# Patient Record
Sex: Female | Born: 1945 | Hispanic: Yes | Smoking: Never smoker
Health system: Southern US, Community
[De-identification: ages and names within clinical notes are randomized; demographics above are authoritative.]

## PROBLEM LIST (undated history)

## (undated) DIAGNOSIS — R011 Cardiac murmur, unspecified: Secondary | ICD-10-CM

## (undated) DIAGNOSIS — R519 Headache, unspecified: Secondary | ICD-10-CM

## (undated) DIAGNOSIS — I10 Essential (primary) hypertension: Secondary | ICD-10-CM

## (undated) DIAGNOSIS — M1990 Unspecified osteoarthritis, unspecified site: Secondary | ICD-10-CM

## (undated) DIAGNOSIS — E079 Disorder of thyroid, unspecified: Secondary | ICD-10-CM

## (undated) HISTORY — DX: Essential (primary) hypertension: I10

## (undated) HISTORY — PX: PILONIDAL CYST EXCISION: SHX744

## (undated) HISTORY — PX: TONSILLECTOMY: SUR1361

## (undated) HISTORY — DX: Disorder of thyroid, unspecified: E07.9

## (undated) HISTORY — PX: BREAST EXCISIONAL BIOPSY: SUR124

## (undated) HISTORY — PX: CARPAL TUNNEL RELEASE: SHX101

## (undated) HISTORY — PX: THYROID CYST EXCISION: SHX2511

---

## 2009-04-25 ENCOUNTER — Encounter: Admission: RE | Admit: 2009-04-25 | Discharge: 2009-04-25 | Payer: Self-pay | Admitting: Obstetrics and Gynecology

## 2010-07-30 ENCOUNTER — Encounter: Admission: RE | Admit: 2010-07-30 | Discharge: 2010-07-30 | Payer: Self-pay | Admitting: Obstetrics and Gynecology

## 2010-10-11 ENCOUNTER — Encounter: Admission: RE | Admit: 2010-10-11 | Discharge: 2010-10-11 | Payer: Self-pay | Admitting: Obstetrics and Gynecology

## 2010-10-22 ENCOUNTER — Encounter
Admission: RE | Admit: 2010-10-22 | Discharge: 2010-10-22 | Payer: Self-pay | Source: Home / Self Care | Attending: Obstetrics and Gynecology | Admitting: Obstetrics and Gynecology

## 2010-10-31 ENCOUNTER — Ambulatory Visit (HOSPITAL_COMMUNITY)
Admission: RE | Admit: 2010-10-31 | Discharge: 2010-10-31 | Payer: Self-pay | Source: Home / Self Care | Attending: Surgery | Admitting: Surgery

## 2011-01-21 LAB — BASIC METABOLIC PANEL
CO2: 30 mEq/L (ref 19–32)
Creatinine, Ser: 0.69 mg/dL (ref 0.4–1.2)
GFR calc Af Amer: >60 mL/min (ref >60)
GFR calc non Af Amer: >60 mL/min (ref >60)
Glucose, Bld: 94 mg/dL (ref 70–99)
Sodium: 145 mEq/L (ref 135–145)

## 2011-01-21 LAB — CBC
Hemoglobin: 13.9 g/dL (ref 12.0–15.0)
WBC: 3.7 10*3/uL — ABNORMAL LOW (ref 4.0–10.5)

## 2011-06-27 ENCOUNTER — Other Ambulatory Visit: Payer: Self-pay | Admitting: Obstetrics and Gynecology

## 2011-06-27 DIAGNOSIS — Z1231 Encounter for screening mammogram for malignant neoplasm of breast: Secondary | ICD-10-CM

## 2011-08-02 ENCOUNTER — Ambulatory Visit
Admission: RE | Admit: 2011-08-02 | Discharge: 2011-08-02 | Disposition: A | Discharge status: Home / Self Care | Payer: Medicare Other | Source: Ambulatory Visit | Attending: Obstetrics and Gynecology | Admitting: Obstetrics and Gynecology

## 2011-08-02 DIAGNOSIS — Z1231 Encounter for screening mammogram for malignant neoplasm of breast: Secondary | ICD-10-CM

## 2011-11-19 DIAGNOSIS — M810 Age-related osteoporosis without current pathological fracture: Secondary | ICD-10-CM | POA: Diagnosis not present

## 2011-12-02 ENCOUNTER — Ambulatory Visit
Admission: RE | Admit: 2011-12-02 | Discharge: 2011-12-02 | Disposition: A | Discharge status: Home / Self Care | Payer: Medicare Other | Source: Ambulatory Visit | Attending: Obstetrics and Gynecology | Admitting: Obstetrics and Gynecology

## 2011-12-02 ENCOUNTER — Other Ambulatory Visit: Payer: Self-pay | Admitting: Obstetrics and Gynecology

## 2011-12-02 DIAGNOSIS — M47814 Spondylosis without myelopathy or radiculopathy, thoracic region: Secondary | ICD-10-CM | POA: Diagnosis not present

## 2011-12-02 DIAGNOSIS — M899 Disorder of bone, unspecified: Secondary | ICD-10-CM | POA: Diagnosis not present

## 2011-12-02 DIAGNOSIS — M7989 Other specified soft tissue disorders: Secondary | ICD-10-CM

## 2011-12-02 DIAGNOSIS — M949 Disorder of cartilage, unspecified: Secondary | ICD-10-CM | POA: Diagnosis not present

## 2011-12-10 DIAGNOSIS — M79609 Pain in unspecified limb: Secondary | ICD-10-CM | POA: Diagnosis not present

## 2011-12-10 DIAGNOSIS — M25579 Pain in unspecified ankle and joints of unspecified foot: Secondary | ICD-10-CM | POA: Diagnosis not present

## 2012-02-04 ENCOUNTER — Other Ambulatory Visit (INDEPENDENT_AMBULATORY_CARE_PROVIDER_SITE_OTHER): Payer: Medicare Other

## 2012-02-04 DIAGNOSIS — E559 Vitamin D deficiency, unspecified: Secondary | ICD-10-CM

## 2012-04-23 DIAGNOSIS — R21 Rash and other nonspecific skin eruption: Secondary | ICD-10-CM | POA: Diagnosis not present

## 2012-04-23 DIAGNOSIS — L255 Unspecified contact dermatitis due to plants, except food: Secondary | ICD-10-CM | POA: Diagnosis not present

## 2012-07-14 ENCOUNTER — Other Ambulatory Visit: Payer: Self-pay | Admitting: Obstetrics and Gynecology

## 2012-07-14 DIAGNOSIS — Z1231 Encounter for screening mammogram for malignant neoplasm of breast: Secondary | ICD-10-CM

## 2012-08-17 ENCOUNTER — Ambulatory Visit
Admission: RE | Admit: 2012-08-17 | Discharge: 2012-08-17 | Disposition: A | Discharge status: Home / Self Care | Payer: Medicare Other | Source: Ambulatory Visit | Attending: Obstetrics and Gynecology | Admitting: Obstetrics and Gynecology

## 2012-08-17 DIAGNOSIS — Z1231 Encounter for screening mammogram for malignant neoplasm of breast: Secondary | ICD-10-CM | POA: Diagnosis not present

## 2012-08-17 DIAGNOSIS — Z23 Encounter for immunization: Secondary | ICD-10-CM | POA: Diagnosis not present

## 2012-08-17 DIAGNOSIS — I10 Essential (primary) hypertension: Secondary | ICD-10-CM | POA: Diagnosis not present

## 2012-08-17 DIAGNOSIS — F411 Generalized anxiety disorder: Secondary | ICD-10-CM | POA: Diagnosis not present

## 2012-08-20 ENCOUNTER — Encounter: Payer: Self-pay | Admitting: Obstetrics and Gynecology

## 2012-09-22 ENCOUNTER — Ambulatory Visit: Payer: Medicare Other | Admitting: Obstetrics and Gynecology

## 2012-10-20 DIAGNOSIS — I10 Essential (primary) hypertension: Secondary | ICD-10-CM | POA: Diagnosis not present

## 2012-10-20 DIAGNOSIS — R079 Chest pain, unspecified: Secondary | ICD-10-CM | POA: Diagnosis not present

## 2012-10-20 DIAGNOSIS — R002 Palpitations: Secondary | ICD-10-CM | POA: Diagnosis not present

## 2012-10-21 ENCOUNTER — Other Ambulatory Visit (HOSPITAL_COMMUNITY): Payer: Self-pay | Admitting: Internal Medicine

## 2012-10-21 DIAGNOSIS — R079 Chest pain, unspecified: Secondary | ICD-10-CM

## 2012-10-21 DIAGNOSIS — R002 Palpitations: Secondary | ICD-10-CM

## 2012-10-21 DIAGNOSIS — I10 Essential (primary) hypertension: Secondary | ICD-10-CM

## 2012-10-27 ENCOUNTER — Ambulatory Visit (HOSPITAL_COMMUNITY)
Admission: RE | Admit: 2012-10-27 | Discharge: 2012-10-27 | Disposition: A | Discharge status: Home / Self Care | Payer: Medicare Other | Source: Ambulatory Visit | Attending: Internal Medicine | Admitting: Internal Medicine

## 2012-10-27 DIAGNOSIS — R002 Palpitations: Secondary | ICD-10-CM | POA: Diagnosis not present

## 2012-10-27 DIAGNOSIS — I10 Essential (primary) hypertension: Secondary | ICD-10-CM

## 2012-10-27 DIAGNOSIS — R079 Chest pain, unspecified: Secondary | ICD-10-CM | POA: Diagnosis not present

## 2012-10-27 DIAGNOSIS — R011 Cardiac murmur, unspecified: Secondary | ICD-10-CM | POA: Diagnosis not present

## 2012-11-02 DIAGNOSIS — I10 Essential (primary) hypertension: Secondary | ICD-10-CM | POA: Diagnosis not present

## 2013-02-15 DIAGNOSIS — I10 Essential (primary) hypertension: Secondary | ICD-10-CM | POA: Diagnosis not present

## 2013-02-15 DIAGNOSIS — B0089 Other herpesviral infection: Secondary | ICD-10-CM | POA: Diagnosis not present

## 2013-02-15 DIAGNOSIS — F411 Generalized anxiety disorder: Secondary | ICD-10-CM | POA: Diagnosis not present

## 2013-02-15 DIAGNOSIS — Z0000 Encounter for general adult medical examination without abnormal findings: Secondary | ICD-10-CM | POA: Diagnosis not present

## 2013-02-15 DIAGNOSIS — E559 Vitamin D deficiency, unspecified: Secondary | ICD-10-CM | POA: Diagnosis not present

## 2013-03-04 ENCOUNTER — Encounter: Payer: Self-pay | Admitting: Cardiovascular Disease

## 2013-06-08 DIAGNOSIS — L989 Disorder of the skin and subcutaneous tissue, unspecified: Secondary | ICD-10-CM | POA: Diagnosis not present

## 2013-06-08 DIAGNOSIS — B86 Scabies: Secondary | ICD-10-CM | POA: Diagnosis not present

## 2013-07-23 DIAGNOSIS — M810 Age-related osteoporosis without current pathological fracture: Secondary | ICD-10-CM | POA: Diagnosis not present

## 2013-07-23 DIAGNOSIS — F411 Generalized anxiety disorder: Secondary | ICD-10-CM | POA: Diagnosis not present

## 2013-07-23 DIAGNOSIS — Z23 Encounter for immunization: Secondary | ICD-10-CM | POA: Diagnosis not present

## 2013-07-23 DIAGNOSIS — I10 Essential (primary) hypertension: Secondary | ICD-10-CM | POA: Diagnosis not present

## 2014-01-20 DIAGNOSIS — Z23 Encounter for immunization: Secondary | ICD-10-CM | POA: Diagnosis not present

## 2014-01-20 DIAGNOSIS — I10 Essential (primary) hypertension: Secondary | ICD-10-CM | POA: Diagnosis not present

## 2014-01-20 DIAGNOSIS — M810 Age-related osteoporosis without current pathological fracture: Secondary | ICD-10-CM | POA: Diagnosis not present

## 2014-01-27 DIAGNOSIS — I10 Essential (primary) hypertension: Secondary | ICD-10-CM | POA: Diagnosis not present

## 2014-01-27 DIAGNOSIS — F411 Generalized anxiety disorder: Secondary | ICD-10-CM | POA: Diagnosis not present

## 2014-01-27 DIAGNOSIS — M810 Age-related osteoporosis without current pathological fracture: Secondary | ICD-10-CM | POA: Diagnosis not present

## 2014-01-27 DIAGNOSIS — J309 Allergic rhinitis, unspecified: Secondary | ICD-10-CM | POA: Diagnosis not present

## 2014-02-22 DIAGNOSIS — Z1231 Encounter for screening mammogram for malignant neoplasm of breast: Secondary | ICD-10-CM | POA: Diagnosis not present

## 2014-02-22 DIAGNOSIS — Z803 Family history of malignant neoplasm of breast: Secondary | ICD-10-CM | POA: Diagnosis not present

## 2014-06-02 DIAGNOSIS — M79609 Pain in unspecified limb: Secondary | ICD-10-CM | POA: Diagnosis not present

## 2014-06-02 DIAGNOSIS — I8310 Varicose veins of unspecified lower extremity with inflammation: Secondary | ICD-10-CM | POA: Diagnosis not present

## 2014-06-09 DIAGNOSIS — M79609 Pain in unspecified limb: Secondary | ICD-10-CM | POA: Diagnosis not present

## 2014-06-09 DIAGNOSIS — I8310 Varicose veins of unspecified lower extremity with inflammation: Secondary | ICD-10-CM | POA: Diagnosis not present

## 2014-06-13 DIAGNOSIS — M79609 Pain in unspecified limb: Secondary | ICD-10-CM | POA: Diagnosis not present

## 2014-06-13 DIAGNOSIS — I8310 Varicose veins of unspecified lower extremity with inflammation: Secondary | ICD-10-CM | POA: Diagnosis not present

## 2014-08-03 DIAGNOSIS — Z23 Encounter for immunization: Secondary | ICD-10-CM | POA: Diagnosis not present

## 2014-08-25 DIAGNOSIS — M810 Age-related osteoporosis without current pathological fracture: Secondary | ICD-10-CM | POA: Diagnosis not present

## 2014-08-25 DIAGNOSIS — I10 Essential (primary) hypertension: Secondary | ICD-10-CM | POA: Diagnosis not present

## 2014-08-31 DIAGNOSIS — M810 Age-related osteoporosis without current pathological fracture: Secondary | ICD-10-CM | POA: Diagnosis not present

## 2014-08-31 DIAGNOSIS — I10 Essential (primary) hypertension: Secondary | ICD-10-CM | POA: Diagnosis not present

## 2014-08-31 DIAGNOSIS — F419 Anxiety disorder, unspecified: Secondary | ICD-10-CM | POA: Diagnosis not present

## 2014-08-31 DIAGNOSIS — J309 Allergic rhinitis, unspecified: Secondary | ICD-10-CM | POA: Diagnosis not present

## 2014-09-08 DIAGNOSIS — M79646 Pain in unspecified finger(s): Secondary | ICD-10-CM | POA: Diagnosis not present

## 2014-09-08 DIAGNOSIS — M65311 Trigger thumb, right thumb: Secondary | ICD-10-CM | POA: Diagnosis not present

## 2014-09-27 DIAGNOSIS — Z01411 Encounter for gynecological examination (general) (routine) with abnormal findings: Secondary | ICD-10-CM | POA: Diagnosis not present

## 2014-09-27 DIAGNOSIS — R3915 Urgency of urination: Secondary | ICD-10-CM | POA: Diagnosis not present

## 2014-09-27 DIAGNOSIS — B009 Herpesviral infection, unspecified: Secondary | ICD-10-CM | POA: Diagnosis not present

## 2014-09-27 DIAGNOSIS — R350 Frequency of micturition: Secondary | ICD-10-CM | POA: Diagnosis not present

## 2014-09-27 DIAGNOSIS — Z124 Encounter for screening for malignant neoplasm of cervix: Secondary | ICD-10-CM | POA: Diagnosis not present

## 2014-11-25 DIAGNOSIS — H04123 Dry eye syndrome of bilateral lacrimal glands: Secondary | ICD-10-CM | POA: Diagnosis not present

## 2014-11-25 DIAGNOSIS — H2513 Age-related nuclear cataract, bilateral: Secondary | ICD-10-CM | POA: Diagnosis not present

## 2015-02-23 DIAGNOSIS — I10 Essential (primary) hypertension: Secondary | ICD-10-CM | POA: Diagnosis not present

## 2015-02-23 DIAGNOSIS — Z0000 Encounter for general adult medical examination without abnormal findings: Secondary | ICD-10-CM | POA: Diagnosis not present

## 2015-02-23 DIAGNOSIS — Z1389 Encounter for screening for other disorder: Secondary | ICD-10-CM | POA: Diagnosis not present

## 2015-02-23 DIAGNOSIS — E785 Hyperlipidemia, unspecified: Secondary | ICD-10-CM | POA: Diagnosis not present

## 2015-02-27 DIAGNOSIS — M810 Age-related osteoporosis without current pathological fracture: Secondary | ICD-10-CM | POA: Diagnosis not present

## 2015-02-27 DIAGNOSIS — I10 Essential (primary) hypertension: Secondary | ICD-10-CM | POA: Diagnosis not present

## 2015-03-02 DIAGNOSIS — M810 Age-related osteoporosis without current pathological fracture: Secondary | ICD-10-CM | POA: Diagnosis not present

## 2015-03-02 DIAGNOSIS — J309 Allergic rhinitis, unspecified: Secondary | ICD-10-CM | POA: Diagnosis not present

## 2015-03-02 DIAGNOSIS — E785 Hyperlipidemia, unspecified: Secondary | ICD-10-CM | POA: Diagnosis not present

## 2015-03-02 DIAGNOSIS — F419 Anxiety disorder, unspecified: Secondary | ICD-10-CM | POA: Diagnosis not present

## 2015-03-02 DIAGNOSIS — I10 Essential (primary) hypertension: Secondary | ICD-10-CM | POA: Diagnosis not present

## 2015-03-23 DIAGNOSIS — Z1231 Encounter for screening mammogram for malignant neoplasm of breast: Secondary | ICD-10-CM | POA: Diagnosis not present

## 2015-03-23 DIAGNOSIS — Z803 Family history of malignant neoplasm of breast: Secondary | ICD-10-CM | POA: Diagnosis not present

## 2015-05-19 DIAGNOSIS — R197 Diarrhea, unspecified: Secondary | ICD-10-CM | POA: Diagnosis not present

## 2015-05-19 DIAGNOSIS — B0089 Other herpesviral infection: Secondary | ICD-10-CM | POA: Diagnosis not present

## 2015-05-19 DIAGNOSIS — K30 Functional dyspepsia: Secondary | ICD-10-CM | POA: Diagnosis not present

## 2015-05-23 DIAGNOSIS — R197 Diarrhea, unspecified: Secondary | ICD-10-CM | POA: Diagnosis not present

## 2015-08-16 DIAGNOSIS — Z23 Encounter for immunization: Secondary | ICD-10-CM | POA: Diagnosis not present

## 2015-08-16 DIAGNOSIS — M1990 Unspecified osteoarthritis, unspecified site: Secondary | ICD-10-CM | POA: Diagnosis not present

## 2015-08-16 DIAGNOSIS — L02429 Furuncle of limb, unspecified: Secondary | ICD-10-CM | POA: Diagnosis not present

## 2015-08-21 DIAGNOSIS — M7701 Medial epicondylitis, right elbow: Secondary | ICD-10-CM | POA: Diagnosis not present

## 2015-08-21 DIAGNOSIS — M6530 Trigger finger, unspecified finger: Secondary | ICD-10-CM | POA: Diagnosis not present

## 2015-08-25 DIAGNOSIS — E785 Hyperlipidemia, unspecified: Secondary | ICD-10-CM | POA: Diagnosis not present

## 2015-08-25 DIAGNOSIS — M810 Age-related osteoporosis without current pathological fracture: Secondary | ICD-10-CM | POA: Diagnosis not present

## 2015-08-25 DIAGNOSIS — I10 Essential (primary) hypertension: Secondary | ICD-10-CM | POA: Diagnosis not present

## 2015-09-01 DIAGNOSIS — D709 Neutropenia, unspecified: Secondary | ICD-10-CM | POA: Diagnosis not present

## 2015-09-01 DIAGNOSIS — E785 Hyperlipidemia, unspecified: Secondary | ICD-10-CM | POA: Diagnosis not present

## 2015-09-01 DIAGNOSIS — N182 Chronic kidney disease, stage 2 (mild): Secondary | ICD-10-CM | POA: Diagnosis not present

## 2015-09-01 DIAGNOSIS — I129 Hypertensive chronic kidney disease with stage 1 through stage 4 chronic kidney disease, or unspecified chronic kidney disease: Secondary | ICD-10-CM | POA: Diagnosis not present

## 2015-10-11 DIAGNOSIS — J069 Acute upper respiratory infection, unspecified: Secondary | ICD-10-CM | POA: Diagnosis not present

## 2015-10-11 DIAGNOSIS — R05 Cough: Secondary | ICD-10-CM | POA: Diagnosis not present

## 2015-10-13 DIAGNOSIS — N951 Menopausal and female climacteric states: Secondary | ICD-10-CM | POA: Diagnosis not present

## 2015-10-13 DIAGNOSIS — Z6826 Body mass index (BMI) 26.0-26.9, adult: Secondary | ICD-10-CM | POA: Diagnosis not present

## 2015-10-13 DIAGNOSIS — B009 Herpesviral infection, unspecified: Secondary | ICD-10-CM | POA: Diagnosis not present

## 2015-10-13 DIAGNOSIS — R3915 Urgency of urination: Secondary | ICD-10-CM | POA: Diagnosis not present

## 2015-10-13 DIAGNOSIS — N9419 Other specified dyspareunia: Secondary | ICD-10-CM | POA: Diagnosis not present

## 2015-10-13 DIAGNOSIS — Z01419 Encounter for gynecological examination (general) (routine) without abnormal findings: Secondary | ICD-10-CM | POA: Diagnosis not present

## 2015-10-13 DIAGNOSIS — N952 Postmenopausal atrophic vaginitis: Secondary | ICD-10-CM | POA: Diagnosis not present

## 2015-11-16 DIAGNOSIS — M545 Low back pain: Secondary | ICD-10-CM | POA: Diagnosis not present

## 2015-11-16 DIAGNOSIS — M62830 Muscle spasm of back: Secondary | ICD-10-CM | POA: Diagnosis not present

## 2015-11-16 DIAGNOSIS — W19XXXA Unspecified fall, initial encounter: Secondary | ICD-10-CM | POA: Diagnosis not present

## 2015-11-16 DIAGNOSIS — R109 Unspecified abdominal pain: Secondary | ICD-10-CM | POA: Diagnosis not present

## 2015-11-16 DIAGNOSIS — S3991XA Unspecified injury of abdomen, initial encounter: Secondary | ICD-10-CM | POA: Diagnosis not present

## 2015-11-23 DIAGNOSIS — N952 Postmenopausal atrophic vaginitis: Secondary | ICD-10-CM | POA: Diagnosis not present

## 2015-11-23 DIAGNOSIS — L659 Nonscarring hair loss, unspecified: Secondary | ICD-10-CM | POA: Diagnosis not present

## 2015-11-23 DIAGNOSIS — R3915 Urgency of urination: Secondary | ICD-10-CM | POA: Diagnosis not present

## 2015-11-30 DIAGNOSIS — M545 Low back pain: Secondary | ICD-10-CM | POA: Diagnosis not present

## 2015-12-15 DIAGNOSIS — L649 Androgenic alopecia, unspecified: Secondary | ICD-10-CM | POA: Diagnosis not present

## 2016-01-24 DIAGNOSIS — M7701 Medial epicondylitis, right elbow: Secondary | ICD-10-CM | POA: Diagnosis not present

## 2016-01-24 DIAGNOSIS — M7551 Bursitis of right shoulder: Secondary | ICD-10-CM | POA: Diagnosis not present

## 2016-01-24 DIAGNOSIS — M6530 Trigger finger, unspecified finger: Secondary | ICD-10-CM | POA: Diagnosis not present

## 2016-01-24 DIAGNOSIS — M1990 Unspecified osteoarthritis, unspecified site: Secondary | ICD-10-CM | POA: Diagnosis not present

## 2016-01-31 DIAGNOSIS — N390 Urinary tract infection, site not specified: Secondary | ICD-10-CM | POA: Diagnosis not present

## 2016-01-31 DIAGNOSIS — R300 Dysuria: Secondary | ICD-10-CM | POA: Diagnosis not present

## 2016-02-07 DIAGNOSIS — M810 Age-related osteoporosis without current pathological fracture: Secondary | ICD-10-CM | POA: Diagnosis not present

## 2016-02-07 DIAGNOSIS — M7552 Bursitis of left shoulder: Secondary | ICD-10-CM | POA: Diagnosis not present

## 2016-02-07 DIAGNOSIS — M1990 Unspecified osteoarthritis, unspecified site: Secondary | ICD-10-CM | POA: Diagnosis not present

## 2016-02-27 DIAGNOSIS — I129 Hypertensive chronic kidney disease with stage 1 through stage 4 chronic kidney disease, or unspecified chronic kidney disease: Secondary | ICD-10-CM | POA: Diagnosis not present

## 2016-02-27 DIAGNOSIS — M810 Age-related osteoporosis without current pathological fracture: Secondary | ICD-10-CM | POA: Diagnosis not present

## 2016-02-27 DIAGNOSIS — Z1389 Encounter for screening for other disorder: Secondary | ICD-10-CM | POA: Diagnosis not present

## 2016-02-27 DIAGNOSIS — E785 Hyperlipidemia, unspecified: Secondary | ICD-10-CM | POA: Diagnosis not present

## 2016-02-27 DIAGNOSIS — E663 Overweight: Secondary | ICD-10-CM | POA: Diagnosis not present

## 2016-02-27 DIAGNOSIS — Z0000 Encounter for general adult medical examination without abnormal findings: Secondary | ICD-10-CM | POA: Diagnosis not present

## 2016-03-05 DIAGNOSIS — R350 Frequency of micturition: Secondary | ICD-10-CM | POA: Diagnosis not present

## 2016-03-05 DIAGNOSIS — E785 Hyperlipidemia, unspecified: Secondary | ICD-10-CM | POA: Diagnosis not present

## 2016-03-05 DIAGNOSIS — N182 Chronic kidney disease, stage 2 (mild): Secondary | ICD-10-CM | POA: Diagnosis not present

## 2016-03-05 DIAGNOSIS — D709 Neutropenia, unspecified: Secondary | ICD-10-CM | POA: Diagnosis not present

## 2016-03-05 DIAGNOSIS — M810 Age-related osteoporosis without current pathological fracture: Secondary | ICD-10-CM | POA: Diagnosis not present

## 2016-03-05 DIAGNOSIS — I129 Hypertensive chronic kidney disease with stage 1 through stage 4 chronic kidney disease, or unspecified chronic kidney disease: Secondary | ICD-10-CM | POA: Diagnosis not present

## 2016-06-18 DIAGNOSIS — M19012 Primary osteoarthritis, left shoulder: Secondary | ICD-10-CM | POA: Diagnosis not present

## 2016-06-18 DIAGNOSIS — M7552 Bursitis of left shoulder: Secondary | ICD-10-CM | POA: Diagnosis not present

## 2016-06-18 DIAGNOSIS — G5600 Carpal tunnel syndrome, unspecified upper limb: Secondary | ICD-10-CM | POA: Diagnosis not present

## 2016-06-18 DIAGNOSIS — M19041 Primary osteoarthritis, right hand: Secondary | ICD-10-CM | POA: Diagnosis not present

## 2016-06-18 DIAGNOSIS — M1990 Unspecified osteoarthritis, unspecified site: Secondary | ICD-10-CM | POA: Diagnosis not present

## 2016-06-18 DIAGNOSIS — M19011 Primary osteoarthritis, right shoulder: Secondary | ICD-10-CM | POA: Diagnosis not present

## 2016-06-18 DIAGNOSIS — M19042 Primary osteoarthritis, left hand: Secondary | ICD-10-CM | POA: Diagnosis not present

## 2016-06-18 DIAGNOSIS — M5032 Other cervical disc degeneration, mid-cervical region, unspecified level: Secondary | ICD-10-CM | POA: Diagnosis not present

## 2016-06-25 DIAGNOSIS — M25511 Pain in right shoulder: Secondary | ICD-10-CM | POA: Diagnosis not present

## 2016-06-25 DIAGNOSIS — M25512 Pain in left shoulder: Secondary | ICD-10-CM | POA: Diagnosis not present

## 2016-06-25 DIAGNOSIS — G5600 Carpal tunnel syndrome, unspecified upper limb: Secondary | ICD-10-CM | POA: Diagnosis not present

## 2016-06-25 DIAGNOSIS — M1990 Unspecified osteoarthritis, unspecified site: Secondary | ICD-10-CM | POA: Diagnosis not present

## 2016-06-25 DIAGNOSIS — M7552 Bursitis of left shoulder: Secondary | ICD-10-CM | POA: Diagnosis not present

## 2016-06-26 DIAGNOSIS — H04123 Dry eye syndrome of bilateral lacrimal glands: Secondary | ICD-10-CM | POA: Diagnosis not present

## 2016-06-26 DIAGNOSIS — H2513 Age-related nuclear cataract, bilateral: Secondary | ICD-10-CM | POA: Diagnosis not present

## 2016-07-25 ENCOUNTER — Ambulatory Visit (INDEPENDENT_AMBULATORY_CARE_PROVIDER_SITE_OTHER): Payer: Self-pay | Admitting: Neurology

## 2016-07-25 ENCOUNTER — Ambulatory Visit (INDEPENDENT_AMBULATORY_CARE_PROVIDER_SITE_OTHER): Payer: Medicare Other | Admitting: Neurology

## 2016-07-25 DIAGNOSIS — M542 Cervicalgia: Secondary | ICD-10-CM

## 2016-07-25 DIAGNOSIS — M25512 Pain in left shoulder: Principal | ICD-10-CM

## 2016-07-25 DIAGNOSIS — Z0289 Encounter for other administrative examinations: Secondary | ICD-10-CM

## 2016-07-25 DIAGNOSIS — M25511 Pain in right shoulder: Secondary | ICD-10-CM | POA: Insufficient documentation

## 2016-07-25 NOTE — Progress Notes (Signed)
EMG nerve conduction study report on the procedure section

## 2016-07-25 NOTE — Procedures (Signed)
   NCS (NERVE CONDUCTION STUDY) WITH EMG (ELECTROMYOGRAPHY) REPORT   STUDY DATE: July 25 2016 PATIENT NAME: Tracy Horne DOB: 1945-11-27 MRN: [id number]    TECHNOLOGIST: Laretta Alstrom ELECTROMYOGRAPHER: Marcial Pacas M.D.  CLINICAL INFORMATION:  70 years old right-handed female presented with intermittent neck pain, bilateral shoulder pain  On examination: Bilateral upper extremity motor examinations were normal, deep tendon reflexes were normal and symmetric.  FINDINGS: NERVE CONDUCTION STUDY: Bilateral median, ulnar sensory and motor responses were normal.  NEEDLE ELECTROMYOGRAPHY: Selective needle examinations were performed at bilateral upper extremity muscles, and bilateral cervical paraspinal muscles.  Needle examination of bilateral biceps, triceps, deltoid, pronator teres, right extensor digitorum communis were normal.  There was no spontaneous activity at bilateral cervical paraspinal muscles, C5, C6, C7.  IMPRESSION:  This is a normal study, there is no electrodiagnostic study of bilateral cervical radiculopathy or bilateral upper extremity neuropathy.   INTERPRETING PHYSICIAN:   Marcial Pacas M.D. Ph.D. [facility] [address] [place], [place] [address] [phone]

## 2016-07-26 DIAGNOSIS — Z23 Encounter for immunization: Secondary | ICD-10-CM | POA: Diagnosis not present

## 2016-07-29 DIAGNOSIS — M7552 Bursitis of left shoulder: Secondary | ICD-10-CM | POA: Diagnosis not present

## 2016-07-29 DIAGNOSIS — M1990 Unspecified osteoarthritis, unspecified site: Secondary | ICD-10-CM | POA: Diagnosis not present

## 2016-07-29 DIAGNOSIS — M545 Low back pain: Secondary | ICD-10-CM | POA: Diagnosis not present

## 2016-08-28 DIAGNOSIS — E785 Hyperlipidemia, unspecified: Secondary | ICD-10-CM | POA: Diagnosis not present

## 2016-08-28 DIAGNOSIS — E559 Vitamin D deficiency, unspecified: Secondary | ICD-10-CM | POA: Diagnosis not present

## 2016-08-28 DIAGNOSIS — I129 Hypertensive chronic kidney disease with stage 1 through stage 4 chronic kidney disease, or unspecified chronic kidney disease: Secondary | ICD-10-CM | POA: Diagnosis not present

## 2016-08-28 DIAGNOSIS — I10 Essential (primary) hypertension: Secondary | ICD-10-CM | POA: Diagnosis not present

## 2016-08-28 DIAGNOSIS — M810 Age-related osteoporosis without current pathological fracture: Secondary | ICD-10-CM | POA: Diagnosis not present

## 2016-09-04 DIAGNOSIS — E7800 Pure hypercholesterolemia, unspecified: Secondary | ICD-10-CM | POA: Diagnosis not present

## 2016-09-04 DIAGNOSIS — M810 Age-related osteoporosis without current pathological fracture: Secondary | ICD-10-CM | POA: Diagnosis not present

## 2016-09-04 DIAGNOSIS — I10 Essential (primary) hypertension: Secondary | ICD-10-CM | POA: Diagnosis not present

## 2016-09-04 DIAGNOSIS — R002 Palpitations: Secondary | ICD-10-CM | POA: Diagnosis not present

## 2016-09-17 DIAGNOSIS — M810 Age-related osteoporosis without current pathological fracture: Secondary | ICD-10-CM | POA: Diagnosis not present

## 2016-09-20 DIAGNOSIS — I10 Essential (primary) hypertension: Secondary | ICD-10-CM | POA: Diagnosis not present

## 2016-09-20 DIAGNOSIS — E7800 Pure hypercholesterolemia, unspecified: Secondary | ICD-10-CM | POA: Diagnosis not present

## 2016-09-20 DIAGNOSIS — R002 Palpitations: Secondary | ICD-10-CM | POA: Diagnosis not present

## 2016-10-15 DIAGNOSIS — J329 Chronic sinusitis, unspecified: Secondary | ICD-10-CM | POA: Diagnosis not present

## 2016-11-06 DIAGNOSIS — M1990 Unspecified osteoarthritis, unspecified site: Secondary | ICD-10-CM | POA: Diagnosis not present

## 2016-11-06 DIAGNOSIS — M25511 Pain in right shoulder: Secondary | ICD-10-CM | POA: Diagnosis not present

## 2016-11-06 DIAGNOSIS — M7552 Bursitis of left shoulder: Secondary | ICD-10-CM | POA: Diagnosis not present

## 2016-11-06 DIAGNOSIS — R768 Other specified abnormal immunological findings in serum: Secondary | ICD-10-CM | POA: Diagnosis not present

## 2016-11-06 DIAGNOSIS — M545 Low back pain: Secondary | ICD-10-CM | POA: Diagnosis not present

## 2017-01-17 DIAGNOSIS — M9903 Segmental and somatic dysfunction of lumbar region: Secondary | ICD-10-CM | POA: Diagnosis not present

## 2017-01-17 DIAGNOSIS — M9902 Segmental and somatic dysfunction of thoracic region: Secondary | ICD-10-CM | POA: Diagnosis not present

## 2017-01-17 DIAGNOSIS — M6240 Contracture of muscle, unspecified site: Secondary | ICD-10-CM | POA: Diagnosis not present

## 2017-01-17 DIAGNOSIS — M791 Myalgia: Secondary | ICD-10-CM | POA: Diagnosis not present

## 2017-01-17 DIAGNOSIS — Z72820 Sleep deprivation: Secondary | ICD-10-CM | POA: Diagnosis not present

## 2017-01-17 DIAGNOSIS — M9901 Segmental and somatic dysfunction of cervical region: Secondary | ICD-10-CM | POA: Diagnosis not present

## 2017-01-17 DIAGNOSIS — M75100 Unspecified rotator cuff tear or rupture of unspecified shoulder, not specified as traumatic: Secondary | ICD-10-CM | POA: Diagnosis not present

## 2017-01-17 DIAGNOSIS — M9905 Segmental and somatic dysfunction of pelvic region: Secondary | ICD-10-CM | POA: Diagnosis not present

## 2017-01-17 DIAGNOSIS — G540 Brachial plexus disorders: Secondary | ICD-10-CM | POA: Diagnosis not present

## 2017-01-20 DIAGNOSIS — M75100 Unspecified rotator cuff tear or rupture of unspecified shoulder, not specified as traumatic: Secondary | ICD-10-CM | POA: Diagnosis not present

## 2017-01-20 DIAGNOSIS — M5430 Sciatica, unspecified side: Secondary | ICD-10-CM | POA: Diagnosis not present

## 2017-01-20 DIAGNOSIS — M9901 Segmental and somatic dysfunction of cervical region: Secondary | ICD-10-CM | POA: Diagnosis not present

## 2017-01-20 DIAGNOSIS — M791 Myalgia: Secondary | ICD-10-CM | POA: Diagnosis not present

## 2017-01-20 DIAGNOSIS — M9903 Segmental and somatic dysfunction of lumbar region: Secondary | ICD-10-CM | POA: Diagnosis not present

## 2017-01-20 DIAGNOSIS — M9905 Segmental and somatic dysfunction of pelvic region: Secondary | ICD-10-CM | POA: Diagnosis not present

## 2017-01-20 DIAGNOSIS — M6240 Contracture of muscle, unspecified site: Secondary | ICD-10-CM | POA: Diagnosis not present

## 2017-01-20 DIAGNOSIS — M9902 Segmental and somatic dysfunction of thoracic region: Secondary | ICD-10-CM | POA: Diagnosis not present

## 2017-01-20 DIAGNOSIS — G5600 Carpal tunnel syndrome, unspecified upper limb: Secondary | ICD-10-CM | POA: Diagnosis not present

## 2017-01-20 DIAGNOSIS — G540 Brachial plexus disorders: Secondary | ICD-10-CM | POA: Diagnosis not present

## 2017-01-20 DIAGNOSIS — Z72820 Sleep deprivation: Secondary | ICD-10-CM | POA: Diagnosis not present

## 2017-01-22 DIAGNOSIS — G540 Brachial plexus disorders: Secondary | ICD-10-CM | POA: Diagnosis not present

## 2017-01-22 DIAGNOSIS — M791 Myalgia: Secondary | ICD-10-CM | POA: Diagnosis not present

## 2017-01-22 DIAGNOSIS — M9905 Segmental and somatic dysfunction of pelvic region: Secondary | ICD-10-CM | POA: Diagnosis not present

## 2017-01-22 DIAGNOSIS — M75100 Unspecified rotator cuff tear or rupture of unspecified shoulder, not specified as traumatic: Secondary | ICD-10-CM | POA: Diagnosis not present

## 2017-01-22 DIAGNOSIS — M9903 Segmental and somatic dysfunction of lumbar region: Secondary | ICD-10-CM | POA: Diagnosis not present

## 2017-01-22 DIAGNOSIS — M6240 Contracture of muscle, unspecified site: Secondary | ICD-10-CM | POA: Diagnosis not present

## 2017-01-22 DIAGNOSIS — M5430 Sciatica, unspecified side: Secondary | ICD-10-CM | POA: Diagnosis not present

## 2017-01-22 DIAGNOSIS — G5600 Carpal tunnel syndrome, unspecified upper limb: Secondary | ICD-10-CM | POA: Diagnosis not present

## 2017-01-22 DIAGNOSIS — M9901 Segmental and somatic dysfunction of cervical region: Secondary | ICD-10-CM | POA: Diagnosis not present

## 2017-01-22 DIAGNOSIS — M9902 Segmental and somatic dysfunction of thoracic region: Secondary | ICD-10-CM | POA: Diagnosis not present

## 2017-01-22 DIAGNOSIS — Z72820 Sleep deprivation: Secondary | ICD-10-CM | POA: Diagnosis not present

## 2017-01-27 DIAGNOSIS — G540 Brachial plexus disorders: Secondary | ICD-10-CM | POA: Diagnosis not present

## 2017-01-27 DIAGNOSIS — M9902 Segmental and somatic dysfunction of thoracic region: Secondary | ICD-10-CM | POA: Diagnosis not present

## 2017-01-27 DIAGNOSIS — M9905 Segmental and somatic dysfunction of pelvic region: Secondary | ICD-10-CM | POA: Diagnosis not present

## 2017-01-27 DIAGNOSIS — M9903 Segmental and somatic dysfunction of lumbar region: Secondary | ICD-10-CM | POA: Diagnosis not present

## 2017-01-27 DIAGNOSIS — Z72820 Sleep deprivation: Secondary | ICD-10-CM | POA: Diagnosis not present

## 2017-01-27 DIAGNOSIS — G5600 Carpal tunnel syndrome, unspecified upper limb: Secondary | ICD-10-CM | POA: Diagnosis not present

## 2017-01-27 DIAGNOSIS — M75100 Unspecified rotator cuff tear or rupture of unspecified shoulder, not specified as traumatic: Secondary | ICD-10-CM | POA: Diagnosis not present

## 2017-01-27 DIAGNOSIS — M5430 Sciatica, unspecified side: Secondary | ICD-10-CM | POA: Diagnosis not present

## 2017-01-27 DIAGNOSIS — M791 Myalgia: Secondary | ICD-10-CM | POA: Diagnosis not present

## 2017-01-27 DIAGNOSIS — M9901 Segmental and somatic dysfunction of cervical region: Secondary | ICD-10-CM | POA: Diagnosis not present

## 2017-01-27 DIAGNOSIS — M6240 Contracture of muscle, unspecified site: Secondary | ICD-10-CM | POA: Diagnosis not present

## 2017-01-29 DIAGNOSIS — M9903 Segmental and somatic dysfunction of lumbar region: Secondary | ICD-10-CM | POA: Diagnosis not present

## 2017-01-29 DIAGNOSIS — M9901 Segmental and somatic dysfunction of cervical region: Secondary | ICD-10-CM | POA: Diagnosis not present

## 2017-01-29 DIAGNOSIS — G5600 Carpal tunnel syndrome, unspecified upper limb: Secondary | ICD-10-CM | POA: Diagnosis not present

## 2017-01-29 DIAGNOSIS — M791 Myalgia: Secondary | ICD-10-CM | POA: Diagnosis not present

## 2017-01-29 DIAGNOSIS — M6240 Contracture of muscle, unspecified site: Secondary | ICD-10-CM | POA: Diagnosis not present

## 2017-01-29 DIAGNOSIS — M75100 Unspecified rotator cuff tear or rupture of unspecified shoulder, not specified as traumatic: Secondary | ICD-10-CM | POA: Diagnosis not present

## 2017-01-29 DIAGNOSIS — M9905 Segmental and somatic dysfunction of pelvic region: Secondary | ICD-10-CM | POA: Diagnosis not present

## 2017-01-29 DIAGNOSIS — Z72820 Sleep deprivation: Secondary | ICD-10-CM | POA: Diagnosis not present

## 2017-01-29 DIAGNOSIS — G540 Brachial plexus disorders: Secondary | ICD-10-CM | POA: Diagnosis not present

## 2017-01-29 DIAGNOSIS — M9902 Segmental and somatic dysfunction of thoracic region: Secondary | ICD-10-CM | POA: Diagnosis not present

## 2017-01-29 DIAGNOSIS — M5430 Sciatica, unspecified side: Secondary | ICD-10-CM | POA: Diagnosis not present

## 2017-02-07 DIAGNOSIS — M6240 Contracture of muscle, unspecified site: Secondary | ICD-10-CM | POA: Diagnosis not present

## 2017-02-07 DIAGNOSIS — M9903 Segmental and somatic dysfunction of lumbar region: Secondary | ICD-10-CM | POA: Diagnosis not present

## 2017-02-07 DIAGNOSIS — G5600 Carpal tunnel syndrome, unspecified upper limb: Secondary | ICD-10-CM | POA: Diagnosis not present

## 2017-02-07 DIAGNOSIS — M791 Myalgia: Secondary | ICD-10-CM | POA: Diagnosis not present

## 2017-02-07 DIAGNOSIS — M9905 Segmental and somatic dysfunction of pelvic region: Secondary | ICD-10-CM | POA: Diagnosis not present

## 2017-02-07 DIAGNOSIS — M5430 Sciatica, unspecified side: Secondary | ICD-10-CM | POA: Diagnosis not present

## 2017-02-07 DIAGNOSIS — M75100 Unspecified rotator cuff tear or rupture of unspecified shoulder, not specified as traumatic: Secondary | ICD-10-CM | POA: Diagnosis not present

## 2017-02-07 DIAGNOSIS — G540 Brachial plexus disorders: Secondary | ICD-10-CM | POA: Diagnosis not present

## 2017-02-07 DIAGNOSIS — M9902 Segmental and somatic dysfunction of thoracic region: Secondary | ICD-10-CM | POA: Diagnosis not present

## 2017-02-07 DIAGNOSIS — M9901 Segmental and somatic dysfunction of cervical region: Secondary | ICD-10-CM | POA: Diagnosis not present

## 2017-02-07 DIAGNOSIS — Z72820 Sleep deprivation: Secondary | ICD-10-CM | POA: Diagnosis not present

## 2017-02-10 DIAGNOSIS — M75100 Unspecified rotator cuff tear or rupture of unspecified shoulder, not specified as traumatic: Secondary | ICD-10-CM | POA: Diagnosis not present

## 2017-02-10 DIAGNOSIS — G540 Brachial plexus disorders: Secondary | ICD-10-CM | POA: Diagnosis not present

## 2017-02-10 DIAGNOSIS — J019 Acute sinusitis, unspecified: Secondary | ICD-10-CM | POA: Diagnosis not present

## 2017-02-10 DIAGNOSIS — M9901 Segmental and somatic dysfunction of cervical region: Secondary | ICD-10-CM | POA: Diagnosis not present

## 2017-02-10 DIAGNOSIS — Z72820 Sleep deprivation: Secondary | ICD-10-CM | POA: Diagnosis not present

## 2017-02-10 DIAGNOSIS — J209 Acute bronchitis, unspecified: Secondary | ICD-10-CM | POA: Diagnosis not present

## 2017-02-10 DIAGNOSIS — M5430 Sciatica, unspecified side: Secondary | ICD-10-CM | POA: Diagnosis not present

## 2017-02-10 DIAGNOSIS — M9903 Segmental and somatic dysfunction of lumbar region: Secondary | ICD-10-CM | POA: Diagnosis not present

## 2017-02-10 DIAGNOSIS — M9905 Segmental and somatic dysfunction of pelvic region: Secondary | ICD-10-CM | POA: Diagnosis not present

## 2017-02-10 DIAGNOSIS — M6240 Contracture of muscle, unspecified site: Secondary | ICD-10-CM | POA: Diagnosis not present

## 2017-02-10 DIAGNOSIS — M791 Myalgia: Secondary | ICD-10-CM | POA: Diagnosis not present

## 2017-02-10 DIAGNOSIS — G5600 Carpal tunnel syndrome, unspecified upper limb: Secondary | ICD-10-CM | POA: Diagnosis not present

## 2017-02-10 DIAGNOSIS — M9902 Segmental and somatic dysfunction of thoracic region: Secondary | ICD-10-CM | POA: Diagnosis not present

## 2017-02-12 DIAGNOSIS — G5600 Carpal tunnel syndrome, unspecified upper limb: Secondary | ICD-10-CM | POA: Diagnosis not present

## 2017-02-12 DIAGNOSIS — M9902 Segmental and somatic dysfunction of thoracic region: Secondary | ICD-10-CM | POA: Diagnosis not present

## 2017-02-12 DIAGNOSIS — G540 Brachial plexus disorders: Secondary | ICD-10-CM | POA: Diagnosis not present

## 2017-02-12 DIAGNOSIS — M9903 Segmental and somatic dysfunction of lumbar region: Secondary | ICD-10-CM | POA: Diagnosis not present

## 2017-02-12 DIAGNOSIS — M5430 Sciatica, unspecified side: Secondary | ICD-10-CM | POA: Diagnosis not present

## 2017-02-12 DIAGNOSIS — Z72820 Sleep deprivation: Secondary | ICD-10-CM | POA: Diagnosis not present

## 2017-02-12 DIAGNOSIS — M75100 Unspecified rotator cuff tear or rupture of unspecified shoulder, not specified as traumatic: Secondary | ICD-10-CM | POA: Diagnosis not present

## 2017-02-12 DIAGNOSIS — M791 Myalgia: Secondary | ICD-10-CM | POA: Diagnosis not present

## 2017-02-12 DIAGNOSIS — M6240 Contracture of muscle, unspecified site: Secondary | ICD-10-CM | POA: Diagnosis not present

## 2017-02-12 DIAGNOSIS — M9905 Segmental and somatic dysfunction of pelvic region: Secondary | ICD-10-CM | POA: Diagnosis not present

## 2017-02-12 DIAGNOSIS — M9901 Segmental and somatic dysfunction of cervical region: Secondary | ICD-10-CM | POA: Diagnosis not present

## 2017-02-14 DIAGNOSIS — M6240 Contracture of muscle, unspecified site: Secondary | ICD-10-CM | POA: Diagnosis not present

## 2017-02-14 DIAGNOSIS — M791 Myalgia: Secondary | ICD-10-CM | POA: Diagnosis not present

## 2017-02-14 DIAGNOSIS — G5600 Carpal tunnel syndrome, unspecified upper limb: Secondary | ICD-10-CM | POA: Diagnosis not present

## 2017-02-14 DIAGNOSIS — M75100 Unspecified rotator cuff tear or rupture of unspecified shoulder, not specified as traumatic: Secondary | ICD-10-CM | POA: Diagnosis not present

## 2017-02-14 DIAGNOSIS — M9902 Segmental and somatic dysfunction of thoracic region: Secondary | ICD-10-CM | POA: Diagnosis not present

## 2017-02-14 DIAGNOSIS — Z72820 Sleep deprivation: Secondary | ICD-10-CM | POA: Diagnosis not present

## 2017-02-14 DIAGNOSIS — M9905 Segmental and somatic dysfunction of pelvic region: Secondary | ICD-10-CM | POA: Diagnosis not present

## 2017-02-14 DIAGNOSIS — M5430 Sciatica, unspecified side: Secondary | ICD-10-CM | POA: Diagnosis not present

## 2017-02-14 DIAGNOSIS — M9901 Segmental and somatic dysfunction of cervical region: Secondary | ICD-10-CM | POA: Diagnosis not present

## 2017-02-14 DIAGNOSIS — M9903 Segmental and somatic dysfunction of lumbar region: Secondary | ICD-10-CM | POA: Diagnosis not present

## 2017-02-14 DIAGNOSIS — G540 Brachial plexus disorders: Secondary | ICD-10-CM | POA: Diagnosis not present

## 2017-02-17 DIAGNOSIS — M791 Myalgia: Secondary | ICD-10-CM | POA: Diagnosis not present

## 2017-02-17 DIAGNOSIS — M9905 Segmental and somatic dysfunction of pelvic region: Secondary | ICD-10-CM | POA: Diagnosis not present

## 2017-02-17 DIAGNOSIS — M9903 Segmental and somatic dysfunction of lumbar region: Secondary | ICD-10-CM | POA: Diagnosis not present

## 2017-02-17 DIAGNOSIS — M9902 Segmental and somatic dysfunction of thoracic region: Secondary | ICD-10-CM | POA: Diagnosis not present

## 2017-02-17 DIAGNOSIS — M6240 Contracture of muscle, unspecified site: Secondary | ICD-10-CM | POA: Diagnosis not present

## 2017-02-17 DIAGNOSIS — M75100 Unspecified rotator cuff tear or rupture of unspecified shoulder, not specified as traumatic: Secondary | ICD-10-CM | POA: Diagnosis not present

## 2017-02-17 DIAGNOSIS — M5430 Sciatica, unspecified side: Secondary | ICD-10-CM | POA: Diagnosis not present

## 2017-02-17 DIAGNOSIS — G5600 Carpal tunnel syndrome, unspecified upper limb: Secondary | ICD-10-CM | POA: Diagnosis not present

## 2017-02-17 DIAGNOSIS — Z72820 Sleep deprivation: Secondary | ICD-10-CM | POA: Diagnosis not present

## 2017-02-17 DIAGNOSIS — G540 Brachial plexus disorders: Secondary | ICD-10-CM | POA: Diagnosis not present

## 2017-02-17 DIAGNOSIS — M9901 Segmental and somatic dysfunction of cervical region: Secondary | ICD-10-CM | POA: Diagnosis not present

## 2017-02-21 DIAGNOSIS — G540 Brachial plexus disorders: Secondary | ICD-10-CM | POA: Diagnosis not present

## 2017-02-21 DIAGNOSIS — Z72820 Sleep deprivation: Secondary | ICD-10-CM | POA: Diagnosis not present

## 2017-02-21 DIAGNOSIS — M6240 Contracture of muscle, unspecified site: Secondary | ICD-10-CM | POA: Diagnosis not present

## 2017-02-21 DIAGNOSIS — M75100 Unspecified rotator cuff tear or rupture of unspecified shoulder, not specified as traumatic: Secondary | ICD-10-CM | POA: Diagnosis not present

## 2017-02-21 DIAGNOSIS — M9902 Segmental and somatic dysfunction of thoracic region: Secondary | ICD-10-CM | POA: Diagnosis not present

## 2017-02-21 DIAGNOSIS — M791 Myalgia: Secondary | ICD-10-CM | POA: Diagnosis not present

## 2017-02-21 DIAGNOSIS — G5600 Carpal tunnel syndrome, unspecified upper limb: Secondary | ICD-10-CM | POA: Diagnosis not present

## 2017-02-21 DIAGNOSIS — M9901 Segmental and somatic dysfunction of cervical region: Secondary | ICD-10-CM | POA: Diagnosis not present

## 2017-02-21 DIAGNOSIS — M5430 Sciatica, unspecified side: Secondary | ICD-10-CM | POA: Diagnosis not present

## 2017-02-21 DIAGNOSIS — M9903 Segmental and somatic dysfunction of lumbar region: Secondary | ICD-10-CM | POA: Diagnosis not present

## 2017-02-21 DIAGNOSIS — M9905 Segmental and somatic dysfunction of pelvic region: Secondary | ICD-10-CM | POA: Diagnosis not present

## 2017-02-26 DIAGNOSIS — M75100 Unspecified rotator cuff tear or rupture of unspecified shoulder, not specified as traumatic: Secondary | ICD-10-CM | POA: Diagnosis not present

## 2017-02-26 DIAGNOSIS — M6240 Contracture of muscle, unspecified site: Secondary | ICD-10-CM | POA: Diagnosis not present

## 2017-02-26 DIAGNOSIS — M5430 Sciatica, unspecified side: Secondary | ICD-10-CM | POA: Diagnosis not present

## 2017-02-26 DIAGNOSIS — M9905 Segmental and somatic dysfunction of pelvic region: Secondary | ICD-10-CM | POA: Diagnosis not present

## 2017-02-26 DIAGNOSIS — Z72820 Sleep deprivation: Secondary | ICD-10-CM | POA: Diagnosis not present

## 2017-02-26 DIAGNOSIS — M9902 Segmental and somatic dysfunction of thoracic region: Secondary | ICD-10-CM | POA: Diagnosis not present

## 2017-02-26 DIAGNOSIS — M9901 Segmental and somatic dysfunction of cervical region: Secondary | ICD-10-CM | POA: Diagnosis not present

## 2017-02-26 DIAGNOSIS — G5600 Carpal tunnel syndrome, unspecified upper limb: Secondary | ICD-10-CM | POA: Diagnosis not present

## 2017-02-26 DIAGNOSIS — M9903 Segmental and somatic dysfunction of lumbar region: Secondary | ICD-10-CM | POA: Diagnosis not present

## 2017-02-26 DIAGNOSIS — G540 Brachial plexus disorders: Secondary | ICD-10-CM | POA: Diagnosis not present

## 2017-02-26 DIAGNOSIS — M791 Myalgia: Secondary | ICD-10-CM | POA: Diagnosis not present

## 2017-03-03 DIAGNOSIS — G5600 Carpal tunnel syndrome, unspecified upper limb: Secondary | ICD-10-CM | POA: Diagnosis not present

## 2017-03-03 DIAGNOSIS — M9903 Segmental and somatic dysfunction of lumbar region: Secondary | ICD-10-CM | POA: Diagnosis not present

## 2017-03-03 DIAGNOSIS — M6240 Contracture of muscle, unspecified site: Secondary | ICD-10-CM | POA: Diagnosis not present

## 2017-03-03 DIAGNOSIS — Z72820 Sleep deprivation: Secondary | ICD-10-CM | POA: Diagnosis not present

## 2017-03-03 DIAGNOSIS — M75100 Unspecified rotator cuff tear or rupture of unspecified shoulder, not specified as traumatic: Secondary | ICD-10-CM | POA: Diagnosis not present

## 2017-03-03 DIAGNOSIS — M5430 Sciatica, unspecified side: Secondary | ICD-10-CM | POA: Diagnosis not present

## 2017-03-03 DIAGNOSIS — M9905 Segmental and somatic dysfunction of pelvic region: Secondary | ICD-10-CM | POA: Diagnosis not present

## 2017-03-03 DIAGNOSIS — M9901 Segmental and somatic dysfunction of cervical region: Secondary | ICD-10-CM | POA: Diagnosis not present

## 2017-03-03 DIAGNOSIS — G540 Brachial plexus disorders: Secondary | ICD-10-CM | POA: Diagnosis not present

## 2017-03-03 DIAGNOSIS — M9902 Segmental and somatic dysfunction of thoracic region: Secondary | ICD-10-CM | POA: Diagnosis not present

## 2017-03-03 DIAGNOSIS — M791 Myalgia: Secondary | ICD-10-CM | POA: Diagnosis not present

## 2017-03-19 DIAGNOSIS — G540 Brachial plexus disorders: Secondary | ICD-10-CM | POA: Diagnosis not present

## 2017-03-19 DIAGNOSIS — M9903 Segmental and somatic dysfunction of lumbar region: Secondary | ICD-10-CM | POA: Diagnosis not present

## 2017-03-19 DIAGNOSIS — M9902 Segmental and somatic dysfunction of thoracic region: Secondary | ICD-10-CM | POA: Diagnosis not present

## 2017-03-19 DIAGNOSIS — M9905 Segmental and somatic dysfunction of pelvic region: Secondary | ICD-10-CM | POA: Diagnosis not present

## 2017-03-19 DIAGNOSIS — M5430 Sciatica, unspecified side: Secondary | ICD-10-CM | POA: Diagnosis not present

## 2017-03-19 DIAGNOSIS — Z72820 Sleep deprivation: Secondary | ICD-10-CM | POA: Diagnosis not present

## 2017-03-19 DIAGNOSIS — M9901 Segmental and somatic dysfunction of cervical region: Secondary | ICD-10-CM | POA: Diagnosis not present

## 2017-03-19 DIAGNOSIS — M6240 Contracture of muscle, unspecified site: Secondary | ICD-10-CM | POA: Diagnosis not present

## 2017-03-19 DIAGNOSIS — M75100 Unspecified rotator cuff tear or rupture of unspecified shoulder, not specified as traumatic: Secondary | ICD-10-CM | POA: Diagnosis not present

## 2017-03-19 DIAGNOSIS — M791 Myalgia: Secondary | ICD-10-CM | POA: Diagnosis not present

## 2017-03-19 DIAGNOSIS — G5600 Carpal tunnel syndrome, unspecified upper limb: Secondary | ICD-10-CM | POA: Diagnosis not present

## 2017-03-20 DIAGNOSIS — Z0000 Encounter for general adult medical examination without abnormal findings: Secondary | ICD-10-CM | POA: Diagnosis not present

## 2017-03-20 DIAGNOSIS — E785 Hyperlipidemia, unspecified: Secondary | ICD-10-CM | POA: Diagnosis not present

## 2017-03-20 DIAGNOSIS — E559 Vitamin D deficiency, unspecified: Secondary | ICD-10-CM | POA: Diagnosis not present

## 2017-03-20 DIAGNOSIS — I10 Essential (primary) hypertension: Secondary | ICD-10-CM | POA: Diagnosis not present

## 2017-03-20 DIAGNOSIS — I129 Hypertensive chronic kidney disease with stage 1 through stage 4 chronic kidney disease, or unspecified chronic kidney disease: Secondary | ICD-10-CM | POA: Diagnosis not present

## 2017-03-21 DIAGNOSIS — M9905 Segmental and somatic dysfunction of pelvic region: Secondary | ICD-10-CM | POA: Diagnosis not present

## 2017-03-21 DIAGNOSIS — Z72820 Sleep deprivation: Secondary | ICD-10-CM | POA: Diagnosis not present

## 2017-03-21 DIAGNOSIS — M9903 Segmental and somatic dysfunction of lumbar region: Secondary | ICD-10-CM | POA: Diagnosis not present

## 2017-03-21 DIAGNOSIS — M1990 Unspecified osteoarthritis, unspecified site: Secondary | ICD-10-CM | POA: Diagnosis not present

## 2017-03-21 DIAGNOSIS — R768 Other specified abnormal immunological findings in serum: Secondary | ICD-10-CM | POA: Diagnosis not present

## 2017-03-21 DIAGNOSIS — M791 Myalgia: Secondary | ICD-10-CM | POA: Diagnosis not present

## 2017-03-21 DIAGNOSIS — M75100 Unspecified rotator cuff tear or rupture of unspecified shoulder, not specified as traumatic: Secondary | ICD-10-CM | POA: Diagnosis not present

## 2017-03-21 DIAGNOSIS — M6240 Contracture of muscle, unspecified site: Secondary | ICD-10-CM | POA: Diagnosis not present

## 2017-03-21 DIAGNOSIS — M9902 Segmental and somatic dysfunction of thoracic region: Secondary | ICD-10-CM | POA: Diagnosis not present

## 2017-03-21 DIAGNOSIS — M9901 Segmental and somatic dysfunction of cervical region: Secondary | ICD-10-CM | POA: Diagnosis not present

## 2017-03-21 DIAGNOSIS — M545 Low back pain: Secondary | ICD-10-CM | POA: Diagnosis not present

## 2017-03-21 DIAGNOSIS — G5600 Carpal tunnel syndrome, unspecified upper limb: Secondary | ICD-10-CM | POA: Diagnosis not present

## 2017-03-21 DIAGNOSIS — M5430 Sciatica, unspecified side: Secondary | ICD-10-CM | POA: Diagnosis not present

## 2017-03-21 DIAGNOSIS — M79645 Pain in left finger(s): Secondary | ICD-10-CM | POA: Diagnosis not present

## 2017-03-21 DIAGNOSIS — G540 Brachial plexus disorders: Secondary | ICD-10-CM | POA: Diagnosis not present

## 2017-03-21 DIAGNOSIS — M7552 Bursitis of left shoulder: Secondary | ICD-10-CM | POA: Diagnosis not present

## 2017-03-26 ENCOUNTER — Other Ambulatory Visit: Payer: Self-pay | Admitting: Rheumatology

## 2017-03-26 DIAGNOSIS — M75100 Unspecified rotator cuff tear or rupture of unspecified shoulder, not specified as traumatic: Secondary | ICD-10-CM | POA: Diagnosis not present

## 2017-03-26 DIAGNOSIS — M9901 Segmental and somatic dysfunction of cervical region: Secondary | ICD-10-CM | POA: Diagnosis not present

## 2017-03-26 DIAGNOSIS — G5600 Carpal tunnel syndrome, unspecified upper limb: Secondary | ICD-10-CM | POA: Diagnosis not present

## 2017-03-26 DIAGNOSIS — G540 Brachial plexus disorders: Secondary | ICD-10-CM | POA: Diagnosis not present

## 2017-03-26 DIAGNOSIS — M791 Myalgia: Secondary | ICD-10-CM | POA: Diagnosis not present

## 2017-03-26 DIAGNOSIS — M9905 Segmental and somatic dysfunction of pelvic region: Secondary | ICD-10-CM | POA: Diagnosis not present

## 2017-03-26 DIAGNOSIS — M5430 Sciatica, unspecified side: Secondary | ICD-10-CM | POA: Diagnosis not present

## 2017-03-26 DIAGNOSIS — M9903 Segmental and somatic dysfunction of lumbar region: Secondary | ICD-10-CM | POA: Diagnosis not present

## 2017-03-26 DIAGNOSIS — M5412 Radiculopathy, cervical region: Secondary | ICD-10-CM

## 2017-03-26 DIAGNOSIS — Z72820 Sleep deprivation: Secondary | ICD-10-CM | POA: Diagnosis not present

## 2017-03-26 DIAGNOSIS — M9902 Segmental and somatic dysfunction of thoracic region: Secondary | ICD-10-CM | POA: Diagnosis not present

## 2017-03-26 DIAGNOSIS — M6240 Contracture of muscle, unspecified site: Secondary | ICD-10-CM | POA: Diagnosis not present

## 2017-03-28 DIAGNOSIS — G5600 Carpal tunnel syndrome, unspecified upper limb: Secondary | ICD-10-CM | POA: Diagnosis not present

## 2017-03-28 DIAGNOSIS — M791 Myalgia: Secondary | ICD-10-CM | POA: Diagnosis not present

## 2017-03-28 DIAGNOSIS — G540 Brachial plexus disorders: Secondary | ICD-10-CM | POA: Diagnosis not present

## 2017-03-28 DIAGNOSIS — M9903 Segmental and somatic dysfunction of lumbar region: Secondary | ICD-10-CM | POA: Diagnosis not present

## 2017-03-28 DIAGNOSIS — M6240 Contracture of muscle, unspecified site: Secondary | ICD-10-CM | POA: Diagnosis not present

## 2017-03-28 DIAGNOSIS — M9902 Segmental and somatic dysfunction of thoracic region: Secondary | ICD-10-CM | POA: Diagnosis not present

## 2017-03-28 DIAGNOSIS — M5430 Sciatica, unspecified side: Secondary | ICD-10-CM | POA: Diagnosis not present

## 2017-03-28 DIAGNOSIS — Z72820 Sleep deprivation: Secondary | ICD-10-CM | POA: Diagnosis not present

## 2017-03-28 DIAGNOSIS — M9901 Segmental and somatic dysfunction of cervical region: Secondary | ICD-10-CM | POA: Diagnosis not present

## 2017-03-28 DIAGNOSIS — M75100 Unspecified rotator cuff tear or rupture of unspecified shoulder, not specified as traumatic: Secondary | ICD-10-CM | POA: Diagnosis not present

## 2017-03-28 DIAGNOSIS — M9905 Segmental and somatic dysfunction of pelvic region: Secondary | ICD-10-CM | POA: Diagnosis not present

## 2017-03-31 DIAGNOSIS — M791 Myalgia: Secondary | ICD-10-CM | POA: Diagnosis not present

## 2017-03-31 DIAGNOSIS — M9901 Segmental and somatic dysfunction of cervical region: Secondary | ICD-10-CM | POA: Diagnosis not present

## 2017-03-31 DIAGNOSIS — M75100 Unspecified rotator cuff tear or rupture of unspecified shoulder, not specified as traumatic: Secondary | ICD-10-CM | POA: Diagnosis not present

## 2017-03-31 DIAGNOSIS — M6240 Contracture of muscle, unspecified site: Secondary | ICD-10-CM | POA: Diagnosis not present

## 2017-03-31 DIAGNOSIS — M5430 Sciatica, unspecified side: Secondary | ICD-10-CM | POA: Diagnosis not present

## 2017-03-31 DIAGNOSIS — M9902 Segmental and somatic dysfunction of thoracic region: Secondary | ICD-10-CM | POA: Diagnosis not present

## 2017-03-31 DIAGNOSIS — Z72820 Sleep deprivation: Secondary | ICD-10-CM | POA: Diagnosis not present

## 2017-03-31 DIAGNOSIS — G540 Brachial plexus disorders: Secondary | ICD-10-CM | POA: Diagnosis not present

## 2017-03-31 DIAGNOSIS — M9905 Segmental and somatic dysfunction of pelvic region: Secondary | ICD-10-CM | POA: Diagnosis not present

## 2017-03-31 DIAGNOSIS — M9903 Segmental and somatic dysfunction of lumbar region: Secondary | ICD-10-CM | POA: Diagnosis not present

## 2017-03-31 DIAGNOSIS — G5600 Carpal tunnel syndrome, unspecified upper limb: Secondary | ICD-10-CM | POA: Diagnosis not present

## 2017-04-02 ENCOUNTER — Ambulatory Visit
Admission: RE | Admit: 2017-04-02 | Discharge: 2017-04-02 | Disposition: A | Discharge status: Home / Self Care | Payer: Medicare Other | Source: Ambulatory Visit | Attending: Rheumatology | Admitting: Rheumatology

## 2017-04-02 DIAGNOSIS — M5412 Radiculopathy, cervical region: Secondary | ICD-10-CM

## 2017-04-02 DIAGNOSIS — M50223 Other cervical disc displacement at C6-C7 level: Secondary | ICD-10-CM | POA: Diagnosis not present

## 2017-04-02 MED ORDER — GADOBENATE DIMEGLUMINE 529 MG/ML IV SOLN
15.0000 mL | Freq: Once | INTRAVENOUS | Status: AC | PRN
  Administered 2017-04-02: 13 mL via INTRAVENOUS

## 2017-04-04 DIAGNOSIS — M5412 Radiculopathy, cervical region: Secondary | ICD-10-CM | POA: Diagnosis not present

## 2017-04-04 DIAGNOSIS — R768 Other specified abnormal immunological findings in serum: Secondary | ICD-10-CM | POA: Diagnosis not present

## 2017-04-04 DIAGNOSIS — M542 Cervicalgia: Secondary | ICD-10-CM | POA: Diagnosis not present

## 2017-04-04 DIAGNOSIS — M1990 Unspecified osteoarthritis, unspecified site: Secondary | ICD-10-CM | POA: Diagnosis not present

## 2017-04-04 DIAGNOSIS — M7552 Bursitis of left shoulder: Secondary | ICD-10-CM | POA: Diagnosis not present

## 2017-04-04 DIAGNOSIS — M79645 Pain in left finger(s): Secondary | ICD-10-CM | POA: Diagnosis not present

## 2017-04-04 DIAGNOSIS — M545 Low back pain: Secondary | ICD-10-CM | POA: Diagnosis not present

## 2017-04-07 DIAGNOSIS — M9903 Segmental and somatic dysfunction of lumbar region: Secondary | ICD-10-CM | POA: Diagnosis not present

## 2017-04-07 DIAGNOSIS — M6240 Contracture of muscle, unspecified site: Secondary | ICD-10-CM | POA: Diagnosis not present

## 2017-04-07 DIAGNOSIS — M791 Myalgia: Secondary | ICD-10-CM | POA: Diagnosis not present

## 2017-04-07 DIAGNOSIS — M5430 Sciatica, unspecified side: Secondary | ICD-10-CM | POA: Diagnosis not present

## 2017-04-07 DIAGNOSIS — M9901 Segmental and somatic dysfunction of cervical region: Secondary | ICD-10-CM | POA: Diagnosis not present

## 2017-04-07 DIAGNOSIS — M9902 Segmental and somatic dysfunction of thoracic region: Secondary | ICD-10-CM | POA: Diagnosis not present

## 2017-04-07 DIAGNOSIS — M9905 Segmental and somatic dysfunction of pelvic region: Secondary | ICD-10-CM | POA: Diagnosis not present

## 2017-04-07 DIAGNOSIS — M75100 Unspecified rotator cuff tear or rupture of unspecified shoulder, not specified as traumatic: Secondary | ICD-10-CM | POA: Diagnosis not present

## 2017-04-07 DIAGNOSIS — G540 Brachial plexus disorders: Secondary | ICD-10-CM | POA: Diagnosis not present

## 2017-04-07 DIAGNOSIS — G5600 Carpal tunnel syndrome, unspecified upper limb: Secondary | ICD-10-CM | POA: Diagnosis not present

## 2017-04-07 DIAGNOSIS — Z72820 Sleep deprivation: Secondary | ICD-10-CM | POA: Diagnosis not present

## 2017-04-09 DIAGNOSIS — G5600 Carpal tunnel syndrome, unspecified upper limb: Secondary | ICD-10-CM | POA: Diagnosis not present

## 2017-04-09 DIAGNOSIS — M6240 Contracture of muscle, unspecified site: Secondary | ICD-10-CM | POA: Diagnosis not present

## 2017-04-09 DIAGNOSIS — M9905 Segmental and somatic dysfunction of pelvic region: Secondary | ICD-10-CM | POA: Diagnosis not present

## 2017-04-09 DIAGNOSIS — M75100 Unspecified rotator cuff tear or rupture of unspecified shoulder, not specified as traumatic: Secondary | ICD-10-CM | POA: Diagnosis not present

## 2017-04-09 DIAGNOSIS — M9903 Segmental and somatic dysfunction of lumbar region: Secondary | ICD-10-CM | POA: Diagnosis not present

## 2017-04-09 DIAGNOSIS — M9901 Segmental and somatic dysfunction of cervical region: Secondary | ICD-10-CM | POA: Diagnosis not present

## 2017-04-09 DIAGNOSIS — M9902 Segmental and somatic dysfunction of thoracic region: Secondary | ICD-10-CM | POA: Diagnosis not present

## 2017-04-09 DIAGNOSIS — M5430 Sciatica, unspecified side: Secondary | ICD-10-CM | POA: Diagnosis not present

## 2017-04-09 DIAGNOSIS — Z72820 Sleep deprivation: Secondary | ICD-10-CM | POA: Diagnosis not present

## 2017-04-09 DIAGNOSIS — G540 Brachial plexus disorders: Secondary | ICD-10-CM | POA: Diagnosis not present

## 2017-04-09 DIAGNOSIS — M791 Myalgia: Secondary | ICD-10-CM | POA: Diagnosis not present

## 2017-04-11 DIAGNOSIS — M9905 Segmental and somatic dysfunction of pelvic region: Secondary | ICD-10-CM | POA: Diagnosis not present

## 2017-04-11 DIAGNOSIS — M9903 Segmental and somatic dysfunction of lumbar region: Secondary | ICD-10-CM | POA: Diagnosis not present

## 2017-04-11 DIAGNOSIS — M75100 Unspecified rotator cuff tear or rupture of unspecified shoulder, not specified as traumatic: Secondary | ICD-10-CM | POA: Diagnosis not present

## 2017-04-11 DIAGNOSIS — Z72820 Sleep deprivation: Secondary | ICD-10-CM | POA: Diagnosis not present

## 2017-04-11 DIAGNOSIS — M9901 Segmental and somatic dysfunction of cervical region: Secondary | ICD-10-CM | POA: Diagnosis not present

## 2017-04-11 DIAGNOSIS — M6240 Contracture of muscle, unspecified site: Secondary | ICD-10-CM | POA: Diagnosis not present

## 2017-04-11 DIAGNOSIS — M791 Myalgia: Secondary | ICD-10-CM | POA: Diagnosis not present

## 2017-04-11 DIAGNOSIS — M9902 Segmental and somatic dysfunction of thoracic region: Secondary | ICD-10-CM | POA: Diagnosis not present

## 2017-04-11 DIAGNOSIS — M5430 Sciatica, unspecified side: Secondary | ICD-10-CM | POA: Diagnosis not present

## 2017-04-11 DIAGNOSIS — G5600 Carpal tunnel syndrome, unspecified upper limb: Secondary | ICD-10-CM | POA: Diagnosis not present

## 2017-04-11 DIAGNOSIS — G540 Brachial plexus disorders: Secondary | ICD-10-CM | POA: Diagnosis not present

## 2017-04-14 DIAGNOSIS — M75100 Unspecified rotator cuff tear or rupture of unspecified shoulder, not specified as traumatic: Secondary | ICD-10-CM | POA: Diagnosis not present

## 2017-04-14 DIAGNOSIS — M9901 Segmental and somatic dysfunction of cervical region: Secondary | ICD-10-CM | POA: Diagnosis not present

## 2017-04-14 DIAGNOSIS — M9903 Segmental and somatic dysfunction of lumbar region: Secondary | ICD-10-CM | POA: Diagnosis not present

## 2017-04-14 DIAGNOSIS — G5600 Carpal tunnel syndrome, unspecified upper limb: Secondary | ICD-10-CM | POA: Diagnosis not present

## 2017-04-14 DIAGNOSIS — M9905 Segmental and somatic dysfunction of pelvic region: Secondary | ICD-10-CM | POA: Diagnosis not present

## 2017-04-14 DIAGNOSIS — G540 Brachial plexus disorders: Secondary | ICD-10-CM | POA: Diagnosis not present

## 2017-04-14 DIAGNOSIS — M9902 Segmental and somatic dysfunction of thoracic region: Secondary | ICD-10-CM | POA: Diagnosis not present

## 2017-04-14 DIAGNOSIS — M5430 Sciatica, unspecified side: Secondary | ICD-10-CM | POA: Diagnosis not present

## 2017-04-14 DIAGNOSIS — M6240 Contracture of muscle, unspecified site: Secondary | ICD-10-CM | POA: Diagnosis not present

## 2017-04-14 DIAGNOSIS — Z72820 Sleep deprivation: Secondary | ICD-10-CM | POA: Diagnosis not present

## 2017-04-14 DIAGNOSIS — M791 Myalgia: Secondary | ICD-10-CM | POA: Diagnosis not present

## 2017-04-17 DIAGNOSIS — E559 Vitamin D deficiency, unspecified: Secondary | ICD-10-CM | POA: Diagnosis not present

## 2017-04-17 DIAGNOSIS — E7800 Pure hypercholesterolemia, unspecified: Secondary | ICD-10-CM | POA: Diagnosis not present

## 2017-04-17 DIAGNOSIS — I10 Essential (primary) hypertension: Secondary | ICD-10-CM | POA: Diagnosis not present

## 2017-04-17 DIAGNOSIS — M79601 Pain in right arm: Secondary | ICD-10-CM | POA: Diagnosis not present

## 2017-04-18 ENCOUNTER — Other Ambulatory Visit: Payer: Self-pay | Admitting: Internal Medicine

## 2017-04-18 ENCOUNTER — Other Ambulatory Visit: Payer: Self-pay | Admitting: Obstetrics and Gynecology

## 2017-04-18 DIAGNOSIS — R768 Other specified abnormal immunological findings in serum: Secondary | ICD-10-CM | POA: Diagnosis not present

## 2017-04-18 DIAGNOSIS — G5600 Carpal tunnel syndrome, unspecified upper limb: Secondary | ICD-10-CM | POA: Diagnosis not present

## 2017-04-18 DIAGNOSIS — M545 Low back pain: Secondary | ICD-10-CM | POA: Diagnosis not present

## 2017-04-18 DIAGNOSIS — M1990 Unspecified osteoarthritis, unspecified site: Secondary | ICD-10-CM | POA: Diagnosis not present

## 2017-04-18 DIAGNOSIS — Z1231 Encounter for screening mammogram for malignant neoplasm of breast: Secondary | ICD-10-CM

## 2017-04-18 DIAGNOSIS — E041 Nontoxic single thyroid nodule: Secondary | ICD-10-CM

## 2017-04-21 DIAGNOSIS — M75100 Unspecified rotator cuff tear or rupture of unspecified shoulder, not specified as traumatic: Secondary | ICD-10-CM | POA: Diagnosis not present

## 2017-04-21 DIAGNOSIS — M5430 Sciatica, unspecified side: Secondary | ICD-10-CM | POA: Diagnosis not present

## 2017-04-21 DIAGNOSIS — M9902 Segmental and somatic dysfunction of thoracic region: Secondary | ICD-10-CM | POA: Diagnosis not present

## 2017-04-21 DIAGNOSIS — G540 Brachial plexus disorders: Secondary | ICD-10-CM | POA: Diagnosis not present

## 2017-04-21 DIAGNOSIS — Z72820 Sleep deprivation: Secondary | ICD-10-CM | POA: Diagnosis not present

## 2017-04-21 DIAGNOSIS — M9901 Segmental and somatic dysfunction of cervical region: Secondary | ICD-10-CM | POA: Diagnosis not present

## 2017-04-21 DIAGNOSIS — M9905 Segmental and somatic dysfunction of pelvic region: Secondary | ICD-10-CM | POA: Diagnosis not present

## 2017-04-21 DIAGNOSIS — G5600 Carpal tunnel syndrome, unspecified upper limb: Secondary | ICD-10-CM | POA: Diagnosis not present

## 2017-04-21 DIAGNOSIS — M791 Myalgia: Secondary | ICD-10-CM | POA: Diagnosis not present

## 2017-04-21 DIAGNOSIS — M9903 Segmental and somatic dysfunction of lumbar region: Secondary | ICD-10-CM | POA: Diagnosis not present

## 2017-04-21 DIAGNOSIS — M6240 Contracture of muscle, unspecified site: Secondary | ICD-10-CM | POA: Diagnosis not present

## 2017-04-22 ENCOUNTER — Ambulatory Visit: Payer: PRIVATE HEALTH INSURANCE

## 2017-04-22 ENCOUNTER — Ambulatory Visit
Admission: RE | Admit: 2017-04-22 | Discharge: 2017-04-22 | Disposition: A | Discharge status: Home / Self Care | Payer: Medicare Other | Source: Ambulatory Visit | Attending: Obstetrics and Gynecology | Admitting: Obstetrics and Gynecology

## 2017-04-22 DIAGNOSIS — Z1231 Encounter for screening mammogram for malignant neoplasm of breast: Secondary | ICD-10-CM | POA: Diagnosis not present

## 2017-04-24 ENCOUNTER — Encounter: Payer: Self-pay | Admitting: Neurology

## 2017-04-24 ENCOUNTER — Ambulatory Visit (INDEPENDENT_AMBULATORY_CARE_PROVIDER_SITE_OTHER): Payer: Medicare Other | Admitting: Neurology

## 2017-04-24 VITALS — BP 110/80 | Ht 61.0 in | Wt 137.5 lb

## 2017-04-24 DIAGNOSIS — R202 Paresthesia of skin: Secondary | ICD-10-CM | POA: Diagnosis not present

## 2017-04-24 DIAGNOSIS — G8929 Other chronic pain: Secondary | ICD-10-CM

## 2017-04-24 DIAGNOSIS — M542 Cervicalgia: Secondary | ICD-10-CM

## 2017-04-24 DIAGNOSIS — M25512 Pain in left shoulder: Secondary | ICD-10-CM | POA: Diagnosis not present

## 2017-04-24 DIAGNOSIS — M25511 Pain in right shoulder: Secondary | ICD-10-CM

## 2017-04-24 MED ORDER — DULOXETINE HCL 30 MG PO CPEP: 30.0000 mg | ORAL_CAPSULE | Freq: Every day | ORAL | 6 refills | Status: DC

## 2017-04-24 MED ORDER — VALACYCLOVIR HCL 500 MG PO TABS: 1000.0000 mg | ORAL_TABLET | Freq: Two times a day (BID) | ORAL | 0 refills | Status: DC

## 2017-04-24 NOTE — Progress Notes (Signed)
  PATIENT: Tracy Horne DOB: 07/15/1946  Chief Complaint  Patient presents with  . Rm New  . New Evaluation  . Numbness    C/o R upper extremity pain that starts in inner elbow, numbness and tingling in 3 fingers and thumb.     HISTORICAL  Tracy Horne is a 71 years old right-handed female, seen in refer by her primary care and rheumatologist from [facility] Dr.Syed, Tauseef G, and Dr. Kim, James evaluation of right arm pain, initial evaluation was on April 24 2017.  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  MRI of cervical spine on Apr 02 2017, mild degenerative changes, without canal or foraminal stenosis.  Since April 2018, she began to experience recurrent radiating pain from right shoulder blade travel along right inner arm, forearm, involving right hand, initially at the right first 3 fingers, now seems to involving all 5 fingers, also subjective weakness, difficulty opening a jar   At the beginning of the April, she also noticed right arm deep achy pain, there was no rash broke out, pain has much improved, she still has intermittent paresthesia,   She denies gait abnormality no left arm involvement.   REVIEW OF SYSTEMS: Full 14 system review of systems performed and notable only for as above  ALLERGIES: Allergies  Allergen Reactions  . Felodipine     HOME MEDICATIONS: Current Outpatient Prescriptions  Medication Sig Dispense Refill  . acetaminophen (TYLENOL) 325 MG tablet Take 650 mg by mouth every 6  (six) hours as needed.    . amLODipine (NORVASC) 2.5 MG tablet amlodipine 2.5 mg tablet    . Biotin 1 MG CAPS biotin    . telmisartan-hydrochlorothiazide (MICARDIS HCT) 80-12.5 MG tablet telmisartan 80 mg-hydrochlorothiazide 12.5 mg tablet     No current facility-administered medications for this visit.     PAST MEDICAL HISTORY: Past Medical History:  Diagnosis Date  . Hypertension     PAST SURGICAL HISTORY: Past Surgical History:  Procedure Laterality Date  . BREAST EXCISIONAL BIOPSY Left   . PILONIDAL CYST EXCISION      FAMILY HISTORY: Family History  Problem Relation Age of Onset  . Heart attack Mother   . Heart failure Father   . High blood pressure Brother   . Breast cancer Neg Hx     SOCIAL HISTORY:  Social History   Social History  . Marital status: Married    Spouse name: N/A  . Number of children: 1  . Years of education: N/A   Occupational History  . Retired    Social History Main Topics  . Smoking status: Passive Smoke Exposure - Never Smoker  . Smokeless tobacco: Never Used  . Alcohol use No  . Drug use: No  . Sexual activity: Not on file   Other Topics Concern  . Not on file   Social History Narrative   Lives at home w/ her family   Right-handed   Caffeine: 2 cups of coffee per day, occasional sweet tea     PHYSICAL EXAM   Vitals:   04/24/17 1330  BP:   110/80  Weight: 137 lb 8 oz (62.4 kg)  Height: 5' 1" (1.549 m)    Not recorded      Body mass index is 25.98 kg/m.  PHYSICAL EXAMNIATION:  Gen: NAD, conversant, well nourised, obese, well groomed                     Cardiovascular: Regular rate rhythm, no peripheral edema, warm, nontender. Eyes: Conjunctivae clear without exudates or hemorrhage Neck: Supple, no carotid bruits. Pulmonary: Clear to auscultation bilaterally   NEUROLOGICAL EXAM:  MENTAL STATUS: Speech:    Speech is normal; fluent and spontaneous with normal comprehension.  Cognition:     Orientation to  time, place and person     Normal recent and remote memory     Normal Attention span and concentration     Normal Language, naming, repeating,spontaneous speech     Fund of knowledge   CRANIAL NERVES: CN II: Visual fields are full to confrontation. Fundoscopic exam is normal with sharp discs and no vascular changes. Pupils are round equal and briskly reactive to light. CN III, IV, VI: extraocular movement are normal. No ptosis. CN V: Facial sensation is intact to pinprick in all 3 divisions bilaterally. Corneal responses are intact.  CN VII: Face is symmetric with normal eye closure and smile. CN VIII: Hearing is normal to rubbing fingers CN IX, X: Palate elevates symmetrically. Phonation is normal. CN XI: Head turning and shoulder shrug are intact CN XII: Tongue is midline with normal movements and no atrophy.  MOTOR: There is no pronator drift of out-stretched arms. Muscle bulk and tone are normal. Muscle strength is normal.  REFLEXES: Reflexes are 2+ and symmetric at the biceps, triceps, knees, and ankles. Plantar responses are flexor.  SENSORY: Intact to light touch, pinprick, positional sensation and vibratory sensation are intact in fingers and toes.  COORDINATION: Rapid alternating movements and fine finger movements are intact. There is no dysmetria on finger-to-nose and heel-knee-shin.    GAIT/STANCE: Posture is normal. Gait is steady with normal steps, base, arm swing, and turning. Heel and toe walking are normal. Tandem gait is normal.  Romberg is absent.   DIAGNOSTIC DATA (LABS, IMAGING, TESTING) - I reviewed patient records, labs, notes, testing and imaging myself where available.   ASSESSMENT AND PLAN  Tracy Horne is a 71 y.o. female   Right shoulder pain, radiating pain at the right C8,T 1 distribution, subjective mild right finger weakness,  Most consistent with right brachial plexopathy, involving lower trunk  MRI of right shoulder with without contrast  to rule out structural lesion  Cymbalta 30 mg daily  Valtrex 500 mg twice a day     Yijun Yan, M.D. Ph.D.  [facility] [address], [address] [place], [place] [address] Ph: [phone] Fax: [phone]  CC: Syed, Tauseef G, MD, Kim, James, MD 

## 2017-04-25 ENCOUNTER — Ambulatory Visit
Admission: RE | Admit: 2017-04-25 | Discharge: 2017-04-25 | Disposition: A | Discharge status: Home / Self Care | Payer: Medicare Other | Source: Ambulatory Visit | Attending: Internal Medicine | Admitting: Internal Medicine

## 2017-04-25 DIAGNOSIS — E041 Nontoxic single thyroid nodule: Secondary | ICD-10-CM

## 2017-04-25 DIAGNOSIS — G5600 Carpal tunnel syndrome, unspecified upper limb: Secondary | ICD-10-CM | POA: Diagnosis not present

## 2017-04-25 DIAGNOSIS — M75100 Unspecified rotator cuff tear or rupture of unspecified shoulder, not specified as traumatic: Secondary | ICD-10-CM | POA: Diagnosis not present

## 2017-04-25 DIAGNOSIS — M9902 Segmental and somatic dysfunction of thoracic region: Secondary | ICD-10-CM | POA: Diagnosis not present

## 2017-04-25 DIAGNOSIS — M9901 Segmental and somatic dysfunction of cervical region: Secondary | ICD-10-CM | POA: Diagnosis not present

## 2017-04-25 DIAGNOSIS — M9905 Segmental and somatic dysfunction of pelvic region: Secondary | ICD-10-CM | POA: Diagnosis not present

## 2017-04-25 DIAGNOSIS — M9903 Segmental and somatic dysfunction of lumbar region: Secondary | ICD-10-CM | POA: Diagnosis not present

## 2017-04-25 DIAGNOSIS — M6240 Contracture of muscle, unspecified site: Secondary | ICD-10-CM | POA: Diagnosis not present

## 2017-04-25 DIAGNOSIS — G540 Brachial plexus disorders: Secondary | ICD-10-CM | POA: Diagnosis not present

## 2017-04-25 DIAGNOSIS — M5430 Sciatica, unspecified side: Secondary | ICD-10-CM | POA: Diagnosis not present

## 2017-04-25 DIAGNOSIS — Z72820 Sleep deprivation: Secondary | ICD-10-CM | POA: Diagnosis not present

## 2017-04-25 DIAGNOSIS — M791 Myalgia: Secondary | ICD-10-CM | POA: Diagnosis not present

## 2017-04-28 DIAGNOSIS — M75100 Unspecified rotator cuff tear or rupture of unspecified shoulder, not specified as traumatic: Secondary | ICD-10-CM | POA: Diagnosis not present

## 2017-04-28 DIAGNOSIS — G540 Brachial plexus disorders: Secondary | ICD-10-CM | POA: Diagnosis not present

## 2017-04-28 DIAGNOSIS — M9901 Segmental and somatic dysfunction of cervical region: Secondary | ICD-10-CM | POA: Diagnosis not present

## 2017-04-28 DIAGNOSIS — M9903 Segmental and somatic dysfunction of lumbar region: Secondary | ICD-10-CM | POA: Diagnosis not present

## 2017-04-28 DIAGNOSIS — G5600 Carpal tunnel syndrome, unspecified upper limb: Secondary | ICD-10-CM | POA: Diagnosis not present

## 2017-04-28 DIAGNOSIS — M6240 Contracture of muscle, unspecified site: Secondary | ICD-10-CM | POA: Diagnosis not present

## 2017-04-28 DIAGNOSIS — M791 Myalgia: Secondary | ICD-10-CM | POA: Diagnosis not present

## 2017-04-28 DIAGNOSIS — M9905 Segmental and somatic dysfunction of pelvic region: Secondary | ICD-10-CM | POA: Diagnosis not present

## 2017-04-28 DIAGNOSIS — M9902 Segmental and somatic dysfunction of thoracic region: Secondary | ICD-10-CM | POA: Diagnosis not present

## 2017-04-28 DIAGNOSIS — M5430 Sciatica, unspecified side: Secondary | ICD-10-CM | POA: Diagnosis not present

## 2017-04-28 DIAGNOSIS — Z72820 Sleep deprivation: Secondary | ICD-10-CM | POA: Diagnosis not present

## 2017-04-30 DIAGNOSIS — M9902 Segmental and somatic dysfunction of thoracic region: Secondary | ICD-10-CM | POA: Diagnosis not present

## 2017-04-30 DIAGNOSIS — M791 Myalgia: Secondary | ICD-10-CM | POA: Diagnosis not present

## 2017-04-30 DIAGNOSIS — Z72820 Sleep deprivation: Secondary | ICD-10-CM | POA: Diagnosis not present

## 2017-04-30 DIAGNOSIS — M9905 Segmental and somatic dysfunction of pelvic region: Secondary | ICD-10-CM | POA: Diagnosis not present

## 2017-04-30 DIAGNOSIS — M75100 Unspecified rotator cuff tear or rupture of unspecified shoulder, not specified as traumatic: Secondary | ICD-10-CM | POA: Diagnosis not present

## 2017-04-30 DIAGNOSIS — M5430 Sciatica, unspecified side: Secondary | ICD-10-CM | POA: Diagnosis not present

## 2017-04-30 DIAGNOSIS — G540 Brachial plexus disorders: Secondary | ICD-10-CM | POA: Diagnosis not present

## 2017-04-30 DIAGNOSIS — G5600 Carpal tunnel syndrome, unspecified upper limb: Secondary | ICD-10-CM | POA: Diagnosis not present

## 2017-04-30 DIAGNOSIS — M6240 Contracture of muscle, unspecified site: Secondary | ICD-10-CM | POA: Diagnosis not present

## 2017-04-30 DIAGNOSIS — M9903 Segmental and somatic dysfunction of lumbar region: Secondary | ICD-10-CM | POA: Diagnosis not present

## 2017-04-30 DIAGNOSIS — M9901 Segmental and somatic dysfunction of cervical region: Secondary | ICD-10-CM | POA: Diagnosis not present

## 2017-05-02 DIAGNOSIS — M6240 Contracture of muscle, unspecified site: Secondary | ICD-10-CM | POA: Diagnosis not present

## 2017-05-02 DIAGNOSIS — G540 Brachial plexus disorders: Secondary | ICD-10-CM | POA: Diagnosis not present

## 2017-05-02 DIAGNOSIS — M791 Myalgia: Secondary | ICD-10-CM | POA: Diagnosis not present

## 2017-05-02 DIAGNOSIS — Z72820 Sleep deprivation: Secondary | ICD-10-CM | POA: Diagnosis not present

## 2017-05-02 DIAGNOSIS — M9902 Segmental and somatic dysfunction of thoracic region: Secondary | ICD-10-CM | POA: Diagnosis not present

## 2017-05-02 DIAGNOSIS — G5600 Carpal tunnel syndrome, unspecified upper limb: Secondary | ICD-10-CM | POA: Diagnosis not present

## 2017-05-02 DIAGNOSIS — M5430 Sciatica, unspecified side: Secondary | ICD-10-CM | POA: Diagnosis not present

## 2017-05-02 DIAGNOSIS — M9903 Segmental and somatic dysfunction of lumbar region: Secondary | ICD-10-CM | POA: Diagnosis not present

## 2017-05-02 DIAGNOSIS — M9905 Segmental and somatic dysfunction of pelvic region: Secondary | ICD-10-CM | POA: Diagnosis not present

## 2017-05-02 DIAGNOSIS — M75100 Unspecified rotator cuff tear or rupture of unspecified shoulder, not specified as traumatic: Secondary | ICD-10-CM | POA: Diagnosis not present

## 2017-05-02 DIAGNOSIS — M9901 Segmental and somatic dysfunction of cervical region: Secondary | ICD-10-CM | POA: Diagnosis not present

## 2017-05-15 ENCOUNTER — Ambulatory Visit
Admission: RE | Admit: 2017-05-15 | Discharge: 2017-05-15 | Disposition: A | Discharge status: Home / Self Care | Payer: Medicare Other | Source: Ambulatory Visit | Attending: Neurology | Admitting: Neurology

## 2017-05-15 DIAGNOSIS — R202 Paresthesia of skin: Secondary | ICD-10-CM

## 2017-05-15 DIAGNOSIS — M25512 Pain in left shoulder: Secondary | ICD-10-CM

## 2017-05-15 DIAGNOSIS — M25511 Pain in right shoulder: Secondary | ICD-10-CM

## 2017-05-15 DIAGNOSIS — M542 Cervicalgia: Secondary | ICD-10-CM

## 2017-05-15 DIAGNOSIS — R200 Anesthesia of skin: Secondary | ICD-10-CM | POA: Diagnosis not present

## 2017-05-15 DIAGNOSIS — G8929 Other chronic pain: Secondary | ICD-10-CM

## 2017-05-15 MED ORDER — GADOBENATE DIMEGLUMINE 529 MG/ML IV SOLN
13.0000 mL | Freq: Once | INTRAVENOUS | Status: AC | PRN
  Administered 2017-05-15: 13 mL via INTRAVENOUS

## 2017-05-16 DIAGNOSIS — M9905 Segmental and somatic dysfunction of pelvic region: Secondary | ICD-10-CM | POA: Diagnosis not present

## 2017-05-16 DIAGNOSIS — M791 Myalgia: Secondary | ICD-10-CM | POA: Diagnosis not present

## 2017-05-16 DIAGNOSIS — M6240 Contracture of muscle, unspecified site: Secondary | ICD-10-CM | POA: Diagnosis not present

## 2017-05-16 DIAGNOSIS — M9901 Segmental and somatic dysfunction of cervical region: Secondary | ICD-10-CM | POA: Diagnosis not present

## 2017-05-16 DIAGNOSIS — G540 Brachial plexus disorders: Secondary | ICD-10-CM | POA: Diagnosis not present

## 2017-05-16 DIAGNOSIS — Z72820 Sleep deprivation: Secondary | ICD-10-CM | POA: Diagnosis not present

## 2017-05-16 DIAGNOSIS — M9902 Segmental and somatic dysfunction of thoracic region: Secondary | ICD-10-CM | POA: Diagnosis not present

## 2017-05-16 DIAGNOSIS — G5600 Carpal tunnel syndrome, unspecified upper limb: Secondary | ICD-10-CM | POA: Diagnosis not present

## 2017-05-16 DIAGNOSIS — M75100 Unspecified rotator cuff tear or rupture of unspecified shoulder, not specified as traumatic: Secondary | ICD-10-CM | POA: Diagnosis not present

## 2017-05-16 DIAGNOSIS — M9903 Segmental and somatic dysfunction of lumbar region: Secondary | ICD-10-CM | POA: Diagnosis not present

## 2017-05-16 DIAGNOSIS — M5430 Sciatica, unspecified side: Secondary | ICD-10-CM | POA: Diagnosis not present

## 2017-05-19 ENCOUNTER — Other Ambulatory Visit: Payer: Self-pay | Admitting: *Deleted

## 2017-05-19 DIAGNOSIS — M9905 Segmental and somatic dysfunction of pelvic region: Secondary | ICD-10-CM | POA: Diagnosis not present

## 2017-05-19 DIAGNOSIS — G540 Brachial plexus disorders: Secondary | ICD-10-CM | POA: Diagnosis not present

## 2017-05-19 DIAGNOSIS — G5600 Carpal tunnel syndrome, unspecified upper limb: Secondary | ICD-10-CM | POA: Diagnosis not present

## 2017-05-19 DIAGNOSIS — M5430 Sciatica, unspecified side: Secondary | ICD-10-CM | POA: Diagnosis not present

## 2017-05-19 DIAGNOSIS — M9901 Segmental and somatic dysfunction of cervical region: Secondary | ICD-10-CM | POA: Diagnosis not present

## 2017-05-19 DIAGNOSIS — M75101 Unspecified rotator cuff tear or rupture of right shoulder, not specified as traumatic: Secondary | ICD-10-CM

## 2017-05-19 DIAGNOSIS — M791 Myalgia: Secondary | ICD-10-CM | POA: Diagnosis not present

## 2017-05-19 DIAGNOSIS — M6240 Contracture of muscle, unspecified site: Secondary | ICD-10-CM | POA: Diagnosis not present

## 2017-05-19 DIAGNOSIS — M9902 Segmental and somatic dysfunction of thoracic region: Secondary | ICD-10-CM | POA: Diagnosis not present

## 2017-05-19 DIAGNOSIS — M9903 Segmental and somatic dysfunction of lumbar region: Secondary | ICD-10-CM | POA: Diagnosis not present

## 2017-05-19 DIAGNOSIS — M75100 Unspecified rotator cuff tear or rupture of unspecified shoulder, not specified as traumatic: Secondary | ICD-10-CM | POA: Diagnosis not present

## 2017-05-19 DIAGNOSIS — Z72820 Sleep deprivation: Secondary | ICD-10-CM | POA: Diagnosis not present

## 2017-05-23 DIAGNOSIS — M9902 Segmental and somatic dysfunction of thoracic region: Secondary | ICD-10-CM | POA: Diagnosis not present

## 2017-05-23 DIAGNOSIS — G540 Brachial plexus disorders: Secondary | ICD-10-CM | POA: Diagnosis not present

## 2017-05-23 DIAGNOSIS — M9903 Segmental and somatic dysfunction of lumbar region: Secondary | ICD-10-CM | POA: Diagnosis not present

## 2017-05-23 DIAGNOSIS — M9901 Segmental and somatic dysfunction of cervical region: Secondary | ICD-10-CM | POA: Diagnosis not present

## 2017-05-23 DIAGNOSIS — G5600 Carpal tunnel syndrome, unspecified upper limb: Secondary | ICD-10-CM | POA: Diagnosis not present

## 2017-05-23 DIAGNOSIS — M75100 Unspecified rotator cuff tear or rupture of unspecified shoulder, not specified as traumatic: Secondary | ICD-10-CM | POA: Diagnosis not present

## 2017-05-23 DIAGNOSIS — M791 Myalgia: Secondary | ICD-10-CM | POA: Diagnosis not present

## 2017-05-23 DIAGNOSIS — M9905 Segmental and somatic dysfunction of pelvic region: Secondary | ICD-10-CM | POA: Diagnosis not present

## 2017-05-23 DIAGNOSIS — Z72820 Sleep deprivation: Secondary | ICD-10-CM | POA: Diagnosis not present

## 2017-05-23 DIAGNOSIS — M5430 Sciatica, unspecified side: Secondary | ICD-10-CM | POA: Diagnosis not present

## 2017-05-23 DIAGNOSIS — M6240 Contracture of muscle, unspecified site: Secondary | ICD-10-CM | POA: Diagnosis not present

## 2017-05-26 ENCOUNTER — Other Ambulatory Visit: Payer: Self-pay | Admitting: Internal Medicine

## 2017-05-26 DIAGNOSIS — M9905 Segmental and somatic dysfunction of pelvic region: Secondary | ICD-10-CM | POA: Diagnosis not present

## 2017-05-26 DIAGNOSIS — Z72820 Sleep deprivation: Secondary | ICD-10-CM | POA: Diagnosis not present

## 2017-05-26 DIAGNOSIS — M9903 Segmental and somatic dysfunction of lumbar region: Secondary | ICD-10-CM | POA: Diagnosis not present

## 2017-05-26 DIAGNOSIS — M791 Myalgia: Secondary | ICD-10-CM | POA: Diagnosis not present

## 2017-05-26 DIAGNOSIS — M75100 Unspecified rotator cuff tear or rupture of unspecified shoulder, not specified as traumatic: Secondary | ICD-10-CM | POA: Diagnosis not present

## 2017-05-26 DIAGNOSIS — E041 Nontoxic single thyroid nodule: Secondary | ICD-10-CM

## 2017-05-26 DIAGNOSIS — G540 Brachial plexus disorders: Secondary | ICD-10-CM | POA: Diagnosis not present

## 2017-05-26 DIAGNOSIS — G5600 Carpal tunnel syndrome, unspecified upper limb: Secondary | ICD-10-CM | POA: Diagnosis not present

## 2017-05-26 DIAGNOSIS — M75121 Complete rotator cuff tear or rupture of right shoulder, not specified as traumatic: Secondary | ICD-10-CM | POA: Diagnosis not present

## 2017-05-26 DIAGNOSIS — M5430 Sciatica, unspecified side: Secondary | ICD-10-CM | POA: Diagnosis not present

## 2017-05-26 DIAGNOSIS — M6240 Contracture of muscle, unspecified site: Secondary | ICD-10-CM | POA: Diagnosis not present

## 2017-05-26 DIAGNOSIS — M9901 Segmental and somatic dysfunction of cervical region: Secondary | ICD-10-CM | POA: Diagnosis not present

## 2017-05-26 DIAGNOSIS — M9902 Segmental and somatic dysfunction of thoracic region: Secondary | ICD-10-CM | POA: Diagnosis not present

## 2017-05-28 DIAGNOSIS — M75121 Complete rotator cuff tear or rupture of right shoulder, not specified as traumatic: Secondary | ICD-10-CM | POA: Diagnosis not present

## 2017-05-28 DIAGNOSIS — M25511 Pain in right shoulder: Secondary | ICD-10-CM | POA: Diagnosis not present

## 2017-05-28 DIAGNOSIS — R531 Weakness: Secondary | ICD-10-CM | POA: Diagnosis not present

## 2017-05-30 DIAGNOSIS — M75100 Unspecified rotator cuff tear or rupture of unspecified shoulder, not specified as traumatic: Secondary | ICD-10-CM | POA: Diagnosis not present

## 2017-05-30 DIAGNOSIS — M791 Myalgia: Secondary | ICD-10-CM | POA: Diagnosis not present

## 2017-05-30 DIAGNOSIS — M5430 Sciatica, unspecified side: Secondary | ICD-10-CM | POA: Diagnosis not present

## 2017-05-30 DIAGNOSIS — M6240 Contracture of muscle, unspecified site: Secondary | ICD-10-CM | POA: Diagnosis not present

## 2017-05-30 DIAGNOSIS — M9901 Segmental and somatic dysfunction of cervical region: Secondary | ICD-10-CM | POA: Diagnosis not present

## 2017-05-30 DIAGNOSIS — M9902 Segmental and somatic dysfunction of thoracic region: Secondary | ICD-10-CM | POA: Diagnosis not present

## 2017-05-30 DIAGNOSIS — G540 Brachial plexus disorders: Secondary | ICD-10-CM | POA: Diagnosis not present

## 2017-05-30 DIAGNOSIS — G5600 Carpal tunnel syndrome, unspecified upper limb: Secondary | ICD-10-CM | POA: Diagnosis not present

## 2017-05-30 DIAGNOSIS — M9905 Segmental and somatic dysfunction of pelvic region: Secondary | ICD-10-CM | POA: Diagnosis not present

## 2017-05-30 DIAGNOSIS — Z72820 Sleep deprivation: Secondary | ICD-10-CM | POA: Diagnosis not present

## 2017-05-30 DIAGNOSIS — M9903 Segmental and somatic dysfunction of lumbar region: Secondary | ICD-10-CM | POA: Diagnosis not present

## 2017-06-03 DIAGNOSIS — M75121 Complete rotator cuff tear or rupture of right shoulder, not specified as traumatic: Secondary | ICD-10-CM | POA: Diagnosis not present

## 2017-06-03 DIAGNOSIS — R531 Weakness: Secondary | ICD-10-CM | POA: Diagnosis not present

## 2017-06-03 DIAGNOSIS — M25511 Pain in right shoulder: Secondary | ICD-10-CM | POA: Diagnosis not present

## 2017-06-05 DIAGNOSIS — M25511 Pain in right shoulder: Secondary | ICD-10-CM | POA: Diagnosis not present

## 2017-06-05 DIAGNOSIS — M75121 Complete rotator cuff tear or rupture of right shoulder, not specified as traumatic: Secondary | ICD-10-CM | POA: Diagnosis not present

## 2017-06-05 DIAGNOSIS — R531 Weakness: Secondary | ICD-10-CM | POA: Diagnosis not present

## 2017-06-06 DIAGNOSIS — G540 Brachial plexus disorders: Secondary | ICD-10-CM | POA: Diagnosis not present

## 2017-06-06 DIAGNOSIS — M75100 Unspecified rotator cuff tear or rupture of unspecified shoulder, not specified as traumatic: Secondary | ICD-10-CM | POA: Diagnosis not present

## 2017-06-06 DIAGNOSIS — M6240 Contracture of muscle, unspecified site: Secondary | ICD-10-CM | POA: Diagnosis not present

## 2017-06-06 DIAGNOSIS — M791 Myalgia: Secondary | ICD-10-CM | POA: Diagnosis not present

## 2017-06-06 DIAGNOSIS — M5430 Sciatica, unspecified side: Secondary | ICD-10-CM | POA: Diagnosis not present

## 2017-06-06 DIAGNOSIS — G5600 Carpal tunnel syndrome, unspecified upper limb: Secondary | ICD-10-CM | POA: Diagnosis not present

## 2017-06-06 DIAGNOSIS — M9903 Segmental and somatic dysfunction of lumbar region: Secondary | ICD-10-CM | POA: Diagnosis not present

## 2017-06-06 DIAGNOSIS — M9905 Segmental and somatic dysfunction of pelvic region: Secondary | ICD-10-CM | POA: Diagnosis not present

## 2017-06-06 DIAGNOSIS — M9901 Segmental and somatic dysfunction of cervical region: Secondary | ICD-10-CM | POA: Diagnosis not present

## 2017-06-06 DIAGNOSIS — Z72820 Sleep deprivation: Secondary | ICD-10-CM | POA: Diagnosis not present

## 2017-06-06 DIAGNOSIS — M9902 Segmental and somatic dysfunction of thoracic region: Secondary | ICD-10-CM | POA: Diagnosis not present

## 2017-06-11 ENCOUNTER — Ambulatory Visit
Admission: RE | Admit: 2017-06-11 | Discharge: 2017-06-11 | Disposition: A | Discharge status: Home / Self Care | Payer: Medicare Other | Source: Ambulatory Visit | Attending: Internal Medicine | Admitting: Internal Medicine

## 2017-06-11 ENCOUNTER — Other Ambulatory Visit (HOSPITAL_COMMUNITY)
Admission: RE | Admit: 2017-06-11 | Discharge: 2017-06-11 | Disposition: A | Discharge status: Home / Self Care | Payer: Medicare Other | Source: Ambulatory Visit | Attending: General Surgery | Admitting: General Surgery

## 2017-06-11 DIAGNOSIS — E041 Nontoxic single thyroid nodule: Secondary | ICD-10-CM | POA: Diagnosis not present

## 2017-06-11 DIAGNOSIS — E0789 Other specified disorders of thyroid: Secondary | ICD-10-CM | POA: Diagnosis not present

## 2017-06-13 DIAGNOSIS — M5430 Sciatica, unspecified side: Secondary | ICD-10-CM | POA: Diagnosis not present

## 2017-06-13 DIAGNOSIS — M9902 Segmental and somatic dysfunction of thoracic region: Secondary | ICD-10-CM | POA: Diagnosis not present

## 2017-06-13 DIAGNOSIS — G5600 Carpal tunnel syndrome, unspecified upper limb: Secondary | ICD-10-CM | POA: Diagnosis not present

## 2017-06-13 DIAGNOSIS — M9905 Segmental and somatic dysfunction of pelvic region: Secondary | ICD-10-CM | POA: Diagnosis not present

## 2017-06-13 DIAGNOSIS — M9903 Segmental and somatic dysfunction of lumbar region: Secondary | ICD-10-CM | POA: Diagnosis not present

## 2017-06-13 DIAGNOSIS — G540 Brachial plexus disorders: Secondary | ICD-10-CM | POA: Diagnosis not present

## 2017-06-13 DIAGNOSIS — M791 Myalgia: Secondary | ICD-10-CM | POA: Diagnosis not present

## 2017-06-13 DIAGNOSIS — Z72820 Sleep deprivation: Secondary | ICD-10-CM | POA: Diagnosis not present

## 2017-06-13 DIAGNOSIS — R531 Weakness: Secondary | ICD-10-CM | POA: Diagnosis not present

## 2017-06-13 DIAGNOSIS — M25511 Pain in right shoulder: Secondary | ICD-10-CM | POA: Diagnosis not present

## 2017-06-13 DIAGNOSIS — M9901 Segmental and somatic dysfunction of cervical region: Secondary | ICD-10-CM | POA: Diagnosis not present

## 2017-06-13 DIAGNOSIS — M75121 Complete rotator cuff tear or rupture of right shoulder, not specified as traumatic: Secondary | ICD-10-CM | POA: Diagnosis not present

## 2017-06-13 DIAGNOSIS — M6240 Contracture of muscle, unspecified site: Secondary | ICD-10-CM | POA: Diagnosis not present

## 2017-06-13 DIAGNOSIS — M75100 Unspecified rotator cuff tear or rupture of unspecified shoulder, not specified as traumatic: Secondary | ICD-10-CM | POA: Diagnosis not present

## 2017-06-16 DIAGNOSIS — M25511 Pain in right shoulder: Secondary | ICD-10-CM | POA: Diagnosis not present

## 2017-06-16 DIAGNOSIS — R531 Weakness: Secondary | ICD-10-CM | POA: Diagnosis not present

## 2017-06-16 DIAGNOSIS — M75121 Complete rotator cuff tear or rupture of right shoulder, not specified as traumatic: Secondary | ICD-10-CM | POA: Diagnosis not present

## 2017-06-17 DIAGNOSIS — R531 Weakness: Secondary | ICD-10-CM | POA: Diagnosis not present

## 2017-06-17 DIAGNOSIS — M25511 Pain in right shoulder: Secondary | ICD-10-CM | POA: Diagnosis not present

## 2017-06-17 DIAGNOSIS — M75121 Complete rotator cuff tear or rupture of right shoulder, not specified as traumatic: Secondary | ICD-10-CM | POA: Diagnosis not present

## 2017-06-19 DIAGNOSIS — M75121 Complete rotator cuff tear or rupture of right shoulder, not specified as traumatic: Secondary | ICD-10-CM | POA: Diagnosis not present

## 2017-06-19 DIAGNOSIS — M25511 Pain in right shoulder: Secondary | ICD-10-CM | POA: Diagnosis not present

## 2017-06-19 DIAGNOSIS — R531 Weakness: Secondary | ICD-10-CM | POA: Diagnosis not present

## 2017-06-20 DIAGNOSIS — Z72820 Sleep deprivation: Secondary | ICD-10-CM | POA: Diagnosis not present

## 2017-06-20 DIAGNOSIS — G540 Brachial plexus disorders: Secondary | ICD-10-CM | POA: Diagnosis not present

## 2017-06-20 DIAGNOSIS — M9903 Segmental and somatic dysfunction of lumbar region: Secondary | ICD-10-CM | POA: Diagnosis not present

## 2017-06-20 DIAGNOSIS — M791 Myalgia: Secondary | ICD-10-CM | POA: Diagnosis not present

## 2017-06-20 DIAGNOSIS — M75100 Unspecified rotator cuff tear or rupture of unspecified shoulder, not specified as traumatic: Secondary | ICD-10-CM | POA: Diagnosis not present

## 2017-06-20 DIAGNOSIS — M5430 Sciatica, unspecified side: Secondary | ICD-10-CM | POA: Diagnosis not present

## 2017-06-20 DIAGNOSIS — M9902 Segmental and somatic dysfunction of thoracic region: Secondary | ICD-10-CM | POA: Diagnosis not present

## 2017-06-20 DIAGNOSIS — G5600 Carpal tunnel syndrome, unspecified upper limb: Secondary | ICD-10-CM | POA: Diagnosis not present

## 2017-06-20 DIAGNOSIS — M9905 Segmental and somatic dysfunction of pelvic region: Secondary | ICD-10-CM | POA: Diagnosis not present

## 2017-06-20 DIAGNOSIS — M6240 Contracture of muscle, unspecified site: Secondary | ICD-10-CM | POA: Diagnosis not present

## 2017-06-20 DIAGNOSIS — M9901 Segmental and somatic dysfunction of cervical region: Secondary | ICD-10-CM | POA: Diagnosis not present

## 2017-06-25 DIAGNOSIS — M25511 Pain in right shoulder: Secondary | ICD-10-CM | POA: Diagnosis not present

## 2017-06-25 DIAGNOSIS — M75121 Complete rotator cuff tear or rupture of right shoulder, not specified as traumatic: Secondary | ICD-10-CM | POA: Diagnosis not present

## 2017-06-25 DIAGNOSIS — R531 Weakness: Secondary | ICD-10-CM | POA: Diagnosis not present

## 2017-06-26 ENCOUNTER — Ambulatory Visit (INDEPENDENT_AMBULATORY_CARE_PROVIDER_SITE_OTHER): Payer: Medicare Other | Admitting: Neurology

## 2017-06-26 ENCOUNTER — Encounter: Payer: Self-pay | Admitting: Neurology

## 2017-06-26 VITALS — BP 130/88 | HR 85 | Resp 16 | Ht 61.0 in | Wt 140.0 lb

## 2017-06-26 DIAGNOSIS — M25511 Pain in right shoulder: Secondary | ICD-10-CM | POA: Diagnosis not present

## 2017-06-26 DIAGNOSIS — G8929 Other chronic pain: Secondary | ICD-10-CM

## 2017-06-26 DIAGNOSIS — R202 Paresthesia of skin: Secondary | ICD-10-CM

## 2017-06-26 DIAGNOSIS — M542 Cervicalgia: Secondary | ICD-10-CM | POA: Diagnosis not present

## 2017-06-26 DIAGNOSIS — M25512 Pain in left shoulder: Secondary | ICD-10-CM | POA: Diagnosis not present

## 2017-06-26 NOTE — Progress Notes (Signed)
PATIENT: Tracy Horne DOB: November 14, 1945  Chief Complaint  Patient presents with  . Right Arm Pain    Sts. she had an MRI that showed a torn right rotator cuff--is in PT and right arm pain is improved.  Still has tingling right thumb, middle and ring fingers.  Only tood Valtrex and Cymbalta for 2 wks--didn't note improvement in tingling in that time. She stopped meds after MRI showed problem with rotator cuff/fim     HISTORICAL  Tracy Horne is a 71 years old right-handed female, seen in refer by her primary care and rheumatologist from [place] Dr.Syed, Dawayne Patricia, and Dr. Maudie Mercury, Jeneen Rinks evaluation of right arm pain, initial evaluation was on April 24 2017.  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  MRI of cervical spine on Apr 02 2017, mild degenerative changes, without canal or foraminal stenosis.  Since April 2018, she began to experience recurrent radiating pain from right shoulder blade travel along right inner arm, forearm, involving right hand, initially at the right first 3 fingers, now seems to involving all 5 fingers, also subjective weakness, difficulty opening a jar   At the beginning of the April, she also noticed right arm deep achy pain, there was no rash broke out, pain has much improved, she still has intermittent paresthesia,   She denies gait abnormality no left arm involvement.  UPDATE June 26 2017: MRI of right shoulder in July 2018 showed normal-appearing brachioplexus, rotator cuff tendinopathy with  some large full-thickness tearing of the right supraspinatus, measuring at least 1 cm from the found to the back,  She has received physical therapy, with some relief of right shoulder pain, but she continues to complains of right hand paresthesia, involving first 3 fingers, subjective weakness,  EMG nerve conduction study in September 2017 showed no evidence of right carpal tunnel syndrome or right cervical radiculopathy  She has right wrist Tinel sign on today's exam  REVIEW OF SYSTEMS: Full 14 system review of systems performed and notable only for as above  ALLERGIES: Allergies  Allergen Reactions  . Felodipine     HOME MEDICATIONS: Current Outpatient Prescriptions  Medication Sig Dispense Refill  . acetaminophen (TYLENOL) 325 MG tablet Take 650 mg by mouth every 6 (six) hours as needed.    Marland Kitchen amLODipine (NORVASC) 2.5 MG tablet amlodipine 2.5 mg tablet    . Biotin 1 MG CAPS biotin    . telmisartan-hydrochlorothiazide (MICARDIS HCT) 80-12.5 MG tablet telmisartan 80 mg-hydrochlorothiazide 12.5 mg tablet    . DULoxetine (CYMBALTA) 30 MG capsule Take 1 capsule (30 mg total) by mouth daily. (Patient not taking: Reported on 06/26/2017) 30 capsule 6  . valACYclovir (VALTREX) 500 MG tablet Take 2 tablets (1,000 mg total) by mouth 2 (two) times daily. (Patient not taking: Reported on 06/26/2017) 20 tablet 0   No current facility-administered medications for this visit.     PAST MEDICAL HISTORY: Past Medical History:  Diagnosis Date  . Hypertension     PAST SURGICAL HISTORY: Past Surgical History:  Procedure Laterality Date  . BREAST EXCISIONAL BIOPSY Left   .  PILONIDAL CYST EXCISION      FAMILY HISTORY: Family History  Problem Relation Age of Onset  . Heart attack Mother   . Heart failure Father   . High blood pressure Brother   . Breast cancer Neg Hx     SOCIAL HISTORY:  Social History   Social History  . Marital status: Married    Spouse name: N/A  . Number of  children: 1  . Years of education: N/A   Occupational History  . Retired    Social History Main Topics  . Smoking status: Passive Smoke Exposure - Never Smoker  . Smokeless tobacco: Never Used  . Alcohol use No  . Drug use: No  . Sexual activity: Not on file   Other Topics Concern  . Not on file   Social History Narrative   Lives at home w/ her family   Right-handed   Caffeine: 2 cups of coffee per day, occasional sweet tea     PHYSICAL EXAM   Vitals:   06/26/17 1129  BP: 130/88  Pulse: 85  Resp: 16  Weight: 140 lb (63.5 kg)  Height: _0  (1.549 m)    Not recorded      Body mass index is 26.45 kg/m.  PHYSICAL EXAMNIATION:  Gen: NAD, conversant, well nourised, obese, well groomed                     Cardiovascular: Regular rate rhythm, no peripheral edema, warm, nontender. Eyes: Conjunctivae clear without exudates or hemorrhage Neck: Supple, no carotid bruits. Pulmonary: Clear to auscultation bilaterally   NEUROLOGICAL EXAM:  MENTAL STATUS: Speech:    Speech is normal; fluent and spontaneous with normal comprehension.  Cognition:     Orientation to time, place and person     Normal recent and remote memory     Normal Attention span and concentration     Normal Language, naming, repeating,spontaneous speech     Fund of knowledge   CRANIAL NERVES: CN II: Visual fields are full to confrontation. Fundoscopic exam is normal with sharp discs and no vascular changes. Pupils are round equal and briskly reactive to light. CN III, IV, VI: extraocular movement are normal. No ptosis. CN V: Facial sensation is intact to pinprick in all 3 divisions bilaterally. Corneal responses are intact.  CN VII: Face is symmetric with normal eye closure and smile. CN VIII: Hearing is normal to rubbing fingers CN IX, X: Palate elevates symmetrically. Phonation is normal. CN XI: Head turning and shoulder shrug are intact CN XII: Tongue is midline with normal movements and no  atrophy.  MOTOR: There is no pronator drift of out-stretched arms. Muscle bulk and tone are normal. Muscle strength is normal.  REFLEXES: Reflexes are 2+ and symmetric at the biceps, triceps, knees, and ankles. Plantar responses are flexor.  SENSORY: Intact to light touch, pinprick, positional sensation and vibratory sensation are intact in fingers and toes.  COORDINATION: Rapid alternating movements and fine finger movements are intact. There is no dysmetria on finger-to-nose and heel-knee-shin.    GAIT/STANCE: Posture is normal. Gait is steady with normal steps, base, arm swing, and turning. Heel and toe walking are normal. Tandem gait is normal.  Romberg is absent.   DIAGNOSTIC DATA (LABS, IMAGING, TESTING) - I reviewed patient records, labs, notes, testing and imaging myself where available.   ASSESSMENT AND PLAN  Tiane Szydlowski is a 71 y.o. female   Right shoulder pain, radiating pain at the right C8,T 1  distribution, subjective mild right finger weakness,  Right rotator cuff syndrome  Possible mild right carpal tunnel syndrome  When necessary NADIS, right wrist splint  Marcial Pacas, M.D. Ph.D.  [facility] [address], [place], [place] [address] Ph: [phone] Fax: [phone]  CC: Valinda Party, MD, Jani Gravel, MD

## 2017-06-27 DIAGNOSIS — G5600 Carpal tunnel syndrome, unspecified upper limb: Secondary | ICD-10-CM | POA: Diagnosis not present

## 2017-06-27 DIAGNOSIS — M9902 Segmental and somatic dysfunction of thoracic region: Secondary | ICD-10-CM | POA: Diagnosis not present

## 2017-06-27 DIAGNOSIS — M9903 Segmental and somatic dysfunction of lumbar region: Secondary | ICD-10-CM | POA: Diagnosis not present

## 2017-06-27 DIAGNOSIS — M75100 Unspecified rotator cuff tear or rupture of unspecified shoulder, not specified as traumatic: Secondary | ICD-10-CM | POA: Diagnosis not present

## 2017-06-27 DIAGNOSIS — M9905 Segmental and somatic dysfunction of pelvic region: Secondary | ICD-10-CM | POA: Diagnosis not present

## 2017-06-27 DIAGNOSIS — Z72820 Sleep deprivation: Secondary | ICD-10-CM | POA: Diagnosis not present

## 2017-06-27 DIAGNOSIS — G540 Brachial plexus disorders: Secondary | ICD-10-CM | POA: Diagnosis not present

## 2017-06-27 DIAGNOSIS — M791 Myalgia: Secondary | ICD-10-CM | POA: Diagnosis not present

## 2017-06-27 DIAGNOSIS — M5430 Sciatica, unspecified side: Secondary | ICD-10-CM | POA: Diagnosis not present

## 2017-06-27 DIAGNOSIS — M9901 Segmental and somatic dysfunction of cervical region: Secondary | ICD-10-CM | POA: Diagnosis not present

## 2017-06-27 DIAGNOSIS — M6240 Contracture of muscle, unspecified site: Secondary | ICD-10-CM | POA: Diagnosis not present

## 2017-07-02 DIAGNOSIS — M75121 Complete rotator cuff tear or rupture of right shoulder, not specified as traumatic: Secondary | ICD-10-CM | POA: Diagnosis not present

## 2017-07-02 DIAGNOSIS — M67912 Unspecified disorder of synovium and tendon, left shoulder: Secondary | ICD-10-CM | POA: Diagnosis not present

## 2017-07-07 DIAGNOSIS — M75121 Complete rotator cuff tear or rupture of right shoulder, not specified as traumatic: Secondary | ICD-10-CM | POA: Diagnosis not present

## 2017-07-07 DIAGNOSIS — R531 Weakness: Secondary | ICD-10-CM | POA: Diagnosis not present

## 2017-07-07 DIAGNOSIS — M25511 Pain in right shoulder: Secondary | ICD-10-CM | POA: Diagnosis not present

## 2017-07-09 DIAGNOSIS — R531 Weakness: Secondary | ICD-10-CM | POA: Diagnosis not present

## 2017-07-09 DIAGNOSIS — M75121 Complete rotator cuff tear or rupture of right shoulder, not specified as traumatic: Secondary | ICD-10-CM | POA: Diagnosis not present

## 2017-07-09 DIAGNOSIS — M25511 Pain in right shoulder: Secondary | ICD-10-CM | POA: Diagnosis not present

## 2017-07-15 DIAGNOSIS — R531 Weakness: Secondary | ICD-10-CM | POA: Diagnosis not present

## 2017-07-15 DIAGNOSIS — M75121 Complete rotator cuff tear or rupture of right shoulder, not specified as traumatic: Secondary | ICD-10-CM | POA: Diagnosis not present

## 2017-07-15 DIAGNOSIS — M25511 Pain in right shoulder: Secondary | ICD-10-CM | POA: Diagnosis not present

## 2017-07-16 DIAGNOSIS — M5430 Sciatica, unspecified side: Secondary | ICD-10-CM | POA: Diagnosis not present

## 2017-07-16 DIAGNOSIS — G540 Brachial plexus disorders: Secondary | ICD-10-CM | POA: Diagnosis not present

## 2017-07-16 DIAGNOSIS — M6240 Contracture of muscle, unspecified site: Secondary | ICD-10-CM | POA: Diagnosis not present

## 2017-07-16 DIAGNOSIS — M9901 Segmental and somatic dysfunction of cervical region: Secondary | ICD-10-CM | POA: Diagnosis not present

## 2017-07-16 DIAGNOSIS — M791 Myalgia: Secondary | ICD-10-CM | POA: Diagnosis not present

## 2017-07-16 DIAGNOSIS — Z72820 Sleep deprivation: Secondary | ICD-10-CM | POA: Diagnosis not present

## 2017-07-16 DIAGNOSIS — M9903 Segmental and somatic dysfunction of lumbar region: Secondary | ICD-10-CM | POA: Diagnosis not present

## 2017-07-16 DIAGNOSIS — G5600 Carpal tunnel syndrome, unspecified upper limb: Secondary | ICD-10-CM | POA: Diagnosis not present

## 2017-07-16 DIAGNOSIS — M9905 Segmental and somatic dysfunction of pelvic region: Secondary | ICD-10-CM | POA: Diagnosis not present

## 2017-07-16 DIAGNOSIS — M75100 Unspecified rotator cuff tear or rupture of unspecified shoulder, not specified as traumatic: Secondary | ICD-10-CM | POA: Diagnosis not present

## 2017-07-16 DIAGNOSIS — M9902 Segmental and somatic dysfunction of thoracic region: Secondary | ICD-10-CM | POA: Diagnosis not present

## 2017-07-17 DIAGNOSIS — M25511 Pain in right shoulder: Secondary | ICD-10-CM | POA: Diagnosis not present

## 2017-07-17 DIAGNOSIS — M75121 Complete rotator cuff tear or rupture of right shoulder, not specified as traumatic: Secondary | ICD-10-CM | POA: Diagnosis not present

## 2017-07-17 DIAGNOSIS — R531 Weakness: Secondary | ICD-10-CM | POA: Diagnosis not present

## 2017-07-21 DIAGNOSIS — M6240 Contracture of muscle, unspecified site: Secondary | ICD-10-CM | POA: Diagnosis not present

## 2017-07-21 DIAGNOSIS — G540 Brachial plexus disorders: Secondary | ICD-10-CM | POA: Diagnosis not present

## 2017-07-21 DIAGNOSIS — M75121 Complete rotator cuff tear or rupture of right shoulder, not specified as traumatic: Secondary | ICD-10-CM | POA: Diagnosis not present

## 2017-07-21 DIAGNOSIS — R531 Weakness: Secondary | ICD-10-CM | POA: Diagnosis not present

## 2017-07-21 DIAGNOSIS — M9902 Segmental and somatic dysfunction of thoracic region: Secondary | ICD-10-CM | POA: Diagnosis not present

## 2017-07-21 DIAGNOSIS — M5430 Sciatica, unspecified side: Secondary | ICD-10-CM | POA: Diagnosis not present

## 2017-07-21 DIAGNOSIS — G5600 Carpal tunnel syndrome, unspecified upper limb: Secondary | ICD-10-CM | POA: Diagnosis not present

## 2017-07-21 DIAGNOSIS — M75100 Unspecified rotator cuff tear or rupture of unspecified shoulder, not specified as traumatic: Secondary | ICD-10-CM | POA: Diagnosis not present

## 2017-07-21 DIAGNOSIS — M9903 Segmental and somatic dysfunction of lumbar region: Secondary | ICD-10-CM | POA: Diagnosis not present

## 2017-07-21 DIAGNOSIS — M25511 Pain in right shoulder: Secondary | ICD-10-CM | POA: Diagnosis not present

## 2017-07-21 DIAGNOSIS — M9905 Segmental and somatic dysfunction of pelvic region: Secondary | ICD-10-CM | POA: Diagnosis not present

## 2017-07-21 DIAGNOSIS — M9901 Segmental and somatic dysfunction of cervical region: Secondary | ICD-10-CM | POA: Diagnosis not present

## 2017-07-21 DIAGNOSIS — Z72820 Sleep deprivation: Secondary | ICD-10-CM | POA: Diagnosis not present

## 2017-07-21 DIAGNOSIS — M791 Myalgia: Secondary | ICD-10-CM | POA: Diagnosis not present

## 2017-07-23 DIAGNOSIS — M25511 Pain in right shoulder: Secondary | ICD-10-CM | POA: Diagnosis not present

## 2017-07-23 DIAGNOSIS — M7542 Impingement syndrome of left shoulder: Secondary | ICD-10-CM | POA: Diagnosis not present

## 2017-07-23 DIAGNOSIS — M75121 Complete rotator cuff tear or rupture of right shoulder, not specified as traumatic: Secondary | ICD-10-CM | POA: Diagnosis not present

## 2017-07-23 DIAGNOSIS — R531 Weakness: Secondary | ICD-10-CM | POA: Diagnosis not present

## 2017-07-28 DIAGNOSIS — M9902 Segmental and somatic dysfunction of thoracic region: Secondary | ICD-10-CM | POA: Diagnosis not present

## 2017-07-28 DIAGNOSIS — M791 Myalgia: Secondary | ICD-10-CM | POA: Diagnosis not present

## 2017-07-28 DIAGNOSIS — M75100 Unspecified rotator cuff tear or rupture of unspecified shoulder, not specified as traumatic: Secondary | ICD-10-CM | POA: Diagnosis not present

## 2017-07-28 DIAGNOSIS — Z72820 Sleep deprivation: Secondary | ICD-10-CM | POA: Diagnosis not present

## 2017-07-28 DIAGNOSIS — M9905 Segmental and somatic dysfunction of pelvic region: Secondary | ICD-10-CM | POA: Diagnosis not present

## 2017-07-28 DIAGNOSIS — M9901 Segmental and somatic dysfunction of cervical region: Secondary | ICD-10-CM | POA: Diagnosis not present

## 2017-07-28 DIAGNOSIS — M6240 Contracture of muscle, unspecified site: Secondary | ICD-10-CM | POA: Diagnosis not present

## 2017-07-28 DIAGNOSIS — G540 Brachial plexus disorders: Secondary | ICD-10-CM | POA: Diagnosis not present

## 2017-07-28 DIAGNOSIS — G5600 Carpal tunnel syndrome, unspecified upper limb: Secondary | ICD-10-CM | POA: Diagnosis not present

## 2017-07-28 DIAGNOSIS — M5430 Sciatica, unspecified side: Secondary | ICD-10-CM | POA: Diagnosis not present

## 2017-07-28 DIAGNOSIS — M9903 Segmental and somatic dysfunction of lumbar region: Secondary | ICD-10-CM | POA: Diagnosis not present

## 2017-07-29 DIAGNOSIS — M7542 Impingement syndrome of left shoulder: Secondary | ICD-10-CM | POA: Diagnosis not present

## 2017-07-29 DIAGNOSIS — M25511 Pain in right shoulder: Secondary | ICD-10-CM | POA: Diagnosis not present

## 2017-07-29 DIAGNOSIS — M75121 Complete rotator cuff tear or rupture of right shoulder, not specified as traumatic: Secondary | ICD-10-CM | POA: Diagnosis not present

## 2017-07-29 DIAGNOSIS — R531 Weakness: Secondary | ICD-10-CM | POA: Diagnosis not present

## 2017-07-31 DIAGNOSIS — M7542 Impingement syndrome of left shoulder: Secondary | ICD-10-CM | POA: Diagnosis not present

## 2017-07-31 DIAGNOSIS — R531 Weakness: Secondary | ICD-10-CM | POA: Diagnosis not present

## 2017-07-31 DIAGNOSIS — M75121 Complete rotator cuff tear or rupture of right shoulder, not specified as traumatic: Secondary | ICD-10-CM | POA: Diagnosis not present

## 2017-07-31 DIAGNOSIS — M25511 Pain in right shoulder: Secondary | ICD-10-CM | POA: Diagnosis not present

## 2017-08-04 DIAGNOSIS — M791 Myalgia: Secondary | ICD-10-CM | POA: Diagnosis not present

## 2017-08-04 DIAGNOSIS — M75100 Unspecified rotator cuff tear or rupture of unspecified shoulder, not specified as traumatic: Secondary | ICD-10-CM | POA: Diagnosis not present

## 2017-08-04 DIAGNOSIS — M9903 Segmental and somatic dysfunction of lumbar region: Secondary | ICD-10-CM | POA: Diagnosis not present

## 2017-08-04 DIAGNOSIS — Z72820 Sleep deprivation: Secondary | ICD-10-CM | POA: Diagnosis not present

## 2017-08-04 DIAGNOSIS — G5600 Carpal tunnel syndrome, unspecified upper limb: Secondary | ICD-10-CM | POA: Diagnosis not present

## 2017-08-04 DIAGNOSIS — M9905 Segmental and somatic dysfunction of pelvic region: Secondary | ICD-10-CM | POA: Diagnosis not present

## 2017-08-04 DIAGNOSIS — M9901 Segmental and somatic dysfunction of cervical region: Secondary | ICD-10-CM | POA: Diagnosis not present

## 2017-08-04 DIAGNOSIS — M5430 Sciatica, unspecified side: Secondary | ICD-10-CM | POA: Diagnosis not present

## 2017-08-04 DIAGNOSIS — M6240 Contracture of muscle, unspecified site: Secondary | ICD-10-CM | POA: Diagnosis not present

## 2017-08-04 DIAGNOSIS — M9902 Segmental and somatic dysfunction of thoracic region: Secondary | ICD-10-CM | POA: Diagnosis not present

## 2017-08-04 DIAGNOSIS — G540 Brachial plexus disorders: Secondary | ICD-10-CM | POA: Diagnosis not present

## 2017-08-05 DIAGNOSIS — M7542 Impingement syndrome of left shoulder: Secondary | ICD-10-CM | POA: Diagnosis not present

## 2017-08-05 DIAGNOSIS — M75121 Complete rotator cuff tear or rupture of right shoulder, not specified as traumatic: Secondary | ICD-10-CM | POA: Diagnosis not present

## 2017-08-05 DIAGNOSIS — M25511 Pain in right shoulder: Secondary | ICD-10-CM | POA: Diagnosis not present

## 2017-08-05 DIAGNOSIS — R531 Weakness: Secondary | ICD-10-CM | POA: Diagnosis not present

## 2017-08-07 DIAGNOSIS — M25511 Pain in right shoulder: Secondary | ICD-10-CM | POA: Diagnosis not present

## 2017-08-07 DIAGNOSIS — M75121 Complete rotator cuff tear or rupture of right shoulder, not specified as traumatic: Secondary | ICD-10-CM | POA: Diagnosis not present

## 2017-08-07 DIAGNOSIS — R531 Weakness: Secondary | ICD-10-CM | POA: Diagnosis not present

## 2017-08-07 DIAGNOSIS — M7542 Impingement syndrome of left shoulder: Secondary | ICD-10-CM | POA: Diagnosis not present

## 2017-08-08 ENCOUNTER — Other Ambulatory Visit: Payer: Self-pay | Admitting: Neurology

## 2017-08-11 DIAGNOSIS — M9902 Segmental and somatic dysfunction of thoracic region: Secondary | ICD-10-CM | POA: Diagnosis not present

## 2017-08-11 DIAGNOSIS — M7542 Impingement syndrome of left shoulder: Secondary | ICD-10-CM | POA: Diagnosis not present

## 2017-08-11 DIAGNOSIS — M791 Myalgia, unspecified site: Secondary | ICD-10-CM | POA: Diagnosis not present

## 2017-08-11 DIAGNOSIS — M6240 Contracture of muscle, unspecified site: Secondary | ICD-10-CM | POA: Diagnosis not present

## 2017-08-11 DIAGNOSIS — G540 Brachial plexus disorders: Secondary | ICD-10-CM | POA: Diagnosis not present

## 2017-08-11 DIAGNOSIS — M9905 Segmental and somatic dysfunction of pelvic region: Secondary | ICD-10-CM | POA: Diagnosis not present

## 2017-08-11 DIAGNOSIS — Z72820 Sleep deprivation: Secondary | ICD-10-CM | POA: Diagnosis not present

## 2017-08-11 DIAGNOSIS — M9901 Segmental and somatic dysfunction of cervical region: Secondary | ICD-10-CM | POA: Diagnosis not present

## 2017-08-11 DIAGNOSIS — M75100 Unspecified rotator cuff tear or rupture of unspecified shoulder, not specified as traumatic: Secondary | ICD-10-CM | POA: Diagnosis not present

## 2017-08-11 DIAGNOSIS — M5430 Sciatica, unspecified side: Secondary | ICD-10-CM | POA: Diagnosis not present

## 2017-08-11 DIAGNOSIS — M25511 Pain in right shoulder: Secondary | ICD-10-CM | POA: Diagnosis not present

## 2017-08-11 DIAGNOSIS — M75121 Complete rotator cuff tear or rupture of right shoulder, not specified as traumatic: Secondary | ICD-10-CM | POA: Diagnosis not present

## 2017-08-11 DIAGNOSIS — G5600 Carpal tunnel syndrome, unspecified upper limb: Secondary | ICD-10-CM | POA: Diagnosis not present

## 2017-08-11 DIAGNOSIS — R531 Weakness: Secondary | ICD-10-CM | POA: Diagnosis not present

## 2017-08-11 DIAGNOSIS — M9903 Segmental and somatic dysfunction of lumbar region: Secondary | ICD-10-CM | POA: Diagnosis not present

## 2017-08-13 DIAGNOSIS — M75121 Complete rotator cuff tear or rupture of right shoulder, not specified as traumatic: Secondary | ICD-10-CM | POA: Diagnosis not present

## 2017-08-13 DIAGNOSIS — M67912 Unspecified disorder of synovium and tendon, left shoulder: Secondary | ICD-10-CM | POA: Diagnosis not present

## 2017-08-13 DIAGNOSIS — G5601 Carpal tunnel syndrome, right upper limb: Secondary | ICD-10-CM | POA: Diagnosis not present

## 2017-08-14 DIAGNOSIS — M7542 Impingement syndrome of left shoulder: Secondary | ICD-10-CM | POA: Diagnosis not present

## 2017-08-14 DIAGNOSIS — R531 Weakness: Secondary | ICD-10-CM | POA: Diagnosis not present

## 2017-08-14 DIAGNOSIS — M75121 Complete rotator cuff tear or rupture of right shoulder, not specified as traumatic: Secondary | ICD-10-CM | POA: Diagnosis not present

## 2017-08-14 DIAGNOSIS — M25511 Pain in right shoulder: Secondary | ICD-10-CM | POA: Diagnosis not present

## 2017-08-15 ENCOUNTER — Other Ambulatory Visit: Payer: Self-pay | Admitting: Neurology

## 2017-08-18 DIAGNOSIS — M546 Pain in thoracic spine: Secondary | ICD-10-CM | POA: Diagnosis not present

## 2017-08-18 DIAGNOSIS — M545 Low back pain: Secondary | ICD-10-CM | POA: Diagnosis not present

## 2017-08-19 DIAGNOSIS — M25511 Pain in right shoulder: Secondary | ICD-10-CM | POA: Diagnosis not present

## 2017-08-19 DIAGNOSIS — M75121 Complete rotator cuff tear or rupture of right shoulder, not specified as traumatic: Secondary | ICD-10-CM | POA: Diagnosis not present

## 2017-08-19 DIAGNOSIS — R531 Weakness: Secondary | ICD-10-CM | POA: Diagnosis not present

## 2017-08-19 DIAGNOSIS — M7542 Impingement syndrome of left shoulder: Secondary | ICD-10-CM | POA: Diagnosis not present

## 2017-08-26 DIAGNOSIS — M75121 Complete rotator cuff tear or rupture of right shoulder, not specified as traumatic: Secondary | ICD-10-CM | POA: Diagnosis not present

## 2017-08-26 DIAGNOSIS — M7542 Impingement syndrome of left shoulder: Secondary | ICD-10-CM | POA: Diagnosis not present

## 2017-08-26 DIAGNOSIS — R531 Weakness: Secondary | ICD-10-CM | POA: Diagnosis not present

## 2017-08-26 DIAGNOSIS — M25511 Pain in right shoulder: Secondary | ICD-10-CM | POA: Diagnosis not present

## 2017-08-29 DIAGNOSIS — M25511 Pain in right shoulder: Secondary | ICD-10-CM | POA: Diagnosis not present

## 2017-08-29 DIAGNOSIS — R531 Weakness: Secondary | ICD-10-CM | POA: Diagnosis not present

## 2017-08-29 DIAGNOSIS — M7542 Impingement syndrome of left shoulder: Secondary | ICD-10-CM | POA: Diagnosis not present

## 2017-08-29 DIAGNOSIS — M75121 Complete rotator cuff tear or rupture of right shoulder, not specified as traumatic: Secondary | ICD-10-CM | POA: Diagnosis not present

## 2017-09-02 DIAGNOSIS — M75121 Complete rotator cuff tear or rupture of right shoulder, not specified as traumatic: Secondary | ICD-10-CM | POA: Diagnosis not present

## 2017-09-02 DIAGNOSIS — M25511 Pain in right shoulder: Secondary | ICD-10-CM | POA: Diagnosis not present

## 2017-09-02 DIAGNOSIS — M7542 Impingement syndrome of left shoulder: Secondary | ICD-10-CM | POA: Diagnosis not present

## 2017-09-02 DIAGNOSIS — R531 Weakness: Secondary | ICD-10-CM | POA: Diagnosis not present

## 2017-09-09 DIAGNOSIS — R531 Weakness: Secondary | ICD-10-CM | POA: Diagnosis not present

## 2017-09-09 DIAGNOSIS — M7542 Impingement syndrome of left shoulder: Secondary | ICD-10-CM | POA: Diagnosis not present

## 2017-09-09 DIAGNOSIS — M75121 Complete rotator cuff tear or rupture of right shoulder, not specified as traumatic: Secondary | ICD-10-CM | POA: Diagnosis not present

## 2017-09-09 DIAGNOSIS — M25511 Pain in right shoulder: Secondary | ICD-10-CM | POA: Diagnosis not present

## 2017-09-11 DIAGNOSIS — M7542 Impingement syndrome of left shoulder: Secondary | ICD-10-CM | POA: Diagnosis not present

## 2017-09-11 DIAGNOSIS — M75121 Complete rotator cuff tear or rupture of right shoulder, not specified as traumatic: Secondary | ICD-10-CM | POA: Diagnosis not present

## 2017-09-11 DIAGNOSIS — R531 Weakness: Secondary | ICD-10-CM | POA: Diagnosis not present

## 2017-09-11 DIAGNOSIS — M25511 Pain in right shoulder: Secondary | ICD-10-CM | POA: Diagnosis not present

## 2017-09-16 DIAGNOSIS — Z23 Encounter for immunization: Secondary | ICD-10-CM | POA: Diagnosis not present

## 2017-09-18 DIAGNOSIS — M7542 Impingement syndrome of left shoulder: Secondary | ICD-10-CM | POA: Diagnosis not present

## 2017-09-18 DIAGNOSIS — R531 Weakness: Secondary | ICD-10-CM | POA: Diagnosis not present

## 2017-09-18 DIAGNOSIS — M75121 Complete rotator cuff tear or rupture of right shoulder, not specified as traumatic: Secondary | ICD-10-CM | POA: Diagnosis not present

## 2017-09-18 DIAGNOSIS — M25511 Pain in right shoulder: Secondary | ICD-10-CM | POA: Diagnosis not present

## 2017-09-24 DIAGNOSIS — M65311 Trigger thumb, right thumb: Secondary | ICD-10-CM | POA: Diagnosis not present

## 2017-09-24 DIAGNOSIS — M65341 Trigger finger, right ring finger: Secondary | ICD-10-CM | POA: Diagnosis not present

## 2017-10-13 DIAGNOSIS — E7800 Pure hypercholesterolemia, unspecified: Secondary | ICD-10-CM | POA: Diagnosis not present

## 2017-10-17 DIAGNOSIS — I10 Essential (primary) hypertension: Secondary | ICD-10-CM | POA: Diagnosis not present

## 2017-10-17 DIAGNOSIS — E559 Vitamin D deficiency, unspecified: Secondary | ICD-10-CM | POA: Diagnosis not present

## 2017-10-17 DIAGNOSIS — F419 Anxiety disorder, unspecified: Secondary | ICD-10-CM | POA: Diagnosis not present

## 2017-10-17 DIAGNOSIS — E7800 Pure hypercholesterolemia, unspecified: Secondary | ICD-10-CM | POA: Diagnosis not present

## 2017-10-23 DIAGNOSIS — Z6827 Body mass index (BMI) 27.0-27.9, adult: Secondary | ICD-10-CM | POA: Diagnosis not present

## 2017-10-23 DIAGNOSIS — N952 Postmenopausal atrophic vaginitis: Secondary | ICD-10-CM | POA: Diagnosis not present

## 2017-10-23 DIAGNOSIS — Z01419 Encounter for gynecological examination (general) (routine) without abnormal findings: Secondary | ICD-10-CM | POA: Diagnosis not present

## 2018-01-15 DIAGNOSIS — M25511 Pain in right shoulder: Secondary | ICD-10-CM | POA: Diagnosis not present

## 2018-01-16 DIAGNOSIS — M75121 Complete rotator cuff tear or rupture of right shoulder, not specified as traumatic: Secondary | ICD-10-CM | POA: Diagnosis not present

## 2018-01-16 DIAGNOSIS — M5412 Radiculopathy, cervical region: Secondary | ICD-10-CM | POA: Diagnosis not present

## 2018-01-16 DIAGNOSIS — M25511 Pain in right shoulder: Secondary | ICD-10-CM | POA: Diagnosis not present

## 2018-01-16 DIAGNOSIS — S46811D Strain of other muscles, fascia and tendons at shoulder and upper arm level, right arm, subsequent encounter: Secondary | ICD-10-CM | POA: Diagnosis not present

## 2018-01-19 DIAGNOSIS — M79601 Pain in right arm: Secondary | ICD-10-CM | POA: Diagnosis not present

## 2018-01-19 DIAGNOSIS — M25511 Pain in right shoulder: Secondary | ICD-10-CM | POA: Diagnosis not present

## 2018-01-19 DIAGNOSIS — S46811D Strain of other muscles, fascia and tendons at shoulder and upper arm level, right arm, subsequent encounter: Secondary | ICD-10-CM | POA: Diagnosis not present

## 2018-01-19 DIAGNOSIS — M5412 Radiculopathy, cervical region: Secondary | ICD-10-CM | POA: Diagnosis not present

## 2018-01-19 DIAGNOSIS — M75121 Complete rotator cuff tear or rupture of right shoulder, not specified as traumatic: Secondary | ICD-10-CM | POA: Diagnosis not present

## 2018-01-22 DIAGNOSIS — M5412 Radiculopathy, cervical region: Secondary | ICD-10-CM | POA: Diagnosis not present

## 2018-01-22 DIAGNOSIS — M75121 Complete rotator cuff tear or rupture of right shoulder, not specified as traumatic: Secondary | ICD-10-CM | POA: Diagnosis not present

## 2018-01-22 DIAGNOSIS — M25511 Pain in right shoulder: Secondary | ICD-10-CM | POA: Diagnosis not present

## 2018-01-22 DIAGNOSIS — S46811D Strain of other muscles, fascia and tendons at shoulder and upper arm level, right arm, subsequent encounter: Secondary | ICD-10-CM | POA: Diagnosis not present

## 2018-01-26 DIAGNOSIS — M75121 Complete rotator cuff tear or rupture of right shoulder, not specified as traumatic: Secondary | ICD-10-CM | POA: Diagnosis not present

## 2018-01-26 DIAGNOSIS — M5412 Radiculopathy, cervical region: Secondary | ICD-10-CM | POA: Diagnosis not present

## 2018-01-26 DIAGNOSIS — S46811D Strain of other muscles, fascia and tendons at shoulder and upper arm level, right arm, subsequent encounter: Secondary | ICD-10-CM | POA: Diagnosis not present

## 2018-01-26 DIAGNOSIS — M25511 Pain in right shoulder: Secondary | ICD-10-CM | POA: Diagnosis not present

## 2018-01-29 DIAGNOSIS — M5412 Radiculopathy, cervical region: Secondary | ICD-10-CM | POA: Diagnosis not present

## 2018-01-29 DIAGNOSIS — M25511 Pain in right shoulder: Secondary | ICD-10-CM | POA: Diagnosis not present

## 2018-01-29 DIAGNOSIS — M75121 Complete rotator cuff tear or rupture of right shoulder, not specified as traumatic: Secondary | ICD-10-CM | POA: Diagnosis not present

## 2018-01-29 DIAGNOSIS — S46811D Strain of other muscles, fascia and tendons at shoulder and upper arm level, right arm, subsequent encounter: Secondary | ICD-10-CM | POA: Diagnosis not present

## 2018-02-16 DIAGNOSIS — M65312 Trigger thumb, left thumb: Secondary | ICD-10-CM | POA: Diagnosis not present

## 2018-02-19 DIAGNOSIS — S46811D Strain of other muscles, fascia and tendons at shoulder and upper arm level, right arm, subsequent encounter: Secondary | ICD-10-CM | POA: Diagnosis not present

## 2018-02-19 DIAGNOSIS — M25511 Pain in right shoulder: Secondary | ICD-10-CM | POA: Diagnosis not present

## 2018-02-19 DIAGNOSIS — M5412 Radiculopathy, cervical region: Secondary | ICD-10-CM | POA: Diagnosis not present

## 2018-02-19 DIAGNOSIS — M75121 Complete rotator cuff tear or rupture of right shoulder, not specified as traumatic: Secondary | ICD-10-CM | POA: Diagnosis not present

## 2018-02-25 DIAGNOSIS — Z9889 Other specified postprocedural states: Secondary | ICD-10-CM | POA: Diagnosis not present

## 2018-02-25 DIAGNOSIS — G5601 Carpal tunnel syndrome, right upper limb: Secondary | ICD-10-CM | POA: Diagnosis not present

## 2018-03-03 DIAGNOSIS — S46811D Strain of other muscles, fascia and tendons at shoulder and upper arm level, right arm, subsequent encounter: Secondary | ICD-10-CM | POA: Diagnosis not present

## 2018-03-03 DIAGNOSIS — M75121 Complete rotator cuff tear or rupture of right shoulder, not specified as traumatic: Secondary | ICD-10-CM | POA: Diagnosis not present

## 2018-03-03 DIAGNOSIS — M25511 Pain in right shoulder: Secondary | ICD-10-CM | POA: Diagnosis not present

## 2018-03-03 DIAGNOSIS — M5412 Radiculopathy, cervical region: Secondary | ICD-10-CM | POA: Diagnosis not present

## 2018-03-16 DIAGNOSIS — R05 Cough: Secondary | ICD-10-CM | POA: Diagnosis not present

## 2018-03-16 DIAGNOSIS — J309 Allergic rhinitis, unspecified: Secondary | ICD-10-CM | POA: Diagnosis not present

## 2018-03-16 DIAGNOSIS — J069 Acute upper respiratory infection, unspecified: Secondary | ICD-10-CM | POA: Diagnosis not present

## 2018-03-16 DIAGNOSIS — G5601 Carpal tunnel syndrome, right upper limb: Secondary | ICD-10-CM | POA: Diagnosis not present

## 2018-03-16 DIAGNOSIS — E785 Hyperlipidemia, unspecified: Secondary | ICD-10-CM | POA: Diagnosis not present

## 2018-03-16 DIAGNOSIS — J0100 Acute maxillary sinusitis, unspecified: Secondary | ICD-10-CM | POA: Diagnosis not present

## 2018-03-16 DIAGNOSIS — I129 Hypertensive chronic kidney disease with stage 1 through stage 4 chronic kidney disease, or unspecified chronic kidney disease: Secondary | ICD-10-CM | POA: Diagnosis not present

## 2018-03-24 DIAGNOSIS — G5601 Carpal tunnel syndrome, right upper limb: Secondary | ICD-10-CM | POA: Diagnosis not present

## 2018-03-24 DIAGNOSIS — M65341 Trigger finger, right ring finger: Secondary | ICD-10-CM | POA: Diagnosis not present

## 2018-04-03 DIAGNOSIS — Z9889 Other specified postprocedural states: Secondary | ICD-10-CM | POA: Diagnosis not present

## 2018-04-24 DIAGNOSIS — Z9889 Other specified postprocedural states: Secondary | ICD-10-CM | POA: Diagnosis not present

## 2018-05-13 DIAGNOSIS — M65341 Trigger finger, right ring finger: Secondary | ICD-10-CM | POA: Diagnosis not present

## 2018-05-13 DIAGNOSIS — S6411XD Injury of median nerve at wrist and hand level of right arm, subsequent encounter: Secondary | ICD-10-CM | POA: Diagnosis not present

## 2018-05-19 DIAGNOSIS — M65341 Trigger finger, right ring finger: Secondary | ICD-10-CM | POA: Diagnosis not present

## 2018-05-19 DIAGNOSIS — S6411XD Injury of median nerve at wrist and hand level of right arm, subsequent encounter: Secondary | ICD-10-CM | POA: Diagnosis not present

## 2018-05-21 DIAGNOSIS — I129 Hypertensive chronic kidney disease with stage 1 through stage 4 chronic kidney disease, or unspecified chronic kidney disease: Secondary | ICD-10-CM | POA: Diagnosis not present

## 2018-05-21 DIAGNOSIS — M810 Age-related osteoporosis without current pathological fracture: Secondary | ICD-10-CM | POA: Diagnosis not present

## 2018-05-21 DIAGNOSIS — E785 Hyperlipidemia, unspecified: Secondary | ICD-10-CM | POA: Diagnosis not present

## 2018-05-25 DIAGNOSIS — M65341 Trigger finger, right ring finger: Secondary | ICD-10-CM | POA: Diagnosis not present

## 2018-05-25 DIAGNOSIS — S6411XD Injury of median nerve at wrist and hand level of right arm, subsequent encounter: Secondary | ICD-10-CM | POA: Diagnosis not present

## 2018-05-26 DIAGNOSIS — S6411XD Injury of median nerve at wrist and hand level of right arm, subsequent encounter: Secondary | ICD-10-CM | POA: Diagnosis not present

## 2018-05-26 DIAGNOSIS — M65341 Trigger finger, right ring finger: Secondary | ICD-10-CM | POA: Diagnosis not present

## 2018-05-28 DIAGNOSIS — M65341 Trigger finger, right ring finger: Secondary | ICD-10-CM | POA: Diagnosis not present

## 2018-05-28 DIAGNOSIS — S6411XD Injury of median nerve at wrist and hand level of right arm, subsequent encounter: Secondary | ICD-10-CM | POA: Diagnosis not present

## 2018-06-02 DIAGNOSIS — S6411XD Injury of median nerve at wrist and hand level of right arm, subsequent encounter: Secondary | ICD-10-CM | POA: Diagnosis not present

## 2018-06-02 DIAGNOSIS — M65341 Trigger finger, right ring finger: Secondary | ICD-10-CM | POA: Diagnosis not present

## 2018-06-04 DIAGNOSIS — S6411XD Injury of median nerve at wrist and hand level of right arm, subsequent encounter: Secondary | ICD-10-CM | POA: Diagnosis not present

## 2018-06-04 DIAGNOSIS — M65341 Trigger finger, right ring finger: Secondary | ICD-10-CM | POA: Diagnosis not present

## 2018-06-09 DIAGNOSIS — S6411XD Injury of median nerve at wrist and hand level of right arm, subsequent encounter: Secondary | ICD-10-CM | POA: Diagnosis not present

## 2018-06-09 DIAGNOSIS — M65341 Trigger finger, right ring finger: Secondary | ICD-10-CM | POA: Diagnosis not present

## 2018-06-10 DIAGNOSIS — Z9889 Other specified postprocedural states: Secondary | ICD-10-CM | POA: Diagnosis not present

## 2018-06-11 DIAGNOSIS — M65341 Trigger finger, right ring finger: Secondary | ICD-10-CM | POA: Diagnosis not present

## 2018-06-11 DIAGNOSIS — S6411XD Injury of median nerve at wrist and hand level of right arm, subsequent encounter: Secondary | ICD-10-CM | POA: Diagnosis not present

## 2018-07-27 DIAGNOSIS — E559 Vitamin D deficiency, unspecified: Secondary | ICD-10-CM | POA: Diagnosis not present

## 2018-07-27 DIAGNOSIS — I10 Essential (primary) hypertension: Secondary | ICD-10-CM | POA: Diagnosis not present

## 2018-07-27 DIAGNOSIS — I129 Hypertensive chronic kidney disease with stage 1 through stage 4 chronic kidney disease, or unspecified chronic kidney disease: Secondary | ICD-10-CM | POA: Diagnosis not present

## 2018-08-03 DIAGNOSIS — Z0000 Encounter for general adult medical examination without abnormal findings: Secondary | ICD-10-CM | POA: Diagnosis not present

## 2018-08-03 DIAGNOSIS — B009 Herpesviral infection, unspecified: Secondary | ICD-10-CM | POA: Diagnosis not present

## 2018-08-03 DIAGNOSIS — I10 Essential (primary) hypertension: Secondary | ICD-10-CM | POA: Diagnosis not present

## 2018-08-03 DIAGNOSIS — M8000XG Age-related osteoporosis with current pathological fracture, unspecified site, subsequent encounter for fracture with delayed healing: Secondary | ICD-10-CM | POA: Diagnosis not present

## 2018-08-03 DIAGNOSIS — E559 Vitamin D deficiency, unspecified: Secondary | ICD-10-CM | POA: Diagnosis not present

## 2018-08-17 DIAGNOSIS — Z23 Encounter for immunization: Secondary | ICD-10-CM | POA: Diagnosis not present

## 2018-09-04 DIAGNOSIS — K219 Gastro-esophageal reflux disease without esophagitis: Secondary | ICD-10-CM | POA: Diagnosis not present

## 2018-09-04 DIAGNOSIS — Z1211 Encounter for screening for malignant neoplasm of colon: Secondary | ICD-10-CM | POA: Diagnosis not present

## 2018-10-02 DIAGNOSIS — K5730 Diverticulosis of large intestine without perforation or abscess without bleeding: Secondary | ICD-10-CM | POA: Diagnosis not present

## 2018-10-02 DIAGNOSIS — K635 Polyp of colon: Secondary | ICD-10-CM | POA: Diagnosis not present

## 2018-10-02 DIAGNOSIS — Z1211 Encounter for screening for malignant neoplasm of colon: Secondary | ICD-10-CM | POA: Diagnosis not present

## 2018-10-06 DIAGNOSIS — K635 Polyp of colon: Secondary | ICD-10-CM | POA: Diagnosis not present

## 2018-10-27 DIAGNOSIS — N952 Postmenopausal atrophic vaginitis: Secondary | ICD-10-CM | POA: Diagnosis not present

## 2018-10-27 DIAGNOSIS — Z6827 Body mass index (BMI) 27.0-27.9, adult: Secondary | ICD-10-CM | POA: Diagnosis not present

## 2018-10-27 DIAGNOSIS — R3915 Urgency of urination: Secondary | ICD-10-CM | POA: Diagnosis not present

## 2018-12-14 DIAGNOSIS — I10 Essential (primary) hypertension: Secondary | ICD-10-CM | POA: Diagnosis not present

## 2018-12-14 DIAGNOSIS — J40 Bronchitis, not specified as acute or chronic: Secondary | ICD-10-CM | POA: Diagnosis not present

## 2019-02-08 DIAGNOSIS — I10 Essential (primary) hypertension: Secondary | ICD-10-CM | POA: Diagnosis not present

## 2019-02-08 DIAGNOSIS — E559 Vitamin D deficiency, unspecified: Secondary | ICD-10-CM | POA: Diagnosis not present

## 2019-02-08 DIAGNOSIS — E041 Nontoxic single thyroid nodule: Secondary | ICD-10-CM | POA: Diagnosis not present

## 2019-06-10 ENCOUNTER — Other Ambulatory Visit: Payer: Self-pay

## 2019-08-04 DIAGNOSIS — Z1231 Encounter for screening mammogram for malignant neoplasm of breast: Secondary | ICD-10-CM | POA: Diagnosis not present

## 2019-08-04 DIAGNOSIS — Z803 Family history of malignant neoplasm of breast: Secondary | ICD-10-CM | POA: Diagnosis not present

## 2019-08-05 DIAGNOSIS — I129 Hypertensive chronic kidney disease with stage 1 through stage 4 chronic kidney disease, or unspecified chronic kidney disease: Secondary | ICD-10-CM | POA: Diagnosis not present

## 2019-08-05 DIAGNOSIS — M810 Age-related osteoporosis without current pathological fracture: Secondary | ICD-10-CM | POA: Diagnosis not present

## 2019-08-05 DIAGNOSIS — E785 Hyperlipidemia, unspecified: Secondary | ICD-10-CM | POA: Diagnosis not present

## 2019-08-05 DIAGNOSIS — R5383 Other fatigue: Secondary | ICD-10-CM | POA: Diagnosis not present

## 2019-08-10 DIAGNOSIS — Z0000 Encounter for general adult medical examination without abnormal findings: Secondary | ICD-10-CM | POA: Diagnosis not present

## 2019-08-10 DIAGNOSIS — I10 Essential (primary) hypertension: Secondary | ICD-10-CM | POA: Diagnosis not present

## 2019-08-10 DIAGNOSIS — E559 Vitamin D deficiency, unspecified: Secondary | ICD-10-CM | POA: Diagnosis not present

## 2019-08-10 DIAGNOSIS — E041 Nontoxic single thyroid nodule: Secondary | ICD-10-CM | POA: Diagnosis not present

## 2019-08-11 DIAGNOSIS — R922 Inconclusive mammogram: Secondary | ICD-10-CM | POA: Diagnosis not present

## 2019-08-11 DIAGNOSIS — R921 Mammographic calcification found on diagnostic imaging of breast: Secondary | ICD-10-CM | POA: Diagnosis not present

## 2019-08-24 DIAGNOSIS — E042 Nontoxic multinodular goiter: Secondary | ICD-10-CM | POA: Diagnosis not present

## 2019-08-24 DIAGNOSIS — Z7189 Other specified counseling: Secondary | ICD-10-CM | POA: Diagnosis not present

## 2019-08-24 DIAGNOSIS — N951 Menopausal and female climacteric states: Secondary | ICD-10-CM | POA: Diagnosis not present

## 2019-08-24 DIAGNOSIS — L659 Nonscarring hair loss, unspecified: Secondary | ICD-10-CM | POA: Diagnosis not present

## 2019-08-24 DIAGNOSIS — Z23 Encounter for immunization: Secondary | ICD-10-CM | POA: Diagnosis not present

## 2019-08-27 ENCOUNTER — Other Ambulatory Visit: Payer: Self-pay | Admitting: Internal Medicine

## 2019-08-27 DIAGNOSIS — E042 Nontoxic multinodular goiter: Secondary | ICD-10-CM

## 2019-09-02 ENCOUNTER — Ambulatory Visit
Admission: RE | Admit: 2019-09-02 | Discharge: 2019-09-02 | Disposition: A | Discharge status: Home / Self Care | Payer: Medicare Other | Source: Ambulatory Visit | Attending: Internal Medicine | Admitting: Internal Medicine

## 2019-09-02 DIAGNOSIS — E042 Nontoxic multinodular goiter: Secondary | ICD-10-CM

## 2019-09-02 DIAGNOSIS — E041 Nontoxic single thyroid nodule: Secondary | ICD-10-CM | POA: Diagnosis not present

## 2019-10-06 DIAGNOSIS — E042 Nontoxic multinodular goiter: Secondary | ICD-10-CM | POA: Diagnosis not present

## 2019-10-06 DIAGNOSIS — N951 Menopausal and female climacteric states: Secondary | ICD-10-CM | POA: Diagnosis not present

## 2019-10-06 DIAGNOSIS — L659 Nonscarring hair loss, unspecified: Secondary | ICD-10-CM | POA: Diagnosis not present

## 2019-10-06 DIAGNOSIS — Z6827 Body mass index (BMI) 27.0-27.9, adult: Secondary | ICD-10-CM | POA: Diagnosis not present

## 2019-10-06 DIAGNOSIS — Z7189 Other specified counseling: Secondary | ICD-10-CM | POA: Diagnosis not present

## 2019-10-20 DIAGNOSIS — M810 Age-related osteoporosis without current pathological fracture: Secondary | ICD-10-CM | POA: Diagnosis not present

## 2019-10-20 DIAGNOSIS — E785 Hyperlipidemia, unspecified: Secondary | ICD-10-CM | POA: Diagnosis not present

## 2019-10-20 DIAGNOSIS — E559 Vitamin D deficiency, unspecified: Secondary | ICD-10-CM | POA: Diagnosis not present

## 2019-10-20 DIAGNOSIS — I10 Essential (primary) hypertension: Secondary | ICD-10-CM | POA: Diagnosis not present

## 2019-12-13 DIAGNOSIS — I10 Essential (primary) hypertension: Secondary | ICD-10-CM | POA: Diagnosis not present

## 2019-12-13 DIAGNOSIS — Z0000 Encounter for general adult medical examination without abnormal findings: Secondary | ICD-10-CM | POA: Diagnosis not present

## 2020-01-11 DIAGNOSIS — R921 Mammographic calcification found on diagnostic imaging of breast: Secondary | ICD-10-CM | POA: Diagnosis not present

## 2020-06-28 DIAGNOSIS — H9311 Tinnitus, right ear: Secondary | ICD-10-CM | POA: Diagnosis not present

## 2020-07-13 DIAGNOSIS — R921 Mammographic calcification found on diagnostic imaging of breast: Secondary | ICD-10-CM | POA: Diagnosis not present

## 2020-07-25 DIAGNOSIS — I10 Essential (primary) hypertension: Secondary | ICD-10-CM | POA: Diagnosis not present

## 2020-07-25 DIAGNOSIS — E559 Vitamin D deficiency, unspecified: Secondary | ICD-10-CM | POA: Diagnosis not present

## 2020-07-25 DIAGNOSIS — E785 Hyperlipidemia, unspecified: Secondary | ICD-10-CM | POA: Diagnosis not present

## 2020-07-25 DIAGNOSIS — E7800 Pure hypercholesterolemia, unspecified: Secondary | ICD-10-CM | POA: Diagnosis not present

## 2020-08-10 DIAGNOSIS — I10 Essential (primary) hypertension: Secondary | ICD-10-CM | POA: Diagnosis not present

## 2020-08-10 DIAGNOSIS — E785 Hyperlipidemia, unspecified: Secondary | ICD-10-CM | POA: Diagnosis not present

## 2020-08-10 DIAGNOSIS — E559 Vitamin D deficiency, unspecified: Secondary | ICD-10-CM | POA: Diagnosis not present

## 2020-08-10 DIAGNOSIS — Z23 Encounter for immunization: Secondary | ICD-10-CM | POA: Diagnosis not present

## 2020-08-16 ENCOUNTER — Other Ambulatory Visit: Payer: Self-pay | Admitting: Radiology

## 2020-08-16 DIAGNOSIS — N6092 Unspecified benign mammary dysplasia of left breast: Secondary | ICD-10-CM | POA: Diagnosis not present

## 2020-08-16 DIAGNOSIS — R921 Mammographic calcification found on diagnostic imaging of breast: Secondary | ICD-10-CM | POA: Diagnosis not present

## 2020-08-17 DIAGNOSIS — H9311 Tinnitus, right ear: Secondary | ICD-10-CM | POA: Diagnosis not present

## 2020-08-17 DIAGNOSIS — H903 Sensorineural hearing loss, bilateral: Secondary | ICD-10-CM | POA: Diagnosis not present

## 2020-08-22 DIAGNOSIS — Z23 Encounter for immunization: Secondary | ICD-10-CM | POA: Diagnosis not present

## 2020-09-14 DIAGNOSIS — N6092 Unspecified benign mammary dysplasia of left breast: Secondary | ICD-10-CM | POA: Diagnosis not present

## 2020-09-26 DIAGNOSIS — Z20822 Contact with and (suspected) exposure to covid-19: Secondary | ICD-10-CM | POA: Diagnosis not present

## 2020-10-06 DIAGNOSIS — Z23 Encounter for immunization: Secondary | ICD-10-CM | POA: Diagnosis not present

## 2020-10-12 DIAGNOSIS — E042 Nontoxic multinodular goiter: Secondary | ICD-10-CM | POA: Diagnosis not present

## 2020-10-12 DIAGNOSIS — Z6825 Body mass index (BMI) 25.0-25.9, adult: Secondary | ICD-10-CM | POA: Diagnosis not present

## 2020-10-12 DIAGNOSIS — N951 Menopausal and female climacteric states: Secondary | ICD-10-CM | POA: Diagnosis not present

## 2020-10-12 DIAGNOSIS — L659 Nonscarring hair loss, unspecified: Secondary | ICD-10-CM | POA: Diagnosis not present

## 2020-12-03 DIAGNOSIS — Z20822 Contact with and (suspected) exposure to covid-19: Secondary | ICD-10-CM | POA: Diagnosis not present

## 2020-12-04 DIAGNOSIS — Z23 Encounter for immunization: Secondary | ICD-10-CM | POA: Diagnosis not present

## 2020-12-13 DIAGNOSIS — E7800 Pure hypercholesterolemia, unspecified: Secondary | ICD-10-CM | POA: Diagnosis not present

## 2020-12-13 DIAGNOSIS — E785 Hyperlipidemia, unspecified: Secondary | ICD-10-CM | POA: Diagnosis not present

## 2020-12-13 DIAGNOSIS — M1990 Unspecified osteoarthritis, unspecified site: Secondary | ICD-10-CM | POA: Diagnosis not present

## 2020-12-13 DIAGNOSIS — I129 Hypertensive chronic kidney disease with stage 1 through stage 4 chronic kidney disease, or unspecified chronic kidney disease: Secondary | ICD-10-CM | POA: Diagnosis not present

## 2020-12-13 DIAGNOSIS — M810 Age-related osteoporosis without current pathological fracture: Secondary | ICD-10-CM | POA: Diagnosis not present

## 2020-12-13 DIAGNOSIS — D709 Neutropenia, unspecified: Secondary | ICD-10-CM | POA: Diagnosis not present

## 2020-12-13 DIAGNOSIS — Z0000 Encounter for general adult medical examination without abnormal findings: Secondary | ICD-10-CM | POA: Diagnosis not present

## 2020-12-13 DIAGNOSIS — I10 Essential (primary) hypertension: Secondary | ICD-10-CM | POA: Diagnosis not present

## 2020-12-13 DIAGNOSIS — E559 Vitamin D deficiency, unspecified: Secondary | ICD-10-CM | POA: Diagnosis not present

## 2020-12-18 DIAGNOSIS — Z0000 Encounter for general adult medical examination without abnormal findings: Secondary | ICD-10-CM | POA: Diagnosis not present

## 2020-12-18 DIAGNOSIS — I10 Essential (primary) hypertension: Secondary | ICD-10-CM | POA: Diagnosis not present

## 2020-12-18 DIAGNOSIS — M810 Age-related osteoporosis without current pathological fracture: Secondary | ICD-10-CM | POA: Diagnosis not present

## 2020-12-18 DIAGNOSIS — E042 Nontoxic multinodular goiter: Secondary | ICD-10-CM | POA: Diagnosis not present

## 2020-12-18 DIAGNOSIS — E559 Vitamin D deficiency, unspecified: Secondary | ICD-10-CM | POA: Diagnosis not present

## 2021-03-13 DIAGNOSIS — R922 Inconclusive mammogram: Secondary | ICD-10-CM | POA: Diagnosis not present

## 2021-03-20 ENCOUNTER — Other Ambulatory Visit: Payer: Self-pay | Admitting: Radiology

## 2021-03-20 DIAGNOSIS — D0511 Intraductal carcinoma in situ of right breast: Secondary | ICD-10-CM | POA: Diagnosis not present

## 2021-03-20 DIAGNOSIS — R921 Mammographic calcification found on diagnostic imaging of breast: Secondary | ICD-10-CM | POA: Diagnosis not present

## 2021-03-20 DIAGNOSIS — N644 Mastodynia: Secondary | ICD-10-CM | POA: Diagnosis not present

## 2021-03-30 DIAGNOSIS — D0511 Intraductal carcinoma in situ of right breast: Secondary | ICD-10-CM | POA: Diagnosis not present

## 2021-04-04 ENCOUNTER — Other Ambulatory Visit: Payer: Self-pay | Admitting: General Surgery

## 2021-04-04 DIAGNOSIS — D0511 Intraductal carcinoma in situ of right breast: Secondary | ICD-10-CM

## 2021-04-05 DIAGNOSIS — L659 Nonscarring hair loss, unspecified: Secondary | ICD-10-CM | POA: Diagnosis not present

## 2021-04-06 NOTE — Progress Notes (Signed)
Surgical Instructions    Your procedure is scheduled on June 6  Report to [facility] Main Entrance "A" at 1300 P.M., then check in with the Admitting office.  Call this number if you have problems the morning of surgery:  [phone]   If you have any questions prior to your surgery date call [phone]: Open Monday-Friday 8am-4pm    Remember:  Do not eat after midnight the night before your surgery  You may drink clear liquids until 1200 pm the afternoon of your surgery.   Clear liquids allowed are: Water, Non-Citrus Juices (without pulp), Carbonated Beverages, Clear Tea, Black Coffee Only, and Gatorade  Please complete your PRE-SURGERY ENSURE that was provided to you by 1200 Pm the afternoon of surgery.  Please, if able, drink it in one setting. DO NOT SIP.     Take these medicines the morning of surgery with A SIP OF WATER  acetaminophen (TYLENOL) if needed amLODipine (NORVASC)  clonazePAM (KLONOPIN) Eye drops if needed valACYclovir (VALTREX) if needed   Follow your surgeon's instructions on when to stop Aspirin.  If no instructions were given by your surgeon then you will need to call the office to get those instructions.    As of today, STOP taking any Aleve, Naproxen, Ibuprofen, Motrin, Advil, Goody's, BC's, all herbal medications, fish oil, and all vitamins.                     Do not wear jewelry, make up, or nail polish DO Not wear nail polish, gel polish, artificial nails, or any other type of covering on natural nails including finger and toenails. If patients have artificial nails, gel coating, etc. that need to be removed by a nail saloon please have this removed prior to surgery or surgery may need to be canceled/delayed if the surgeon/ anesthesia feels like the patient is unable to be adequately monitored.            Do not wear lotions, powders, perfumes/colognes, or deodorant.            Do not shave 48 hours prior to surgery.  Men may shave face and neck.             Do not bring valuables to the hospital.            [facility] is not responsible for any belongings or valuables.  Do NOT Smoke (Tobacco/Vaping) or drink Alcohol 24 hours prior to your procedure If you use a CPAP at night, you may bring all equipment for your overnight stay.   Contacts, glasses, dentures or bridgework may not be worn into surgery, please bring cases for these belongings   For patients admitted to the hospital, discharge time will be determined by your treatment team.   Patients discharged the day of surgery will not be allowed to drive home, and someone needs to stay with them for 24 hours.    Special instructions:    Oral Hygiene is also important to reduce your risk of infection.  Remember - BRUSH YOUR TEETH THE MORNING OF SURGERY WITH YOUR REGULAR TOOTHPASTE   [facility]- Preparing For Surgery  Before surgery, you can play an important role. Because skin is not sterile, your skin needs to be as free of germs as possible. You can reduce the number of germs on your skin by washing with CHG (chlorahexidine gluconate) Soap before surgery.  CHG is an antiseptic cleaner which kills germs and bonds with the skin  to continue killing germs even after washing.     Please do not use if you have an allergy to CHG or antibacterial soaps. If your skin becomes reddened/irritated stop using the CHG.  Do not shave (including legs and underarms) for at least 48 hours prior to first CHG shower. It is OK to shave your face.  Please follow these instructions carefully.    1.  Shower the NIGHT BEFORE SURGERY and the MORNING OF SURGERY with CHG Soap.   If you chose to wash your hair, wash your hair first as usual with your normal shampoo. After you shampoo, rinse your hair and body thoroughly to remove the shampoo.  Then [employer] and genitals (private parts) with your normal soap and rinse thoroughly to remove soap.  2. After that Use CHG Soap as you would any other liquid  soap. You can apply CHG directly to the skin and wash gently with a scrungie or a clean washcloth.   3. Apply the CHG Soap to your body ONLY FROM THE NECK DOWN.  Do not use on open wounds or open sores. Avoid contact with your eyes, ears, mouth and genitals (private parts). Wash Face and genitals (private parts)  with your normal soap.   4. Wash thoroughly, paying special attention to the area where your surgery will be performed.  5. Thoroughly rinse your body with warm water from the neck down.  6. DO NOT shower/wash with your normal soap after using and rinsing off the CHG Soap.  7. Pat yourself dry with a CLEAN TOWEL.  8. Wear CLEAN PAJAMAS to bed the night before surgery  9. Place CLEAN SHEETS on your bed the night before your surgery  10. DO NOT SLEEP WITH PETS.   Day of Surgery:  Take a shower with CHG soap. Wear Clean/Comfortable clothing the morning of surgery Do not apply any deodorants/lotions.   Remember to brush your teeth WITH YOUR REGULAR TOOTHPASTE.   Please read over the following fact sheets that you were given.

## 2021-04-10 ENCOUNTER — Encounter (HOSPITAL_COMMUNITY): Payer: Self-pay

## 2021-04-10 ENCOUNTER — Encounter (HOSPITAL_COMMUNITY)
Admission: RE | Admit: 2021-04-10 | Discharge: 2021-04-10 | Disposition: A | Discharge status: Home / Self Care | Payer: Medicare Other | Source: Ambulatory Visit | Attending: General Surgery | Admitting: General Surgery

## 2021-04-10 ENCOUNTER — Other Ambulatory Visit: Payer: Self-pay

## 2021-04-10 DIAGNOSIS — Z01818 Encounter for other preprocedural examination: Secondary | ICD-10-CM | POA: Insufficient documentation

## 2021-04-10 HISTORY — DX: Cardiac murmur, unspecified: R01.1

## 2021-04-10 HISTORY — DX: Unspecified osteoarthritis, unspecified site: M19.90

## 2021-04-10 HISTORY — DX: Headache, unspecified: R51.9

## 2021-04-10 LAB — CBC
HCT: 40.8 % (ref 36.0–46.0)
Hemoglobin: 13.8 g/dL (ref 12.0–15.0)
MCH: 30.8 pg (ref 26.0–34.0)
MCHC: 33.8 g/dL (ref 30.0–36.0)
MCV: 91.1 fL (ref 80.0–100.0)
Platelets: 211 10*3/uL (ref 150–400)
RBC: 4.48 MIL/uL (ref 3.87–5.11)
RDW: 12.4 % (ref 11.5–15.5)
WBC: 3.7 10*3/uL — ABNORMAL LOW (ref 4.0–10.5)
nRBC: 0 % (ref 0.0–0.2)

## 2021-04-10 LAB — BASIC METABOLIC PANEL
Anion gap: 8 (ref 5–15)
BUN: 14 mg/dL (ref 8–23)
CO2: 28 mmol/L (ref 22–32)
Calcium: 9.6 mg/dL (ref 8.9–10.3)
Chloride: 102 mmol/L (ref 98–111)
Creatinine, Ser: 0.64 mg/dL (ref 0.44–1.00)
GFR, Estimated: >60 mL/min (ref >60)
Glucose, Bld: 98 mg/dL (ref 70–99)
Potassium: 3.7 mmol/L (ref 3.5–5.1)
Sodium: 138 mmol/L (ref 135–145)

## 2021-04-10 NOTE — Progress Notes (Signed)
PCP - Earlyne Iba prevous Cardiologist - denies Endocrinolgist: Mahvani Averneni at [facility] OB/GYn: Dr. Alesia Richards  PPM/ICD - denies   Chest x-ray - n/a EKG - 04/10/21 Stress Test - 2013 ECHO - denies Cardiac Cath - denies  Sleep Study - denies   No diabetes  Follow your surgeon's instructions on when to stop Aspirin.  If no instructions were given by your surgeon then you will need to call the office to get those instructions.    As of today, STOP taking any Aleve, Naproxen, Ibuprofen, Motrin, Advil, Goody's, BC's, all herbal medications, fish oil, and all vitamins.  ERAS Protcol -yes PRE-SURGERY Ensure or G2- ensure and instructions given  COVID TEST- scheduled for 04/13/21   Anesthesia review: no  Patient denies shortness of breath, fever, cough and chest pain at PAT appointment   All instructions explained to the patient, with a verbal understanding of the material. Patient agrees to go over the instructions while at home for a better understanding. Patient also instructed to self quarantine after being tested for COVID-19. The opportunity to ask questions was provided.

## 2021-04-12 DIAGNOSIS — C50919 Malignant neoplasm of unspecified site of unspecified female breast: Secondary | ICD-10-CM | POA: Diagnosis not present

## 2021-04-12 DIAGNOSIS — D0511 Intraductal carcinoma in situ of right breast: Secondary | ICD-10-CM | POA: Diagnosis not present

## 2021-04-12 DIAGNOSIS — E042 Nontoxic multinodular goiter: Secondary | ICD-10-CM | POA: Diagnosis not present

## 2021-04-12 DIAGNOSIS — L659 Nonscarring hair loss, unspecified: Secondary | ICD-10-CM | POA: Diagnosis not present

## 2021-04-12 DIAGNOSIS — Z6825 Body mass index (BMI) 25.0-25.9, adult: Secondary | ICD-10-CM | POA: Diagnosis not present

## 2021-04-12 DIAGNOSIS — R490 Dysphonia: Secondary | ICD-10-CM | POA: Diagnosis not present

## 2021-04-13 ENCOUNTER — Other Ambulatory Visit (HOSPITAL_COMMUNITY): Payer: PRIVATE HEALTH INSURANCE

## 2021-04-16 ENCOUNTER — Ambulatory Visit (HOSPITAL_COMMUNITY)
Admission: RE | Admit: 2021-04-16 | Discharge: 2021-04-16 | Disposition: A | Discharge status: Home / Self Care | Payer: Medicare Other | Attending: General Surgery | Admitting: General Surgery

## 2021-04-16 ENCOUNTER — Ambulatory Visit (HOSPITAL_COMMUNITY): Payer: Medicare Other | Admitting: Anesthesiology

## 2021-04-16 ENCOUNTER — Encounter (HOSPITAL_COMMUNITY)
Admission: RE | Disposition: A | Discharge status: Home / Self Care | Payer: Self-pay | Source: Home / Self Care | Attending: General Surgery

## 2021-04-16 ENCOUNTER — Encounter (HOSPITAL_COMMUNITY): Payer: Self-pay | Admitting: General Surgery

## 2021-04-16 DIAGNOSIS — N6489 Other specified disorders of breast: Secondary | ICD-10-CM | POA: Diagnosis not present

## 2021-04-16 DIAGNOSIS — Z8249 Family history of ischemic heart disease and other diseases of the circulatory system: Secondary | ICD-10-CM | POA: Diagnosis not present

## 2021-04-16 DIAGNOSIS — D0511 Intraductal carcinoma in situ of right breast: Secondary | ICD-10-CM | POA: Insufficient documentation

## 2021-04-16 DIAGNOSIS — R202 Paresthesia of skin: Secondary | ICD-10-CM | POA: Diagnosis not present

## 2021-04-16 DIAGNOSIS — Z888 Allergy status to other drugs, medicaments and biological substances status: Secondary | ICD-10-CM | POA: Diagnosis not present

## 2021-04-16 DIAGNOSIS — C50919 Malignant neoplasm of unspecified site of unspecified female breast: Secondary | ICD-10-CM | POA: Diagnosis present

## 2021-04-16 DIAGNOSIS — I10 Essential (primary) hypertension: Secondary | ICD-10-CM | POA: Diagnosis not present

## 2021-04-16 DIAGNOSIS — Z170 Estrogen receptor positive status [ER+]: Secondary | ICD-10-CM | POA: Diagnosis not present

## 2021-04-16 DIAGNOSIS — C50911 Malignant neoplasm of unspecified site of right female breast: Secondary | ICD-10-CM | POA: Diagnosis not present

## 2021-04-16 DIAGNOSIS — M1990 Unspecified osteoarthritis, unspecified site: Secondary | ICD-10-CM | POA: Diagnosis not present

## 2021-04-16 HISTORY — PX: BREAST LUMPECTOMY WITH RADIOACTIVE SEED LOCALIZATION: SHX6424

## 2021-04-16 SURGERY — BREAST LUMPECTOMY WITH RADIOACTIVE SEED LOCALIZATION
Anesthesia: General | Site: Breast | Laterality: Right

## 2021-04-16 MED ORDER — CHLORHEXIDINE GLUCONATE 0.12 % MT SOLN
OROMUCOSAL | Status: AC
  Administered 2021-04-16: 15 mL via OROMUCOSAL
  Filled 2021-04-16: qty 15

## 2021-04-16 MED ORDER — SODIUM CHLORIDE 0.9% FLUSH: 3.0000 mL | INTRAVENOUS | Status: DC | PRN

## 2021-04-16 MED ORDER — AMISULPRIDE (ANTIEMETIC) 5 MG/2ML IV SOLN: 10.0000 mg | Freq: Once | INTRAVENOUS | Status: DC | PRN

## 2021-04-16 MED ORDER — SODIUM CHLORIDE 0.9% FLUSH: 3.0000 mL | Freq: Two times a day (BID) | INTRAVENOUS | Status: DC

## 2021-04-16 MED ORDER — OXYCODONE HCL 5 MG PO TABS
ORAL_TABLET | ORAL | Status: AC
  Administered 2021-04-16: 5 mg via ORAL
  Filled 2021-04-16: qty 1

## 2021-04-16 MED ORDER — FENTANYL CITRATE (PF) 100 MCG/2ML IJ SOLN
INTRAMUSCULAR | Status: DC | PRN
  Administered 2021-04-16 (×2): 50 ug via INTRAVENOUS

## 2021-04-16 MED ORDER — ONDANSETRON HCL 4 MG/2ML IJ SOLN
INTRAMUSCULAR | Status: DC | PRN
  Administered 2021-04-16: 4 mg via INTRAVENOUS

## 2021-04-16 MED ORDER — CEFAZOLIN SODIUM-DEXTROSE 2-4 GM/100ML-% IV SOLN
2.0000 g | INTRAVENOUS | Status: AC
  Administered 2021-04-16: 2 g via INTRAVENOUS

## 2021-04-16 MED ORDER — ACETAMINOPHEN 650 MG RE SUPP: 650.0000 mg | RECTAL | Status: DC | PRN

## 2021-04-16 MED ORDER — DEXAMETHASONE SODIUM PHOSPHATE 10 MG/ML IJ SOLN
INTRAMUSCULAR | Status: DC | PRN
  Administered 2021-04-16: 5 mg via INTRAVENOUS

## 2021-04-16 MED ORDER — BUPIVACAINE HCL (PF) 0.25 % IJ SOLN
INTRAMUSCULAR | Status: AC
  Filled 2021-04-16: qty 30

## 2021-04-16 MED ORDER — LIDOCAINE HCL (CARDIAC) PF 100 MG/5ML IV SOSY
PREFILLED_SYRINGE | INTRAVENOUS | Status: DC | PRN
  Administered 2021-04-16: 60 mg via INTRAVENOUS

## 2021-04-16 MED ORDER — ONDANSETRON HCL 4 MG/2ML IJ SOLN
INTRAMUSCULAR | Status: AC
  Filled 2021-04-16: qty 2

## 2021-04-16 MED ORDER — LACTATED RINGERS IV SOLN: INTRAVENOUS | Status: DC

## 2021-04-16 MED ORDER — FENTANYL CITRATE (PF) 100 MCG/2ML IJ SOLN: 25.0000 ug | INTRAMUSCULAR | Status: DC | PRN

## 2021-04-16 MED ORDER — SODIUM CHLORIDE 0.9 % IV SOLN: 250.0000 mL | INTRAVENOUS | Status: DC | PRN

## 2021-04-16 MED ORDER — EPHEDRINE SULFATE-NACL 50-0.9 MG/10ML-% IV SOSY
PREFILLED_SYRINGE | INTRAVENOUS | Status: DC | PRN
  Administered 2021-04-16 (×3): 10 mg via INTRAVENOUS

## 2021-04-16 MED ORDER — FENTANYL CITRATE (PF) 250 MCG/5ML IJ SOLN
INTRAMUSCULAR | Status: AC
  Filled 2021-04-16: qty 5

## 2021-04-16 MED ORDER — PROPOFOL 10 MG/ML IV BOLUS
INTRAVENOUS | Status: DC | PRN
  Administered 2021-04-16: 150 mg via INTRAVENOUS

## 2021-04-16 MED ORDER — PHENYLEPHRINE 40 MCG/ML (10ML) SYRINGE FOR IV PUSH (FOR BLOOD PRESSURE SUPPORT)
PREFILLED_SYRINGE | INTRAVENOUS | Status: AC
  Filled 2021-04-16: qty 10

## 2021-04-16 MED ORDER — OXYCODONE HCL 5 MG PO TABS: 5.0000 mg | ORAL_TABLET | ORAL | Status: DC | PRN

## 2021-04-16 MED ORDER — BUPIVACAINE HCL (PF) 0.25 % IJ SOLN
INTRAMUSCULAR | Status: DC | PRN
  Administered 2021-04-16: 10 mL

## 2021-04-16 MED ORDER — OXYCODONE HCL 5 MG/5ML PO SOLN: 5.0000 mg | Freq: Once | ORAL | Status: AC | PRN

## 2021-04-16 MED ORDER — ONDANSETRON HCL 4 MG/2ML IJ SOLN: 4.0000 mg | Freq: Once | INTRAMUSCULAR | Status: DC | PRN

## 2021-04-16 MED ORDER — OXYCODONE HCL 5 MG PO TABS: 5.0000 mg | ORAL_TABLET | Freq: Once | ORAL | Status: AC | PRN

## 2021-04-16 MED ORDER — EPHEDRINE 5 MG/ML INJ
INTRAVENOUS | Status: AC
  Filled 2021-04-16: qty 10

## 2021-04-16 MED ORDER — MIDAZOLAM HCL 5 MG/5ML IJ SOLN
INTRAMUSCULAR | Status: DC | PRN
  Administered 2021-04-16: 1 mg via INTRAVENOUS

## 2021-04-16 MED ORDER — CHLORHEXIDINE GLUCONATE 0.12 % MT SOLN: 15.0000 mL | Freq: Once | OROMUCOSAL | Status: AC

## 2021-04-16 MED ORDER — 0.9 % SODIUM CHLORIDE (POUR BTL) OPTIME
TOPICAL | Status: DC | PRN
  Administered 2021-04-16: 1000 mL

## 2021-04-16 MED ORDER — SODIUM CHLORIDE 0.9 % IV SOLN: INTRAVENOUS | Status: DC

## 2021-04-16 MED ORDER — FENTANYL CITRATE (PF) 100 MCG/2ML IJ SOLN
INTRAMUSCULAR | Status: AC
  Administered 2021-04-16: 25 ug via INTRAVENOUS
  Filled 2021-04-16: qty 2

## 2021-04-16 MED ORDER — PROPOFOL 10 MG/ML IV BOLUS
INTRAVENOUS | Status: AC
  Filled 2021-04-16: qty 20

## 2021-04-16 MED ORDER — ACETAMINOPHEN 500 MG PO TABS: 1000.0000 mg | ORAL_TABLET | ORAL | Status: DC

## 2021-04-16 MED ORDER — MIDAZOLAM HCL 2 MG/2ML IJ SOLN
INTRAMUSCULAR | Status: AC
  Filled 2021-04-16: qty 2

## 2021-04-16 MED ORDER — CEFAZOLIN SODIUM-DEXTROSE 2-4 GM/100ML-% IV SOLN
INTRAVENOUS | Status: AC
  Filled 2021-04-16: qty 100

## 2021-04-16 MED ORDER — ORAL CARE MOUTH RINSE: 15.0000 mL | Freq: Once | OROMUCOSAL | Status: AC

## 2021-04-16 MED ORDER — ENSURE PRE-SURGERY PO LIQD: 296.0000 mL | Freq: Once | ORAL | Status: DC

## 2021-04-16 MED ORDER — ACETAMINOPHEN 325 MG PO TABS: 650.0000 mg | ORAL_TABLET | ORAL | Status: DC | PRN

## 2021-04-16 MED ORDER — ACETAMINOPHEN 500 MG PO TABS
ORAL_TABLET | ORAL | Status: AC
  Filled 2021-04-16: qty 2

## 2021-04-16 MED ORDER — LIDOCAINE 2% (20 MG/ML) 5 ML SYRINGE
INTRAMUSCULAR | Status: AC
  Filled 2021-04-16: qty 10

## 2021-04-16 SURGICAL SUPPLY — 41 items
APPLIER CLIP 9.375 MED OPEN (MISCELLANEOUS)
BINDER BREAST LRG (GAUZE/BANDAGES/DRESSINGS) ×2 IMPLANT
BINDER BREAST XLRG (GAUZE/BANDAGES/DRESSINGS) IMPLANT
CANISTER SUCT 3000ML PPV (MISCELLANEOUS) ×2 IMPLANT
CHLORAPREP W/TINT 26 (MISCELLANEOUS) ×2 IMPLANT
CLIP APPLIE 9.375 MED OPEN (MISCELLANEOUS) IMPLANT
CLIP VESOCCLUDE MED 6/CT (CLIP) ×2 IMPLANT
CLSR STERI-STRIP ANTIMIC 1/2X4 (GAUZE/BANDAGES/DRESSINGS) ×2 IMPLANT
COVER PROBE W GEL 5X96 (DRAPES) ×2 IMPLANT
COVER SURGICAL LIGHT HANDLE (MISCELLANEOUS) ×2 IMPLANT
COVER WAND RF STERILE (DRAPES) ×2 IMPLANT
DERMABOND ADVANCED (GAUZE/BANDAGES/DRESSINGS) ×1
DERMABOND ADVANCED .7 DNX12 (GAUZE/BANDAGES/DRESSINGS) ×1 IMPLANT
DEVICE DUBIN SPECIMEN MAMMOGRA (MISCELLANEOUS) ×2 IMPLANT
DRAPE CHEST BREAST 15X10 FENES (DRAPES) ×2 IMPLANT
ELECT COATED BLADE 2.86 ST (ELECTRODE) ×2 IMPLANT
ELECT REM PT RETURN 9FT ADLT (ELECTROSURGICAL) ×2
ELECTRODE REM PT RTRN 9FT ADLT (ELECTROSURGICAL) ×1 IMPLANT
GLOVE BIO SURGEON STRL SZ7 (GLOVE) ×4 IMPLANT
GLOVE BIOGEL PI IND STRL 7.5 (GLOVE) ×1 IMPLANT
GLOVE BIOGEL PI INDICATOR 7.5 (GLOVE) ×1
GOWN STRL REUS W/ TWL LRG LVL3 (GOWN DISPOSABLE) ×2 IMPLANT
GOWN STRL REUS W/TWL LRG LVL3 (GOWN DISPOSABLE) ×2
KIT BASIN OR (CUSTOM PROCEDURE TRAY) ×2 IMPLANT
KIT MARKER MARGIN INK (KITS) ×2 IMPLANT
LIGHT WAVEGUIDE WIDE FLAT (MISCELLANEOUS) IMPLANT
NEEDLE HYPO 25GX1X1/2 BEV (NEEDLE) ×2 IMPLANT
NS IRRIG 1000ML POUR BTL (IV SOLUTION) ×2 IMPLANT
PACK GENERAL/GYN (CUSTOM PROCEDURE TRAY) ×2 IMPLANT
RETRACTOR ONETRAX LX 90X20 (MISCELLANEOUS) ×2 IMPLANT
STRIP CLOSURE SKIN 1/2X4 (GAUZE/BANDAGES/DRESSINGS) ×2 IMPLANT
SUT MNCRL AB 4-0 PS2 18 (SUTURE) ×2 IMPLANT
SUT MON AB 5-0 PS2 18 (SUTURE) IMPLANT
SUT SILK 2 0 SH (SUTURE) IMPLANT
SUT VIC AB 2-0 SH 27 (SUTURE) ×1
SUT VIC AB 2-0 SH 27XBRD (SUTURE) ×1 IMPLANT
SUT VIC AB 3-0 SH 27 (SUTURE) ×1
SUT VIC AB 3-0 SH 27X BRD (SUTURE) ×1 IMPLANT
SYR CONTROL 10ML LL (SYRINGE) ×2 IMPLANT
TOWEL GREEN STERILE (TOWEL DISPOSABLE) ×2 IMPLANT
TOWEL GREEN STERILE FF (TOWEL DISPOSABLE) ×2 IMPLANT

## 2021-04-16 NOTE — Anesthesia Procedure Notes (Signed)
Procedure Name: LMA Insertion Date/Time: 04/16/2021 1:11 PM Performed by: Ignacia Bayley, CRNA Pre-anesthesia Checklist: Patient identified, Patient being monitored, Emergency Drugs available, Timeout performed and Suction available Patient Re-evaluated:Patient Re-evaluated prior to induction Oxygen Delivery Method: Circle System Utilized Preoxygenation: Pre-oxygenation with 100% oxygen Induction Type: IV induction Ventilation: Mask ventilation without difficulty LMA: LMA inserted LMA Size: 4.0 Number of attempts: 1 Placement Confirmation: positive ETCO2 and breath sounds checked- equal and bilateral

## 2021-04-16 NOTE — Interval H&P Note (Signed)
History and Physical Interval Note:  04/16/2021 12:30 PM  Tracy Horne  has presented today for surgery, with the diagnosis of RIGHT BREAST DCIS.  The various methods of treatment have been discussed with the patient and family. After consideration of risks, benefits and other options for treatment, the patient has consented to  Procedure(s): RIGHT BREAST LUMPECTOMY WITH RADIOACTIVE SEED LOCALIZATION (Right) as a surgical intervention.  The patient's history has been reviewed, patient examined, no change in status, stable for surgery.  I have reviewed the patient's chart and labs.  Questions were answered to the patient's satisfaction.     Rolm Bookbinder

## 2021-04-16 NOTE — H&P (Signed)
Tracy Horne is an 75 y.o. female.   Chief Complaint: dcis HPI:  57 yof with two prior benign breast surgeries for a cyst and for nipple dc with fh in gm elderly with bca. she has no mass or dc. she presents after mammogram showing c density breasts that was done for six month follow up of right sided calcs. the calcs increased in number in september and she underwent core biopsy. 9 gauge biopsy done with apparent removal of all calcs. the path was [facility]. she then underwent another follow up mm with 1.6 cm right sided calcs that were increased from prior. 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 she is here to discuss options   Past Medical History:  Diagnosis Date  . Arthritis    shoulders and lower back per patient  . Headache   . Heart murmur   . Hypertension     Past Surgical History:  Procedure Laterality Date  . BREAST EXCISIONAL BIOPSY Left   . CARPAL TUNNEL RELEASE Right   . PILONIDAL CYST EXCISION    . TONSILLECTOMY     removed as a child    Family History  Problem Relation Age of Onset  . Heart attack Mother   . Heart failure Father   . High blood pressure Brother   . Breast cancer Neg Hx    Social History:  reports that she is a non-smoker but has been exposed to tobacco smoke. She has never used smokeless tobacco. She reports that she does not drink alcohol and does not use drugs.  Allergies:  Allergies  Allergen Reactions  . Felodipine     Anxiety     No medications prior to admission.    No results found for this or any previous visit (from the past 48 hour(s)). No results found.  Review of Systems  All other systems reviewed and are negative.   There were no vitals taken for this visit. Physical Exam  General Mental Status-Alert. Orientation-Oriented X3. Breast Nipples-No Discharge. Breast Lump-No Palpable Breast Mass. Lymphatic Head & Neck General Head & Neck Lymphatics: Bilateral - Description -  Normal. Axillary General Axillary Region: Bilateral - Description - Normal. Note: no [place] adenopathy  Assessment/Plan BREAST NEOPLASM, TIS (DCIS), RIGHT (D05.11) Story: Right breast seed guided lumpectomy We discussed the staging and pathophysiology of breast cancer. We discussed all of the different options for treatment for breast cancer including surgery, chemotherapy, radiation therapy, Herceptin, and antiestrogen therapy. We discussed noninvasive nature of this lesion. She does not need sentinel node biopsy. We discussed the options for treatment of the breast cancer which included lumpectomy versus a mastectomy. We discussed the performance of the lumpectomy with radioactive seed placement. We discussed a 5-10% chance of a positive margin requiring reexcision in the operating room. We also discussed that she might need radiation therapy if she undergoes lumpectomy. We discussed mastectomy and the postoperative care for that as well. Mastectomy can be followed by reconstruction. We discussed that there is no difference in her survival whether she undergoes lumpectomy with radiation therapy or antiestrogen therapy versus a mastectomy. There is also no real difference between her recurrence in the breast. We discussed the risks of operation including bleeding, infection, possible reoperation. She understands her further therapy will be based on what her stages at the time of her operation.   Rolm Bookbinder, MD 04/16/2021, 6:30 AM

## 2021-04-16 NOTE — Discharge Instructions (Addendum)
[facility] Office Phone Number [phone]  BREAST BIOPSY/ PARTIAL MASTECTOMY: POST OP INSTRUCTIONS Take 400 mg of ibuprofen every 8 hours or 650 mg tylenol every 6 hours for next 72 hours then as needed. Use ice several times daily also. Always review your discharge instruction sheet given to you by the facility where your surgery was performed.  IF YOU HAVE DISABILITY OR FAMILY LEAVE FORMS, YOU MUST BRING THEM TO THE OFFICE FOR PROCESSING.  DO NOT GIVE THEM TO YOUR DOCTOR.  1. A prescription for pain medication may be given to you upon discharge.  Take your pain medication as prescribed, if needed.  If narcotic pain medicine is not needed, then you may take acetaminophen (Tylenol), naprosyn (Alleve) or ibuprofen (Advil) as needed. 2. Take your usually prescribed medications unless otherwise directed 3. If you need a refill on your pain medication, please contact your pharmacy.  They will contact our office to request authorization.  Prescriptions will not be filled after 5pm or on week-ends. 4. You should eat very light the first 24 hours after surgery, such as soup, crackers, pudding, etc.  Resume your normal diet the day after surgery. 5. Most patients will experience some swelling and bruising in the breast.  Ice packs and a good support bra will help.  Wear the breast binder provided or a sports bra for 72 hours day and night.  After that wear a sports bra during the day until you return to the office. Swelling and bruising can take several days to resolve.  6. It is common to experience some constipation if taking pain medication after surgery.  Increasing fluid intake and taking a stool softener will usually help or prevent this problem from occurring.  A mild laxative (Milk of Magnesia or Miralax) should be taken according to package directions if there are no bowel movements after 48 hours. 7. Unless discharge instructions indicate otherwise, you may remove your bandages 48  hours after surgery and you may shower at that time.  You may have steri-strips (small skin tapes) in place directly over the incision.  These strips should be left on the skin for 7-10 days and will come off on their own.  If your surgeon used skin glue on the incision, you may shower in 24 hours.  The glue will flake off over the next 2-3 weeks.  Any sutures or staples will be removed at the office during your follow-up visit. 8. ACTIVITIES:  You may resume regular daily activities (gradually increasing) beginning the next day.  Wearing a good support bra or sports bra minimizes pain and swelling.  You may have sexual intercourse when it is comfortable. a. You may drive when you no longer are taking prescription pain medication, you can comfortably wear a seatbelt, and you can safely maneuver your car and apply brakes. b. RETURN TO WORK:  ______________________________________________________________________________________ 9. You should see your doctor in the office for a follow-up appointment approximately two weeks after your surgery.  Your doctor's nurse will typically make your follow-up appointment when she calls you with your pathology report.  Expect your pathology report 3-4 business days after your surgery.  You may call to check if you do not hear from us after three days. 10. OTHER INSTRUCTIONS: _______________________________________________________________________________________________ _____________________________________________________________________________________________________________________________________ _____________________________________________________________________________________________________________________________________ _____________________________________________________________________________________________________________________________________  WHEN TO CALL DR WAKEFIELD: 1. Fever over 101.0 2. Nausea and/or vomiting. 3. Extreme swelling or  bruising. 4. Continued bleeding from incision. 5. Increased pain, redness, or drainage from the incision.  The clinic   staff is available to answer your questions during regular business hours.  Please don't hesitate to call and ask to speak to one of the nurses for clinical concerns.  If you have a medical emergency, go to the nearest emergency room or call 911.  A surgeon from [facility] is always on call at the hospital.  For further questions, please visit [email or web address]  

## 2021-04-16 NOTE — Anesthesia Preprocedure Evaluation (Signed)
Anesthesia Evaluation  Patient identified by MRN, date of birth, ID band Patient awake    Reviewed: Allergy & Precautions, NPO status , Patient's Chart, lab work & pertinent test results  History of Anesthesia Complications Negative for: history of anesthetic complications  Airway Mallampati: II  TM Distance: >3 FB Neck ROM: Full    Dental  (+) Dental Advisory Given, Teeth Intact   Pulmonary neg pulmonary ROS,    Pulmonary exam normal        Cardiovascular hypertension, Pt. on medications Normal cardiovascular exam     Neuro/Psych negative neurological ROS     GI/Hepatic negative GI ROS, Neg liver ROS,   Endo/Other  negative endocrine ROS  Renal/GU negative Renal ROS  negative genitourinary   Musculoskeletal  (+) Arthritis ,   Abdominal   Peds  Hematology negative hematology ROS (+)   Anesthesia Other Findings  R breast DCIS  Reproductive/Obstetrics                             Anesthesia Physical Anesthesia Plan  ASA: II  Anesthesia Plan: General   Post-op Pain Management:    Induction: Intravenous  PONV Risk Score and Plan: 3 and Ondansetron, Dexamethasone, Midazolam and Treatment may vary due to age or medical condition  Airway Management Planned: LMA  Additional Equipment: None  Intra-op Plan:   Post-operative Plan: Extubation in OR  Informed Consent: I have reviewed the patients History and Physical, chart, labs and discussed the procedure including the risks, benefits and alternatives for the proposed anesthesia with the patient or authorized representative who has indicated his/her understanding and acceptance.     Dental advisory given  Plan Discussed with:   Anesthesia Plan Comments:         Anesthesia Quick Evaluation

## 2021-04-16 NOTE — Transfer of Care (Signed)
Immediate Anesthesia Transfer of Care Note  Patient: Tracy Horne  Procedure(s) Performed: RIGHT BREAST LUMPECTOMY WITH RADIOACTIVE SEED LOCALIZATION (Right Breast)  Patient Location: PACU  Anesthesia Type:General  Level of Consciousness: awake  Airway & Oxygen Therapy: Patient Spontanous Breathing and Patient connected to nasal cannula oxygen  Post-op Assessment: Report given to RN and Post -op Vital signs reviewed and stable  Post vital signs: Reviewed and stable  Last Vitals:  Vitals Value Taken Time  BP 123/70 04/16/21 1356  Temp    Pulse 65 04/16/21 1358  Resp 4 04/16/21 1358  SpO2 100 % 04/16/21 1358  Vitals shown include unvalidated device data.  Last Pain:  Vitals:   04/16/21 1138  TempSrc:   PainSc: 3       Patients Stated Pain Goal: 3 (17/47/15 9539)  Complications: No complications documented.

## 2021-04-16 NOTE — Anesthesia Postprocedure Evaluation (Signed)
Anesthesia Post Note  Patient: Tracy Horne  Procedure(s) Performed: RIGHT BREAST LUMPECTOMY WITH RADIOACTIVE SEED LOCALIZATION (Right Breast)     Patient location during evaluation: PACU Anesthesia Type: General Level of consciousness: awake and alert Pain management: pain level controlled Vital Signs Assessment: post-procedure vital signs reviewed and stable Respiratory status: spontaneous breathing, nonlabored ventilation and respiratory function stable Cardiovascular status: blood pressure returned to baseline and stable Postop Assessment: no apparent nausea or vomiting Anesthetic complications: no   No complications documented.  Last Vitals:  Vitals:   04/16/21 1440 04/16/21 1450  BP: 123/81   Pulse: 61 64  Resp: (!) 21 11  Temp:  (!) 36.4 C  SpO2: 100% 100%    Last Pain:  Vitals:   04/16/21 1450  TempSrc:   PainSc: 7                  Ralene Gasparyan E Suleima Ohlendorf

## 2021-04-16 NOTE — Op Note (Signed)
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 Serita Grammes Estimated blood loss: Minimal Anesthesia: General Specimens:right breast tissue containing seeds and clips marked with paint Complications: None Drains: None Sponge needle count was correct completion Disposition recovery stable addition  Indications: 16 yof with two prior benign breast surgeries for a cyst and for nipple dc with fh in gm elderly with bca. she presents after mammogram showing c density breasts that was done for six month follow up of right sided calcs. the calcs increased in number in september and she underwent core biopsy. 9 gauge biopsy done with apparent removal of all calcs. the path was [facility]. she then underwent another follow up mm with 1.6 cm right sided calcs that were increased from prior. 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 We discussed lumpectomy.   Procedure: She first had seeds placed.  I had these mammograms available in the operating room.  After informed consent was obtained she was then taken to the operating room.  Antibiotics were administered.  SCDs were in place.  She was placed under general anesthesia.  She was prepped and draped in the standard sterile surgical fashion.  A surgical timeout was then performed.  I infiltrated Marcaine around the area where the seeds were.  This was on the central breast.  I made a periareolar incision in order to hide the scar later.  I dissected down to the seeds.  I then remove the seeds and some of the surrounding tissue with an attempt to get a clear margin.  Mammogram confirmed removal of the seeds and the clips.   I then obtained hemostasis.  I closed the breast tissue after mobilizing it with 2-0 Vicryl.  The skin was closed with 3-0 Vicryl for Monocryl.  Glue and Steri-Strips were applied.  A binder was placed.  She  tolerated this well was extubated and transferred to recovery stable.

## 2021-04-17 ENCOUNTER — Other Ambulatory Visit: Payer: Self-pay | Admitting: Internal Medicine

## 2021-04-17 ENCOUNTER — Encounter (HOSPITAL_COMMUNITY): Payer: Self-pay | Admitting: General Surgery

## 2021-04-17 DIAGNOSIS — R490 Dysphonia: Secondary | ICD-10-CM

## 2021-04-17 DIAGNOSIS — E042 Nontoxic multinodular goiter: Secondary | ICD-10-CM

## 2021-04-23 ENCOUNTER — Ambulatory Visit
Admission: RE | Admit: 2021-04-23 | Discharge: 2021-04-23 | Disposition: A | Discharge status: Home / Self Care | Payer: Medicare Other | Source: Ambulatory Visit | Attending: Internal Medicine | Admitting: Internal Medicine

## 2021-04-23 DIAGNOSIS — E041 Nontoxic single thyroid nodule: Secondary | ICD-10-CM | POA: Diagnosis not present

## 2021-04-23 DIAGNOSIS — R490 Dysphonia: Secondary | ICD-10-CM

## 2021-04-23 DIAGNOSIS — E042 Nontoxic multinodular goiter: Secondary | ICD-10-CM

## 2021-04-23 LAB — SURGICAL PATHOLOGY

## 2021-04-27 ENCOUNTER — Telehealth: Payer: Self-pay | Admitting: Hematology

## 2021-04-27 NOTE — Telephone Encounter (Signed)
Scheduled appt. 6/16 referral. Pt aware.

## 2021-05-02 NOTE — Progress Notes (Addendum)
[place]   [phone] Fax:([phone]) [place] Note   Patient Care Team: Deland Pretty, MD as PCP - General (Internal Medicine) Valinda Party, MD (Rheumatology) Jani Gravel, MD as Consulting Physician (Internal Medicine) Rockwell Germany, RN as Oncology Nurse Navigator Mauro Kaufmann, RN as Oncology Nurse Navigator  Date of Service:  05/07/2021   CHIEF COMPLAINTS/PURPOSE OF CONSULTATION:  Right Invasive breast cancer  REFERRING PHYSICIAN:  Dr Donne Hazel   Oncology History Overview Note  Cancer Staging Malignant neoplasm of lower-outer quadrant of right breast of female, estrogen receptor positive ([facility]) Staging form: Breast, AJCC 8th Edition - Pathologic stage from 04/16/2021: Stage Unknown (pT1a, pNX, G1, ER+, PR-, HER2+) - Signed by Truitt Merle, MD on 05/06/2021 Stage prefix: Initial diagnosis Multigene prognostic tests performed: None Histologic grading system: 3 grade system Residual tumor (R): R0 - None    Malignant neoplasm of lower-outer quadrant of right breast of female, estrogen receptor positive ([place])  08/16/2020 Pathology Results   Diagnosis Breast, right, needle core biopsy - LOBULAR NEOPLASIA (ATYPICAL LOBULAR HYPERPLASIA) WITH MICROCALCIFICATIONS - SEE COMMENT Microscopic Comment These results were called to [facility] on August 17, 2020.   03/13/2021 Mammogram   Right mammogram 03/13/21 There are pleomorphic calcifications spanning 1.6cm in the slightly linear configuration within the right lower outer breast anterior to middle depth 2.7cm from the nipple. These are increased in number. No other significant masses or calcifications.    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 - LOBULAR NEOPLASIA (ATYPICAL LOBULAR HYPERPLASIA). Microscopic  Comment The carcinoma appears high grade. E-cadherin is pending and will be reported in an addendum. Dr. Saralyn Pilar has reviewed the case. The case was called to Dr. Luan Pulling on 03/21/2021.  E-cadherin is positive in the areas of in situ carcinoma and thus, these areas are consistent with high grade ductal carcinoma in situ. E-cadherin is negative in areas of atypical lobular hyperplasia.   04/16/2021 Cancer Staging   Staging form: Breast, AJCC 8th Edition - Pathologic stage from 04/16/2021: Stage Unknown (pT1a, pNX, G1, ER+, PR-, HER2+) - Signed by Truitt Merle, MD on 05/06/2021  Stage prefix: Initial diagnosis  Multigene prognostic tests performed: None  Histologic grading system: 3 grade system  Residual tumor (R): R0 - None    04/16/2021 Surgery   RIGHT BREAST LUMPECTOMY WITH RADIOACTIVE SEED LOCALIZATION by Dr Donne Hazel    FINAL MICROSCOPIC DIAGNOSIS:   A. 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   All controls stained appropriately.    05/06/2021 Initial Diagnosis   Malignant neoplasm  of lower-outer quadrant of right breast of female, estrogen receptor positive ([place])       HISTORY OF PRESENTING ILLNESS:  Tracy Horne 75 y.o. female is a here because of invasive right breast cancer. The patient was referred by Dr Donne Hazel. The  patient presents to the clinic today accompanied by her son.  She was found to have a right breast mass on her late 2021 Mammogram. Biopsy in 10/2020 showed [place]. Repeat mammogram in 03/2021 showed growth of mass. 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 She underwent lumpectomy on 04/16/21 which showed early stage invasive breast cancer. Her surgeon referred her to me. She notes she was seen by PCP 04/10/21.   She has a PMHx of bursitis in b/l shoulders. She has benign right thyroid nodule which has been aspirated before. She continues to monitor. She also has HTN and heart murmur. Most surgery was MSK related. I reviewed her medication list with her. Her MGM had breast cancer in her 67s. She notes her menopause was moderately symptomatic. She required D&C in the past. Socially she is widowed with 1 adult son. She lives alone currently. She is a retired Equities trader. She is originally from [place] and grew up in [place]. She moved to [place] with husband before he passed. Her son is stationed in [place]: 10 LMP: 29 G1P1    REVIEW OF SYSTEMS:    Constitutional: Denies fevers, chills or abnormal night sweats Eyes: Denies blurriness of vision, double vision or watery eyes Ears, nose, mouth, throat, and face: Denies mucositis or sore throat Respiratory: Denies cough, dyspnea or wheezes Cardiovascular: Denies palpitation, chest discomfort or lower extremity swelling Gastrointestinal:  Denies nausea, heartburn or change in bowel habits Skin: Denies abnormal skin rashes MSK: (+) Bursitis of b/l shoulders  Lymphatics: Denies new lymphadenopathy or easy bruising Neurological:Denies numbness, tingling or new weaknesses Behavioral/Psych: Mood is stable, no new changes  All other systems were reviewed with the patient and are negative.   MEDICAL HISTORY:  Past Medical History:  Diagnosis Date   Arthritis    shoulders and lower back per patient   Headache    Heart  murmur    Hypertension    Thyroid disease     SURGICAL HISTORY: Past Surgical History:  Procedure Laterality Date   BREAST EXCISIONAL BIOPSY Left    BREAST LUMPECTOMY WITH RADIOACTIVE SEED LOCALIZATION Right 04/16/2021   Procedure: RIGHT BREAST LUMPECTOMY WITH RADIOACTIVE SEED LOCALIZATION;  Surgeon: Rolm Bookbinder, MD;  Location: [facility];  Service: General;  Laterality: Right;   CARPAL TUNNEL RELEASE Right    PILONIDAL CYST EXCISION     THYROID CYST EXCISION Right    TONSILLECTOMY     removed as a child    SOCIAL HISTORY: Social History   Socioeconomic History   Marital status: Widowed    Spouse name: Not on file   Number of children: 1   Years of education: Not on file   Highest education level: Not on file  Occupational History   Occupation: Retired  Tobacco Use   Smoking status: Never    Passive exposure: Yes   Smokeless tobacco: Never  Vaping Use   Vaping Use: Never used  Substance and Sexual Activity   Alcohol use: No   Drug use: No   Sexual activity: Not on file  Other Topics Concern   Not on file  Social History Narrative   Lives at home w/ her family   Right-handed  Caffeine: 2 cups of coffee per day, occasional sweet tea   Social Determinants of Health   Financial Resource Strain: Not on file  Food Insecurity: Not on file  Transportation Needs: Not on file  Physical Activity: Not on file  Stress: Not on file  Social Connections: Not on file  Intimate Partner Violence: Not on file    FAMILY HISTORY: Family History  Problem Relation Age of Onset   Heart attack Mother    Heart failure Father    High blood pressure Brother    Cancer Maternal Grandmother 68       breast cancer   Breast cancer Maternal Grandmother     ALLERGIES:  is allergic to felodipine and vitamin d.  MEDICATIONS:  Current Outpatient Medications  Medication Sig Dispense Refill   acetaminophen (TYLENOL) 325 MG tablet Take 650 mg by mouth every 6 (six) hours as needed  for moderate pain.     amLODipine (NORVASC) 2.5 MG tablet Take 2.5 mg by mouth daily.     aspirin EC 81 MG tablet Take 81 mg by mouth every other day. Swallow whole.     calcium carbonate (TUMS - DOSED IN MG ELEMENTAL CALCIUM) 500 MG chewable tablet Chew 1 tablet by mouth daily as needed for indigestion or heartburn.     cholecalciferol (VITAMIN D3) 25 MCG (1000 UNIT) tablet Take 1,000 Units by mouth daily.     clonazePAM (KLONOPIN) 0.5 MG tablet Take 0.5 mg by mouth 2 (two) times daily as needed for anxiety.     ketoconazole (NIZORAL) 2 % shampoo 5 ml     Multiple Vitamins-Minerals (HAIR FORMULA EXTRA STRENGTH PO) Take 1 tablet by mouth daily.     Omega-3 1000 MG CAPS Take 1,000 mg by mouth daily.     OVER THE COUNTER MEDICATION Apply 1 application topically at bedtime as needed (pain). Magnesium Cream     Propylene Glycol (SYSTANE BALANCE) 0.6 % SOLN Place 1 drop into both eyes daily as needed (dry eyes).     telmisartan-hydrochlorothiazide (MICARDIS HCT) 80-12.5 MG tablet Take 1 tablet by mouth daily.     valACYclovir (VALTREX) 500 MG tablet TAKE 2 TABLETS(1000 MG) BY MOUTH TWICE DAILY (Patient taking differently: Take 500 mg by mouth 2 (two) times daily as needed (outbreaks).) 20 tablet 0   No current facility-administered medications for this visit.    PHYSICAL EXAMINATION: ECOG PERFORMANCE STATUS: 0 - Asymptomatic  Vitals:   05/07/21 1522  BP: (!) 137/98  Pulse: 83  Resp: 18  Temp: 97.9 F (36.6 C)  SpO2: 100%   Filed Weights   05/07/21 1522  Weight: 139 lb 4.8 oz (63.2 kg)    Due to COVID19 we will limit examination to appearance. Patient had no complaints.  GENERAL:alert, no distress and comfortable SKIN: skin color normal, no rashes or significant lesions EYES: normal, Conjunctiva are pink and non-injected, sclera clear  NEURO: alert & oriented x 3 with fluent speech BREAST EXAM DEFERRED TODAY   LABORATORY DATA:  I have reviewed the data as listed CBC Latest Ref  Rng & Units 04/10/2021 10/29/2010  WBC 4.0 - 10.5 K/uL 3.7(L) 3.7(L)  Hemoglobin 12.0 - 15.0 g/dL 13.8 13.9  Hematocrit 36.0 - 46.0 % 40.8 40.4  Platelets 150 - 400 K/uL 211 199    CMP Latest Ref Rng & Units 04/10/2021 10/29/2010  Glucose 70 - 99 mg/dL 98 94  BUN 8 - 23 mg/dL 14 11  Creatinine 0.44 - 1.00 mg/dL 0.64 0.69  Sodium  135 - 145 mmol/L 138 145  Potassium 3.5 - 5.1 mmol/L 3.7 4.5  Chloride 98 - 111 mmol/L 102 106  CO2 22 - 32 mmol/L 28 30  Calcium 8.9 - 10.3 mg/dL 9.6 9.8     RADIOGRAPHIC STUDIES: I have personally reviewed the radiological images as listed and agreed with the findings in the report. US THYROID  Result Date: 04/23/2021 CLINICAL DATA:  Prior ultrasound follow-up. 75 year old female with multiple thyroid nodules. Right-sided thyroid nodule previously biopsied 06/11/2017 with a diagnosis of benign follicular nodule. EXAM: THYROID ULTRASOUND TECHNIQUE: Ultrasound examination of the thyroid gland and adjacent soft tissues was performed. COMPARISON:  Prior thyroid ultrasound 09/02/2019 FINDINGS: Parenchymal Echotexture: Normal Isthmus: 0.5 cm Right lobe: 4.6 x 2.7 by 2.7 cm Left lobe: 3.8 x 1.7 x 1.4 cm _________________________________________________________ Estimated total number of nodules >/= 1 cm: 2 Number of spongiform nodules >/=  2 cm not described below (TR1): 0 Number of mixed cystic and solid nodules >/= 1.5 cm not described below (TR2): 0 _________________________________________________________ Nodule # 1: The mass occupying the central aspect of the right gland which was previously biopsied in 2018 demonstrates no significant interval change in size or appearance. The lesion measures 3.8 x 2.9 x 2.3 cm compared to 3.8 x 2.5 x 2.1 cm previously. Nodule # 2: Stable left upper pole cyst at 2.0 x 1.7 x 0.9 cm. This is considered sonographically benign. No further follow-up required. Additional tiny subcentimeter nodules noted incidentally in the inferior glands  bilaterally. None meet criteria to recommend further evaluation. IMPRESSION: Continued stability of previously biopsied thyroid nodule. No new or suspicious thyroid nodules identified. The above is in keeping with the ACR TI-RADS recommendations - J Am Coll Radiol 2017;14:587-595. Electronically Signed   By: Jacqulynn Cadet M.D.   On: 04/23/2021 13:27    ASSESSMENT & PLAN:  Tracy Horne is a 75 y.o. [place] female with a history of Arthritis, Heart murmur, HTN    1. Malignant neoplasm of lower-outer quadrant of right breast, Stage I, p(T1aN0M0), ER+/PR-/HER2+, Grade I  -We discussed her image findings and the biopsy results in great details. She was seen to have [facility] on 08/16/20 right breast biopsy. There was progression on 03/13/21 mammogram. 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  -She underwent right lumpectomy on 04/16/21 with Dr Donne Hazel. Surgical pathology showed a 0.15cm grade I invasive ductal carcinoma and components of high grade DCIS, ER/HER2 positive and ER negative.  -I discussed features of HER2 positive disease is higher risk for breast cancer recurrence. However, given her very small 0.15cm invasive tumor, ER positive disease and her age, and her willingness to take antiestrogen therapy, I think the benefit of adjuvant chemotherapy and HER2 antibody is minimal, and I do not recommend adjuvant chemotherapy/trastuzumab. She is agreeable.  -She was also seen by radiation oncologist PA Worthy Flank today. Adjuvant radiation is recommended after lumpectomy to reduce her risk of local recurrence.   -Given the strong ER expression in her postmenopausal status, I recommend adjuvant endocrine therapy with aromatase inhibitor Anastrozole for a total of 5 years to reduce the risk of cancer recurrence.  --The potential benefit and side effects, which includes but not limited to, hot flash, skin and vaginal dryness, metabolic changes ( increased blood glucose,  cholesterol, weight, etc.), slightly in increased risk of cardiovascular disease, cataracts, muscular and joint discomfort, osteopenia and osteoporosis, etc, were discussed with her in great details. She is interested, and we'll start after she  completes radiation. -I recommend anastrozole. I gave her print out of medication. Plan to start 2-4 weeks after Radiation.  -We also discussed the breast cancer surveillance after her surgery. She will continue annual screening mammogram, self exams, and a routine office visit with lab and exam with [place]. I discussed the option of additional screening with annual breast MRIs.  -Reviewed her Labs from 04/10/21 which showed CBC and CMP WNL except WBC 3.7. Patient notes is chronic, mild and stable for her.  -She will call me when she is done with Radiation to start her on anastrozole.      2. Bone Health  -Per patient she had DEXA with PCP office, which showed osteopenia and was done 3-4 years ago.  -I discussed anastrozole can weaken her bone. I discussed repeating DEXA soon for new baseline.    PLAN:  -I do not recommend adjuvant chemo and or trastuzumab  -proceed with Radiation, possibly in [place] which is local  -she will call me after she completes radiation, I will call in anastrozole then and schedule her f/u with [place]    No orders of the defined types were placed in this encounter.   All questions were answered. The patient knows to call the clinic with any problems, questions or concerns. The total time spent in the appointment was 45 minutes.     Truitt Merle, MD 05/07/2021 5:23 PM  I, Joslyn Devon, am acting as scribe for Truitt Merle, MD.   I have reviewed the above documentation for accuracy and completeness, and I agree with the above.

## 2021-05-06 DIAGNOSIS — Z170 Estrogen receptor positive status [ER+]: Secondary | ICD-10-CM | POA: Insufficient documentation

## 2021-05-06 DIAGNOSIS — C50511 Malignant neoplasm of lower-outer quadrant of right female breast: Secondary | ICD-10-CM | POA: Insufficient documentation

## 2021-05-07 ENCOUNTER — Ambulatory Visit
Admission: RE | Admit: 2021-05-07 | Discharge: 2021-05-07 | Disposition: A | Discharge status: Home / Self Care | Payer: Self-pay | Source: Ambulatory Visit | Attending: Radiation Oncology | Admitting: Radiation Oncology

## 2021-05-07 ENCOUNTER — Ambulatory Visit
Admission: RE | Admit: 2021-05-07 | Discharge: 2021-05-07 | Disposition: A | Discharge status: Home / Self Care | Payer: Medicare Other | Source: Ambulatory Visit | Attending: Radiation Oncology | Admitting: Radiation Oncology

## 2021-05-07 ENCOUNTER — Encounter: Payer: Self-pay | Admitting: Radiation Oncology

## 2021-05-07 ENCOUNTER — Other Ambulatory Visit: Payer: Self-pay

## 2021-05-07 ENCOUNTER — Encounter: Payer: Self-pay | Admitting: *Deleted

## 2021-05-07 ENCOUNTER — Telehealth: Payer: Self-pay | Admitting: *Deleted

## 2021-05-07 ENCOUNTER — Encounter: Payer: Self-pay | Admitting: Hematology

## 2021-05-07 ENCOUNTER — Inpatient Hospital Stay: Payer: Medicare Other | Attending: Hematology | Admitting: Hematology

## 2021-05-07 ENCOUNTER — Other Ambulatory Visit: Payer: Self-pay | Admitting: Radiation Oncology

## 2021-05-07 VITALS — BP 120/94 | HR 88 | Temp 97.7°F | Resp 20 | Ht 61.0 in | Wt 139.4 lb

## 2021-05-07 DIAGNOSIS — C50511 Malignant neoplasm of lower-outer quadrant of right female breast: Secondary | ICD-10-CM

## 2021-05-07 DIAGNOSIS — Z170 Estrogen receptor positive status [ER+]: Secondary | ICD-10-CM | POA: Diagnosis not present

## 2021-05-07 DIAGNOSIS — E042 Nontoxic multinodular goiter: Secondary | ICD-10-CM | POA: Insufficient documentation

## 2021-05-07 DIAGNOSIS — Z8249 Family history of ischemic heart disease and other diseases of the circulatory system: Secondary | ICD-10-CM | POA: Diagnosis not present

## 2021-05-07 DIAGNOSIS — Z803 Family history of malignant neoplasm of breast: Secondary | ICD-10-CM | POA: Insufficient documentation

## 2021-05-07 DIAGNOSIS — M8580 Other specified disorders of bone density and structure, unspecified site: Secondary | ICD-10-CM | POA: Diagnosis not present

## 2021-05-07 DIAGNOSIS — Z79899 Other long term (current) drug therapy: Secondary | ICD-10-CM

## 2021-05-07 DIAGNOSIS — I10 Essential (primary) hypertension: Secondary | ICD-10-CM | POA: Diagnosis not present

## 2021-05-07 NOTE — Progress Notes (Signed)
New Breast Cancer Diagnosis: Right Breast  04/16/2021      Family History of Breast/Ovarian/Prostate Cancer: Maternal Grandmother had Breast cancer  Lymphedema issues, if any:  No  Pain issues, if any:  Has occasional shooting pain and tenderness.  SAFETY ISSUES: Prior radiation? No Pacemaker/ICD? No Possible current pregnancy? Postmenopausal Is the patient on methotrexate? No  Current Complaints / other details:

## 2021-05-07 NOTE — Telephone Encounter (Signed)
Called and spoke with patient regarding navigation resources and appt for xrt.  Explained that there was some confusion regarding scheduling her appt.  Explained to her that she could received her xrt in [place] is so chooses due to commute because she lives in [place].  She would still like to meet with the rad onc here 1st today since her son will be with her. Bryson Ha NP has agreed to see her at today before her appt with Dr. Burr Medico.  Instructed patient to be at the cancer center at 130 today. Patient verbalized understanding.  Gave contact information and informed her to call should she have any questions or concerns.

## 2021-05-07 NOTE — Progress Notes (Signed)
Radiation Oncology         [phone] ________________________________  Name: Tracy Horne        MRN: [id number]  Date of Service: 05/07/2021 DOB: [birth year]  LK:HVFMB, Thayer Jew, MD  Rolm Bookbinder, MD     REFERRING PHYSICIAN: Rolm Bookbinder, MD   DIAGNOSIS: The encounter diagnosis was Malignant neoplasm of lower-outer quadrant of right breast of female, estrogen receptor positive ([place]).   HISTORY OF PRESENT ILLNESS: Tracy Horne is a 75 y.o. female seen at the request of Dr. Donne Hazel for a new diagnosis of right breast cancer. The patient originally presented for diagnostic mammogram to follow-up on previously noted right-sided breast calcifications seen 6 months prior.  They had increased in number and a core biopsy revealed atypical lobular hyperplasia in October 2021.  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  She met with Dr. Donne Hazel who recommended lumpectomy and she underwent this procedure on 04/16/2021 which revealed a grade 1 invasive ductal carcinoma measuring up to 1.5 mm with associated high-grade DCIS, calcifications were associated with carcinoma and her margins were less than 1 mm to the lateral margin for invasive disease and 1.5 mm to the anterior and medial margins. Her tumor was ER positive, PR negative, Her2 amplified. She's seen today to discuss treatment recommendations of her cancer. Of note she states that she's supposed to have an ultrasound of her left breast to evaluate a palpable cyst.   PREVIOUS RADIATION THERAPY: No   PAST MEDICAL HISTORY:  Past Medical History:  Diagnosis Date   Arthritis    shoulders and lower back per patient   Headache    Heart murmur    Hypertension        PAST SURGICAL HISTORY: Past Surgical History:  Procedure Laterality Date   BREAST EXCISIONAL  BIOPSY Left    BREAST LUMPECTOMY WITH RADIOACTIVE SEED LOCALIZATION Right 04/16/2021   Procedure: RIGHT BREAST LUMPECTOMY WITH RADIOACTIVE SEED LOCALIZATION;  Surgeon: Rolm Bookbinder, MD;  Location: [facility];  Service: General;  Laterality: Right;   CARPAL TUNNEL RELEASE Right    PILONIDAL CYST EXCISION     THYROID CYST EXCISION Right    TONSILLECTOMY     removed as a child     FAMILY HISTORY:  Family History  Problem Relation Age of Onset   Heart attack Mother    Heart failure Father    High blood pressure Brother    Breast cancer Neg Hx      SOCIAL HISTORY:  reports that she is a non-smoker but has been exposed to tobacco smoke. She has never used smokeless tobacco. She reports that she does not drink alcohol and does not use drugs. The patient lives in [place] and is accompanied by her son.   ALLERGIES: Felodipine and Vitamin d   MEDICATIONS:  Current Outpatient Medications  Medication Sig Dispense Refill   acetaminophen (TYLENOL) 325 MG tablet Take 650 mg by mouth every 6 (six) hours as needed for moderate pain.     amLODipine (NORVASC) 2.5 MG tablet Take 2.5 mg by mouth daily.     aspirin EC 81 MG tablet Take 81 mg by mouth every other day. Swallow whole.     calcium carbonate (TUMS - DOSED IN MG ELEMENTAL CALCIUM) 500 MG chewable tablet Chew 1 tablet by mouth daily as needed for indigestion or heartburn.  cholecalciferol (VITAMIN D3) 25 MCG (1000 UNIT) tablet Take 1,000 Units by mouth daily.     clonazePAM (KLONOPIN) 0.5 MG tablet Take 0.5 mg by mouth 2 (two) times daily as needed for anxiety.     Multiple Vitamins-Minerals (HAIR FORMULA EXTRA STRENGTH PO) Take 1 tablet by mouth daily.     Omega-3 1000 MG CAPS Take 1,000 mg by mouth daily.     OVER THE COUNTER MEDICATION Apply 1 application topically at bedtime as needed (pain). Magnesium Cream     Propylene Glycol (SYSTANE BALANCE) 0.6 % SOLN Place 1 drop into both eyes daily as needed (dry eyes).      telmisartan-hydrochlorothiazide (MICARDIS HCT) 80-12.5 MG tablet Take 1 tablet by mouth daily.     valACYclovir (VALTREX) 500 MG tablet TAKE 2 TABLETS(1000 MG) BY MOUTH TWICE DAILY (Patient taking differently: Take 500 mg by mouth 2 (two) times daily as needed (outbreaks).) 20 tablet 0   ketoconazole (NIZORAL) 2 % shampoo 5 ml     No current facility-administered medications for this encounter.     REVIEW OF SYSTEMS: On review of systems, the patient reports that she is doing well overall. She feels that she is healing nicely. She's been wearing a sports style bra that has helped her feel comfortable. She notes a small nodule in her finger. No other complaints are noted.     PHYSICAL EXAM:  Wt Readings from Last 3 Encounters:  05/07/21 139 lb 6.4 oz (63.2 kg)  04/16/21 139 lb 8 oz (63.3 kg)  04/10/21 138 lb 9.6 oz (62.9 kg)   Temp Readings from Last 3 Encounters:  05/07/21 97.7 F (36.5 C)  04/16/21 (!) 97.5 F (36.4 C)  04/10/21 (!) 96.9 F (36.1 C) (Oral)   BP Readings from Last 3 Encounters:  05/07/21 (!) 120/94  04/16/21 123/81  04/10/21 119/89   Pulse Readings from Last 3 Encounters:  05/07/21 88  04/16/21 64  04/10/21 89    In general this is a well appearing female in no acute distress. She's alert and oriented x4 and appropriate throughout the examination. Cardiopulmonary assessment is negative for acute distress and she exhibits normal effort. Her left breast has a palpable mass/cystic change in the 7:00 position that is mobile and not fixed. The right lumpectomy incision site is well healed without erythema. There is a mobile nodule deep to the skin of the mip joint in the right index finger. No skin disruption is noted.    ECOG = 1  0 - Asymptomatic (Fully active, able to carry on all predisease activities without restriction)  1 - Symptomatic but completely ambulatory (Restricted in physically strenuous activity but ambulatory and able to carry out work of a  light or sedentary nature. For example, light housework, office work)  2 - Symptomatic, <50% in bed during the day (Ambulatory and capable of all self care but unable to carry out any work activities. Up and about more than 50% of waking hours)  3 - Symptomatic, >50% in bed, but not bedbound (Capable of only limited self-care, confined to bed or chair 50% or more of waking hours)  4 - Bedbound (Completely disabled. Cannot carry on any self-care. Totally confined to bed or chair)  5 - Death   Eustace Pen MM, Creech RH, Tormey DC, et al. [id number]). "Toxicity and response criteria of the [facility]". [place] Oncol. 5 (6): 649-55    LABORATORY DATA:  Lab Results  Component Value Date   WBC  3.7 (L) 04/10/2021   HGB 13.8 04/10/2021   HCT 40.8 04/10/2021   MCV 91.1 04/10/2021   PLT 211 04/10/2021   Lab Results  Component Value Date   NA 138 04/10/2021   K 3.7 04/10/2021   CL 102 04/10/2021   CO2 28 04/10/2021   No results found for: ALT, AST, GGT, ALKPHOS, BILITOT    RADIOGRAPHY: US THYROID  Result Date: 04/23/2021 CLINICAL DATA:  Prior ultrasound follow-up. 75 year old female with multiple thyroid nodules. Right-sided thyroid nodule previously biopsied 06/11/2017 with a diagnosis of benign follicular nodule. EXAM: THYROID ULTRASOUND TECHNIQUE: Ultrasound examination of the thyroid gland and adjacent soft tissues was performed. COMPARISON:  Prior thyroid ultrasound 09/02/2019 FINDINGS: Parenchymal Echotexture: Normal Isthmus: 0.5 cm Right lobe: 4.6 x 2.7 by 2.7 cm Left lobe: 3.8 x 1.7 x 1.4 cm _________________________________________________________ Estimated total number of nodules >/= 1 cm: 2 Number of spongiform nodules >/=  2 cm not described below (TR1): 0 Number of mixed cystic and solid nodules >/= 1.5 cm not described below (TR2): 0 _________________________________________________________ Nodule # 1: The mass occupying the central aspect of the right gland  which was previously biopsied in 2018 demonstrates no significant interval change in size or appearance. The lesion measures 3.8 x 2.9 x 2.3 cm compared to 3.8 x 2.5 x 2.1 cm previously. Nodule # 2: Stable left upper pole cyst at 2.0 x 1.7 x 0.9 cm. This is considered sonographically benign. No further follow-up required. Additional tiny subcentimeter nodules noted incidentally in the inferior glands bilaterally. None meet criteria to recommend further evaluation. IMPRESSION: Continued stability of previously biopsied thyroid nodule. No new or suspicious thyroid nodules identified. The above is in keeping with the ACR TI-RADS recommendations - J Am Coll Radiol 2017;14:587-595. Electronically Signed   By: Jacqulynn Cadet M.D.   On: 04/23/2021 13:27       IMPRESSION/PLAN: 1. 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 The patient's course and pathology results were evaluated and we discussed the nature of early stage right breast cancer. The patient is healing well and will see Dr. Burr Medico this afternoon as well. The HER2 component of her diagnosis will determine next steps. Today we discussed the role of external radiotherapy to the breast  to reduce risks of local recurrence. Dr. Burr Medico will also discuss her course be followed with antiestrogen therapy. We discussed the risks, benefits, short, and long term effects of radiotherapy, as well as the curative intent, and the patient is interested in proceeding but would like to do so closer to home. We will coordinate with Dr. Olena Leatherwood office to see if she can be seen closer to home to consider treatment. She is aware that if she were to be treated in [place], Dr. Lisbeth Renshaw would likely anticipate a role for 4 weeks of radiotherapy to the right breast. We would be happy to see her moving forward, but I've placed a referral for her to meet with Dr. Baruch Gouty to coordinate treatment at the appropriate interval  following discussion and treatment decisions today with Dr. Burr Medico. 2. Left breast lesion. She will follow up with an ultrasound of the left breast that Dr. Donne Hazel has recommended. We will follow this expectantly.  In a visit lasting 60 minutes, greater than 50% of the time was spent face to face reviewing her case, as well as in preparation of, discussing, and coordinating the patient's care.      Carola Rhine, PAC Seen  on behalf of  Jodelle Gross, MD, PhD    **Disclaimer: This note was dictated with voice recognition software. Similar sounding words can inadvertently be transcribed and this note may contain transcription errors which may not have been corrected upon publication of note.**

## 2021-05-08 DIAGNOSIS — R768 Other specified abnormal immunological findings in serum: Secondary | ICD-10-CM | POA: Diagnosis not present

## 2021-05-08 DIAGNOSIS — M25511 Pain in right shoulder: Secondary | ICD-10-CM | POA: Diagnosis not present

## 2021-05-08 DIAGNOSIS — M1990 Unspecified osteoarthritis, unspecified site: Secondary | ICD-10-CM | POA: Diagnosis not present

## 2021-05-08 DIAGNOSIS — M542 Cervicalgia: Secondary | ICD-10-CM | POA: Diagnosis not present

## 2021-05-08 DIAGNOSIS — G5600 Carpal tunnel syndrome, unspecified upper limb: Secondary | ICD-10-CM | POA: Diagnosis not present

## 2021-05-08 DIAGNOSIS — M810 Age-related osteoporosis without current pathological fracture: Secondary | ICD-10-CM | POA: Diagnosis not present

## 2021-05-08 DIAGNOSIS — D0592 Unspecified type of carcinoma in situ of left breast: Secondary | ICD-10-CM | POA: Diagnosis not present

## 2021-05-08 DIAGNOSIS — M5459 Other low back pain: Secondary | ICD-10-CM | POA: Diagnosis not present

## 2021-05-08 DIAGNOSIS — M5412 Radiculopathy, cervical region: Secondary | ICD-10-CM | POA: Diagnosis not present

## 2021-05-10 ENCOUNTER — Encounter: Payer: Self-pay | Admitting: *Deleted

## 2021-05-10 DIAGNOSIS — H524 Presbyopia: Secondary | ICD-10-CM | POA: Diagnosis not present

## 2021-05-10 DIAGNOSIS — H2513 Age-related nuclear cataract, bilateral: Secondary | ICD-10-CM | POA: Diagnosis not present

## 2021-05-10 DIAGNOSIS — H52223 Regular astigmatism, bilateral: Secondary | ICD-10-CM | POA: Diagnosis not present

## 2021-05-10 DIAGNOSIS — H5203 Hypermetropia, bilateral: Secondary | ICD-10-CM | POA: Diagnosis not present

## 2021-05-16 ENCOUNTER — Encounter: Payer: Self-pay | Admitting: Radiation Oncology

## 2021-05-16 ENCOUNTER — Ambulatory Visit
Admission: RE | Admit: 2021-05-16 | Discharge: 2021-05-16 | Disposition: A | Discharge status: Home / Self Care | Payer: Medicare Other | Source: Ambulatory Visit | Attending: Radiation Oncology | Admitting: Radiation Oncology

## 2021-05-16 VITALS — BP 126/99 | HR 70 | Temp 96.4°F | Resp 18 | Ht 61.0 in | Wt 137.0 lb

## 2021-05-16 DIAGNOSIS — C50511 Malignant neoplasm of lower-outer quadrant of right female breast: Secondary | ICD-10-CM | POA: Diagnosis not present

## 2021-05-16 DIAGNOSIS — N644 Mastodynia: Secondary | ICD-10-CM | POA: Diagnosis not present

## 2021-05-16 DIAGNOSIS — Z170 Estrogen receptor positive status [ER+]: Secondary | ICD-10-CM | POA: Diagnosis not present

## 2021-05-16 NOTE — Consult Note (Signed)
NEW PATIENT EVALUATION  Name: Tracy Horne  MRN: [id number]  Date:   05/16/2021     DOB: [birth year]   This 75 y.o. female patient presents to the clinic for initial evaluation of stage Ia (T1 aN0 M0) ER positive PR negative HER2/neu overexpressed overall grade 1 invasive mammary carcinoma the right breast status post wide local excision.  REFERRING PHYSICIAN: Deland Pretty, MD  CHIEF COMPLAINT:  Chief Complaint  Patient presents with   Breast Cancer    DIAGNOSIS: The encounter diagnosis was Malignant neoplasm of lower-outer quadrant of right breast of female, estrogen receptor positive ([place]).   PREVIOUS INVESTIGATIONS:  Mammogram and ultrasound reviewed Clinical notes reviewed Pathology report reviewed  HPI: Patient is a 75 year old female who is being followed by for abnormal mammograms.  In May she had pleomorphic calcifications spanning 1.6 cm in the slightly linear configuration when the right lower lobe 2.7 cm from the nipple.  She underwent ultrasound-guided biopsy showing mammary carcinoma in situ with calcifications and necrosis.  There was atypical lobular hyperplasia also.  Based onE-cadherin studies this was positive in areas of in situ carcinoma consistent with high-grade ductal carcinoma in situ.  She went on to have a wide local excision of the right breast showing invasive ductal carcinoma Nottingham grade 1 of 3.  Tumor span 0.15 cm.  There was high-grade ductal carcinoma in situ.  Invasive carcinoma was margin close at 0.1 cm.  In situ carcinoma was also closely 0.15 cm.  Tumor was strongly ER positive PR negative and HER2/neu 3+.  She has been seen by medical oncology in [place] and based on the small size of her invasive component adjuvant chemotherapy or Herceptin has not been recommended.  She is seen today by radiation oncology she is doing well.  She specifically denies breast tenderness cough or bone pain.  PLANNED TREATMENT REGIMEN: Right whole breast radiation  hypofractionated  PAST MEDICAL HISTORY:  has a past medical history of Arthritis, Headache, Heart murmur, Hypertension, and Thyroid disease.    PAST SURGICAL HISTORY:  Past Surgical History:  Procedure Laterality Date   BREAST EXCISIONAL BIOPSY Left    BREAST LUMPECTOMY WITH RADIOACTIVE SEED LOCALIZATION Right 04/16/2021   Procedure: RIGHT BREAST LUMPECTOMY WITH RADIOACTIVE SEED LOCALIZATION;  Surgeon: Rolm Bookbinder, MD;  Location: [facility];  Service: General;  Laterality: Right;   CARPAL TUNNEL RELEASE Right    PILONIDAL CYST EXCISION     THYROID CYST EXCISION Right    TONSILLECTOMY     removed as a child    FAMILY HISTORY: family history includes Breast cancer in her maternal grandmother; Cancer (age of onset: 27) in her maternal grandmother; Heart attack in her mother; Heart failure in her father; High blood pressure in her brother.  SOCIAL HISTORY:  reports that she has never smoked. She has been exposed to tobacco smoke. She has never used smokeless tobacco. She reports that she does not drink alcohol and does not use drugs.  ALLERGIES: Felodipine and Vitamin d  MEDICATIONS:  Current Outpatient Medications  Medication Sig Dispense Refill   acetaminophen (TYLENOL) 325 MG tablet Take 650 mg by mouth every 6 (six) hours as needed for moderate pain.     amLODipine (NORVASC) 2.5 MG tablet Take 2.5 mg by mouth daily.     aspirin EC 81 MG tablet Take 81 mg by mouth every other day. Swallow whole.     calcium carbonate (TUMS - DOSED IN MG ELEMENTAL CALCIUM) 500 MG chewable tablet Chew 1 tablet by mouth  daily as needed for indigestion or heartburn.     cholecalciferol (VITAMIN D3) 25 MCG (1000 UNIT) tablet Take 1,000 Units by mouth daily.     clonazePAM (KLONOPIN) 0.5 MG tablet Take 0.5 mg by mouth 2 (two) times daily as needed for anxiety.     ketoconazole (NIZORAL) 2 % shampoo 5 ml     Multiple Vitamins-Minerals (HAIR FORMULA EXTRA STRENGTH PO) Take 1 tablet by mouth daily.      Omega-3 1000 MG CAPS Take 1,000 mg by mouth daily.     OVER THE COUNTER MEDICATION Apply 1 application topically at bedtime as needed (pain). Magnesium Cream     Propylene Glycol (SYSTANE BALANCE) 0.6 % SOLN Place 1 drop into both eyes daily as needed (dry eyes).     telmisartan-hydrochlorothiazide (MICARDIS HCT) 80-12.5 MG tablet Take 1 tablet by mouth daily.     valACYclovir (VALTREX) 500 MG tablet TAKE 2 TABLETS(1000 MG) BY MOUTH TWICE DAILY (Patient taking differently: Take 500 mg by mouth 2 (two) times daily as needed (outbreaks).) 20 tablet 0   No current facility-administered medications for this encounter.    ECOG PERFORMANCE STATUS:  0 - Asymptomatic  REVIEW OF SYSTEMS: Patient denies any weight loss, fatigue, weakness, fever, chills or night sweats. Patient denies any loss of vision, blurred vision. Patient denies any ringing  of the ears or hearing loss. No irregular heartbeat. Patient denies heart murmur or history of fainting. Patient denies any chest pain or pain radiating to her upper extremities. Patient denies any shortness of breath, difficulty breathing at night, cough or hemoptysis. Patient denies any swelling in the lower legs. Patient denies any nausea vomiting, vomiting of blood, or coffee ground material in the vomitus. Patient denies any stomach pain. Patient states has had normal bowel movements no significant constipation or diarrhea. Patient denies any dysuria, hematuria or significant nocturia. Patient denies any problems walking, swelling in the joints or loss of balance. Patient denies any skin changes, loss of hair or loss of weight. Patient denies any excessive worrying or anxiety or significant depression. Patient denies any problems with insomnia. Patient denies excessive thirst, polyuria, polydipsia. Patient denies any swollen glands, patient denies easy bruising or easy bleeding. Patient denies any recent infections, allergies or URI. Patient "s visual fields have not  changed significantly in recent time.   PHYSICAL EXAM: BP (!) 126/99 (BP Location: Left Arm, Patient Position: Sitting)   Pulse 70   Temp (!) 96.4 F (35.8 C)   Resp 18   Ht $R'5\' 1"'cT$  (1.549 m)   Wt 137 lb (62.1 kg)   BMI 25.89 kg/m  She is status post wide local excision of the right breast around the nipple areolar complex.  No dominant masses noted in either breast.  No axillary or supraclavicular adenopathy is identified.  Well-developed well-nourished patient in NAD. HEENT reveals PERLA, EOMI, discs not visualized.  Oral cavity is clear. No oral mucosal lesions are identified. Neck is clear without evidence of cervical or supraclavicular adenopathy. Lungs are clear to A&P. Cardiac examination is essentially unremarkable with regular rate and rhythm without murmur rub or thrill. Abdomen is benign with no organomegaly or masses noted. Motor sensory and DTR levels are equal and symmetric in the upper and lower extremities. Cranial nerves II through XII are grossly intact. Proprioception is intact. No peripheral adenopathy or edema is identified. No motor or sensory levels are noted. Crude visual fields are within normal range.  LABORATORY DATA: Pathology report reviewed  RADIOLOGY RESULTS: Mammogram and ultrasound results reviewed compatible with above-stated findings   IMPRESSION: Stage Ia small focus of ER positive HER2/neu overexpressed invasive mammary carcinoma the right breast status post wide local excision in 75 year old female  PLAN: At this time I would go ahead with hypofractionated whole breast radiation to her right breast over 3 weeks.  I would also boost her scar another 1600 cGy using electron beam based on the close margins both for DCIS and the invasive component.  I would also opt not to treat her axillary lymph nodes based on the small invasive component and minimal chance of axillary lymph node involvement.  Risks and benefits of treatment including skin reaction fatigue  alteration of blood counts all were reviewed with the patient and her son in detail.  I have personally set up and ordered CT simulation for later this week.  Patient also will benefit from antiestrogen therapy after completion of radiation.  I would like to take this opportunity to thank you for allowing me to participate in the care of your patient.Noreene Filbert, MD

## 2021-05-17 ENCOUNTER — Ambulatory Visit
Admission: RE | Admit: 2021-05-17 | Discharge: 2021-05-17 | Disposition: A | Discharge status: Home / Self Care | Payer: Medicare Other | Source: Ambulatory Visit | Attending: Radiation Oncology | Admitting: Radiation Oncology

## 2021-05-17 DIAGNOSIS — C50511 Malignant neoplasm of lower-outer quadrant of right female breast: Secondary | ICD-10-CM | POA: Insufficient documentation

## 2021-05-17 DIAGNOSIS — Z510 Encounter for antineoplastic radiation therapy: Secondary | ICD-10-CM | POA: Insufficient documentation

## 2021-05-17 DIAGNOSIS — Z170 Estrogen receptor positive status [ER+]: Secondary | ICD-10-CM | POA: Insufficient documentation

## 2021-05-21 DIAGNOSIS — C50511 Malignant neoplasm of lower-outer quadrant of right female breast: Secondary | ICD-10-CM | POA: Diagnosis not present

## 2021-05-21 DIAGNOSIS — Z170 Estrogen receptor positive status [ER+]: Secondary | ICD-10-CM | POA: Diagnosis not present

## 2021-05-22 ENCOUNTER — Encounter: Payer: Self-pay | Admitting: *Deleted

## 2021-05-23 ENCOUNTER — Other Ambulatory Visit: Payer: Self-pay | Admitting: *Deleted

## 2021-05-23 DIAGNOSIS — Z170 Estrogen receptor positive status [ER+]: Secondary | ICD-10-CM

## 2021-05-23 DIAGNOSIS — C50511 Malignant neoplasm of lower-outer quadrant of right female breast: Secondary | ICD-10-CM

## 2021-05-24 ENCOUNTER — Ambulatory Visit: Admission: RE | Admit: 2021-05-24 | Payer: Medicare Other | Source: Ambulatory Visit

## 2021-05-24 DIAGNOSIS — Z170 Estrogen receptor positive status [ER+]: Secondary | ICD-10-CM | POA: Diagnosis not present

## 2021-05-24 DIAGNOSIS — C50511 Malignant neoplasm of lower-outer quadrant of right female breast: Secondary | ICD-10-CM | POA: Diagnosis not present

## 2021-05-28 ENCOUNTER — Telehealth: Payer: Self-pay | Admitting: Hematology

## 2021-05-28 ENCOUNTER — Ambulatory Visit
Admission: RE | Admit: 2021-05-28 | Discharge: 2021-05-28 | Disposition: A | Discharge status: Home / Self Care | Payer: Medicare Other | Source: Ambulatory Visit | Attending: Radiation Oncology | Admitting: Radiation Oncology

## 2021-05-28 DIAGNOSIS — Z170 Estrogen receptor positive status [ER+]: Secondary | ICD-10-CM | POA: Diagnosis not present

## 2021-05-28 DIAGNOSIS — C50511 Malignant neoplasm of lower-outer quadrant of right female breast: Secondary | ICD-10-CM | POA: Diagnosis not present

## 2021-05-28 NOTE — Telephone Encounter (Signed)
Scheduled appointment per 07/12 scg msg. Left message.

## 2021-05-29 ENCOUNTER — Ambulatory Visit
Admission: RE | Admit: 2021-05-29 | Discharge: 2021-05-29 | Disposition: A | Discharge status: Home / Self Care | Payer: Medicare Other | Source: Ambulatory Visit | Attending: Radiation Oncology | Admitting: Radiation Oncology

## 2021-05-29 DIAGNOSIS — Z170 Estrogen receptor positive status [ER+]: Secondary | ICD-10-CM | POA: Diagnosis not present

## 2021-05-29 DIAGNOSIS — C50511 Malignant neoplasm of lower-outer quadrant of right female breast: Secondary | ICD-10-CM | POA: Diagnosis not present

## 2021-05-30 ENCOUNTER — Ambulatory Visit
Admission: RE | Admit: 2021-05-30 | Discharge: 2021-05-30 | Disposition: A | Discharge status: Home / Self Care | Payer: Medicare Other | Source: Ambulatory Visit | Attending: Radiation Oncology | Admitting: Radiation Oncology

## 2021-05-30 DIAGNOSIS — C50511 Malignant neoplasm of lower-outer quadrant of right female breast: Secondary | ICD-10-CM | POA: Diagnosis not present

## 2021-05-30 DIAGNOSIS — Z170 Estrogen receptor positive status [ER+]: Secondary | ICD-10-CM | POA: Diagnosis not present

## 2021-05-31 ENCOUNTER — Ambulatory Visit
Admission: RE | Admit: 2021-05-31 | Discharge: 2021-05-31 | Disposition: A | Discharge status: Home / Self Care | Payer: Medicare Other | Source: Ambulatory Visit | Attending: Radiation Oncology | Admitting: Radiation Oncology

## 2021-05-31 DIAGNOSIS — Z170 Estrogen receptor positive status [ER+]: Secondary | ICD-10-CM | POA: Diagnosis not present

## 2021-05-31 DIAGNOSIS — C50511 Malignant neoplasm of lower-outer quadrant of right female breast: Secondary | ICD-10-CM | POA: Diagnosis not present

## 2021-06-01 ENCOUNTER — Ambulatory Visit
Admission: RE | Admit: 2021-06-01 | Discharge: 2021-06-01 | Disposition: A | Discharge status: Home / Self Care | Payer: Medicare Other | Source: Ambulatory Visit | Attending: Radiation Oncology | Admitting: Radiation Oncology

## 2021-06-01 DIAGNOSIS — Z170 Estrogen receptor positive status [ER+]: Secondary | ICD-10-CM | POA: Diagnosis not present

## 2021-06-01 DIAGNOSIS — C50511 Malignant neoplasm of lower-outer quadrant of right female breast: Secondary | ICD-10-CM | POA: Diagnosis not present

## 2021-06-04 ENCOUNTER — Ambulatory Visit
Admission: RE | Admit: 2021-06-04 | Discharge: 2021-06-04 | Disposition: A | Discharge status: Home / Self Care | Payer: Medicare Other | Source: Ambulatory Visit | Attending: Radiation Oncology | Admitting: Radiation Oncology

## 2021-06-04 DIAGNOSIS — C50511 Malignant neoplasm of lower-outer quadrant of right female breast: Secondary | ICD-10-CM | POA: Diagnosis not present

## 2021-06-04 DIAGNOSIS — Z170 Estrogen receptor positive status [ER+]: Secondary | ICD-10-CM | POA: Diagnosis not present

## 2021-06-05 ENCOUNTER — Ambulatory Visit
Admission: RE | Admit: 2021-06-05 | Discharge: 2021-06-05 | Disposition: A | Discharge status: Home / Self Care | Payer: Medicare Other | Source: Ambulatory Visit | Attending: Radiation Oncology | Admitting: Radiation Oncology

## 2021-06-05 DIAGNOSIS — Z170 Estrogen receptor positive status [ER+]: Secondary | ICD-10-CM | POA: Diagnosis not present

## 2021-06-05 DIAGNOSIS — C50511 Malignant neoplasm of lower-outer quadrant of right female breast: Secondary | ICD-10-CM | POA: Diagnosis not present

## 2021-06-06 ENCOUNTER — Ambulatory Visit
Admission: RE | Admit: 2021-06-06 | Discharge: 2021-06-06 | Disposition: A | Discharge status: Home / Self Care | Payer: Medicare Other | Source: Ambulatory Visit | Attending: Radiation Oncology | Admitting: Radiation Oncology

## 2021-06-06 DIAGNOSIS — C50511 Malignant neoplasm of lower-outer quadrant of right female breast: Secondary | ICD-10-CM | POA: Diagnosis not present

## 2021-06-06 DIAGNOSIS — Z170 Estrogen receptor positive status [ER+]: Secondary | ICD-10-CM | POA: Diagnosis not present

## 2021-06-07 ENCOUNTER — Other Ambulatory Visit: Payer: Self-pay

## 2021-06-07 ENCOUNTER — Ambulatory Visit
Admission: RE | Admit: 2021-06-07 | Discharge: 2021-06-07 | Disposition: A | Discharge status: Home / Self Care | Payer: Medicare Other | Source: Ambulatory Visit | Attending: Radiation Oncology | Admitting: Radiation Oncology

## 2021-06-07 ENCOUNTER — Inpatient Hospital Stay: Payer: Medicare Other | Attending: Radiation Oncology

## 2021-06-07 DIAGNOSIS — Z170 Estrogen receptor positive status [ER+]: Secondary | ICD-10-CM | POA: Insufficient documentation

## 2021-06-07 DIAGNOSIS — C50511 Malignant neoplasm of lower-outer quadrant of right female breast: Secondary | ICD-10-CM

## 2021-06-07 LAB — CBC
HCT: 42.2 % (ref 36.0–46.0)
Hemoglobin: 14.2 g/dL (ref 12.0–15.0)
MCH: 31 pg (ref 26.0–34.0)
MCHC: 33.6 g/dL (ref 30.0–36.0)
MCV: 92.1 fL (ref 80.0–100.0)
Platelets: 192 10*3/uL (ref 150–400)
RBC: 4.58 MIL/uL (ref 3.87–5.11)
RDW: 12.2 % (ref 11.5–15.5)
WBC: 3.2 10*3/uL — ABNORMAL LOW (ref 4.0–10.5)
nRBC: 0 % (ref 0.0–0.2)

## 2021-06-08 ENCOUNTER — Ambulatory Visit
Admission: RE | Admit: 2021-06-08 | Discharge: 2021-06-08 | Disposition: A | Discharge status: Home / Self Care | Payer: Medicare Other | Source: Ambulatory Visit | Attending: Radiation Oncology | Admitting: Radiation Oncology

## 2021-06-08 DIAGNOSIS — C50511 Malignant neoplasm of lower-outer quadrant of right female breast: Secondary | ICD-10-CM | POA: Diagnosis not present

## 2021-06-08 DIAGNOSIS — Z170 Estrogen receptor positive status [ER+]: Secondary | ICD-10-CM | POA: Diagnosis not present

## 2021-06-11 ENCOUNTER — Ambulatory Visit
Admission: RE | Admit: 2021-06-11 | Discharge: 2021-06-11 | Disposition: A | Discharge status: Home / Self Care | Payer: Medicare Other | Source: Ambulatory Visit | Attending: Radiation Oncology | Admitting: Radiation Oncology

## 2021-06-11 DIAGNOSIS — Z510 Encounter for antineoplastic radiation therapy: Secondary | ICD-10-CM | POA: Diagnosis not present

## 2021-06-11 DIAGNOSIS — C50511 Malignant neoplasm of lower-outer quadrant of right female breast: Secondary | ICD-10-CM | POA: Insufficient documentation

## 2021-06-11 DIAGNOSIS — Z170 Estrogen receptor positive status [ER+]: Secondary | ICD-10-CM | POA: Diagnosis not present

## 2021-06-12 ENCOUNTER — Ambulatory Visit
Admission: RE | Admit: 2021-06-12 | Discharge: 2021-06-12 | Disposition: A | Discharge status: Home / Self Care | Payer: Medicare Other | Source: Ambulatory Visit | Attending: Radiation Oncology | Admitting: Radiation Oncology

## 2021-06-12 DIAGNOSIS — Z510 Encounter for antineoplastic radiation therapy: Secondary | ICD-10-CM | POA: Diagnosis not present

## 2021-06-12 DIAGNOSIS — Z170 Estrogen receptor positive status [ER+]: Secondary | ICD-10-CM | POA: Diagnosis not present

## 2021-06-12 DIAGNOSIS — C50511 Malignant neoplasm of lower-outer quadrant of right female breast: Secondary | ICD-10-CM | POA: Diagnosis not present

## 2021-06-13 ENCOUNTER — Other Ambulatory Visit: Payer: Self-pay | Admitting: Licensed Clinical Social Worker

## 2021-06-13 ENCOUNTER — Ambulatory Visit
Admission: RE | Admit: 2021-06-13 | Discharge: 2021-06-13 | Disposition: A | Discharge status: Home / Self Care | Payer: Medicare Other | Source: Ambulatory Visit | Attending: Radiation Oncology | Admitting: Radiation Oncology

## 2021-06-13 DIAGNOSIS — Z170 Estrogen receptor positive status [ER+]: Secondary | ICD-10-CM | POA: Diagnosis not present

## 2021-06-13 DIAGNOSIS — Z510 Encounter for antineoplastic radiation therapy: Secondary | ICD-10-CM | POA: Diagnosis not present

## 2021-06-13 DIAGNOSIS — C50511 Malignant neoplasm of lower-outer quadrant of right female breast: Secondary | ICD-10-CM

## 2021-06-13 MED ORDER — METHYLPREDNISOLONE 4 MG PO TBPK
ORAL_TABLET | ORAL | 0 refills | Status: DC
Start: 2021-06-13 — End: 2021-08-16

## 2021-06-14 ENCOUNTER — Ambulatory Visit
Admission: RE | Admit: 2021-06-14 | Discharge: 2021-06-14 | Disposition: A | Discharge status: Home / Self Care | Payer: Medicare Other | Source: Ambulatory Visit | Attending: Radiation Oncology | Admitting: Radiation Oncology

## 2021-06-14 DIAGNOSIS — Z510 Encounter for antineoplastic radiation therapy: Secondary | ICD-10-CM | POA: Diagnosis not present

## 2021-06-14 DIAGNOSIS — C50511 Malignant neoplasm of lower-outer quadrant of right female breast: Secondary | ICD-10-CM | POA: Diagnosis not present

## 2021-06-14 DIAGNOSIS — Z170 Estrogen receptor positive status [ER+]: Secondary | ICD-10-CM | POA: Diagnosis not present

## 2021-06-15 ENCOUNTER — Ambulatory Visit
Admission: RE | Admit: 2021-06-15 | Discharge: 2021-06-15 | Disposition: A | Discharge status: Home / Self Care | Payer: Medicare Other | Source: Ambulatory Visit | Attending: Radiation Oncology | Admitting: Radiation Oncology

## 2021-06-15 DIAGNOSIS — C50511 Malignant neoplasm of lower-outer quadrant of right female breast: Secondary | ICD-10-CM | POA: Diagnosis not present

## 2021-06-15 DIAGNOSIS — Z170 Estrogen receptor positive status [ER+]: Secondary | ICD-10-CM | POA: Diagnosis not present

## 2021-06-15 DIAGNOSIS — Z510 Encounter for antineoplastic radiation therapy: Secondary | ICD-10-CM | POA: Diagnosis not present

## 2021-06-18 ENCOUNTER — Ambulatory Visit
Admission: RE | Admit: 2021-06-18 | Discharge: 2021-06-18 | Disposition: A | Discharge status: Home / Self Care | Payer: Medicare Other | Source: Ambulatory Visit | Attending: Radiation Oncology | Admitting: Radiation Oncology

## 2021-06-18 DIAGNOSIS — Z510 Encounter for antineoplastic radiation therapy: Secondary | ICD-10-CM | POA: Diagnosis not present

## 2021-06-18 DIAGNOSIS — C50511 Malignant neoplasm of lower-outer quadrant of right female breast: Secondary | ICD-10-CM | POA: Diagnosis not present

## 2021-06-18 DIAGNOSIS — Z170 Estrogen receptor positive status [ER+]: Secondary | ICD-10-CM | POA: Diagnosis not present

## 2021-06-19 ENCOUNTER — Ambulatory Visit
Admission: RE | Admit: 2021-06-19 | Discharge: 2021-06-19 | Disposition: A | Discharge status: Home / Self Care | Payer: Medicare Other | Source: Ambulatory Visit | Attending: Radiation Oncology | Admitting: Radiation Oncology

## 2021-06-19 DIAGNOSIS — Z170 Estrogen receptor positive status [ER+]: Secondary | ICD-10-CM | POA: Diagnosis not present

## 2021-06-19 DIAGNOSIS — C50511 Malignant neoplasm of lower-outer quadrant of right female breast: Secondary | ICD-10-CM | POA: Diagnosis not present

## 2021-06-19 DIAGNOSIS — Z510 Encounter for antineoplastic radiation therapy: Secondary | ICD-10-CM | POA: Diagnosis not present

## 2021-06-20 ENCOUNTER — Ambulatory Visit
Admission: RE | Admit: 2021-06-20 | Discharge: 2021-06-20 | Disposition: A | Discharge status: Home / Self Care | Payer: Medicare Other | Source: Ambulatory Visit | Attending: Radiation Oncology | Admitting: Radiation Oncology

## 2021-06-20 DIAGNOSIS — Z170 Estrogen receptor positive status [ER+]: Secondary | ICD-10-CM | POA: Diagnosis not present

## 2021-06-20 DIAGNOSIS — Z510 Encounter for antineoplastic radiation therapy: Secondary | ICD-10-CM | POA: Diagnosis not present

## 2021-06-20 DIAGNOSIS — C50511 Malignant neoplasm of lower-outer quadrant of right female breast: Secondary | ICD-10-CM | POA: Diagnosis not present

## 2021-06-21 ENCOUNTER — Ambulatory Visit
Admission: RE | Admit: 2021-06-21 | Discharge: 2021-06-21 | Disposition: A | Discharge status: Home / Self Care | Payer: Medicare Other | Source: Ambulatory Visit | Attending: Radiation Oncology | Admitting: Radiation Oncology

## 2021-06-21 ENCOUNTER — Inpatient Hospital Stay: Payer: Medicare Other

## 2021-06-21 DIAGNOSIS — Z79811 Long term (current) use of aromatase inhibitors: Secondary | ICD-10-CM | POA: Insufficient documentation

## 2021-06-21 DIAGNOSIS — Z170 Estrogen receptor positive status [ER+]: Secondary | ICD-10-CM | POA: Insufficient documentation

## 2021-06-21 DIAGNOSIS — C50511 Malignant neoplasm of lower-outer quadrant of right female breast: Secondary | ICD-10-CM

## 2021-06-21 DIAGNOSIS — Z510 Encounter for antineoplastic radiation therapy: Secondary | ICD-10-CM | POA: Diagnosis not present

## 2021-06-21 LAB — CBC
HCT: 41.8 % (ref 36.0–46.0)
Hemoglobin: 14.2 g/dL (ref 12.0–15.0)
MCH: 31.3 pg (ref 26.0–34.0)
MCHC: 34 g/dL (ref 30.0–36.0)
MCV: 92.3 fL (ref 80.0–100.0)
Platelets: 213 10*3/uL (ref 150–400)
RBC: 4.53 MIL/uL (ref 3.87–5.11)
RDW: 12.5 % (ref 11.5–15.5)
WBC: 5.7 10*3/uL (ref 4.0–10.5)
nRBC: 0 % (ref 0.0–0.2)

## 2021-06-22 ENCOUNTER — Ambulatory Visit
Admission: RE | Admit: 2021-06-22 | Discharge: 2021-06-22 | Disposition: A | Discharge status: Home / Self Care | Payer: Medicare Other | Source: Ambulatory Visit | Attending: Radiation Oncology | Admitting: Radiation Oncology

## 2021-06-22 DIAGNOSIS — C50511 Malignant neoplasm of lower-outer quadrant of right female breast: Secondary | ICD-10-CM | POA: Diagnosis not present

## 2021-06-22 DIAGNOSIS — Z510 Encounter for antineoplastic radiation therapy: Secondary | ICD-10-CM | POA: Diagnosis not present

## 2021-06-22 DIAGNOSIS — Z170 Estrogen receptor positive status [ER+]: Secondary | ICD-10-CM | POA: Diagnosis not present

## 2021-06-25 ENCOUNTER — Ambulatory Visit
Admission: RE | Admit: 2021-06-25 | Discharge: 2021-06-25 | Disposition: A | Discharge status: Home / Self Care | Payer: Medicare Other | Source: Ambulatory Visit | Attending: Radiation Oncology | Admitting: Radiation Oncology

## 2021-06-25 DIAGNOSIS — Z510 Encounter for antineoplastic radiation therapy: Secondary | ICD-10-CM | POA: Diagnosis not present

## 2021-06-25 DIAGNOSIS — C50511 Malignant neoplasm of lower-outer quadrant of right female breast: Secondary | ICD-10-CM | POA: Diagnosis not present

## 2021-06-25 DIAGNOSIS — Z170 Estrogen receptor positive status [ER+]: Secondary | ICD-10-CM | POA: Diagnosis not present

## 2021-06-26 ENCOUNTER — Ambulatory Visit
Admission: RE | Admit: 2021-06-26 | Discharge: 2021-06-26 | Disposition: A | Discharge status: Home / Self Care | Payer: Medicare Other | Source: Ambulatory Visit | Attending: Radiation Oncology | Admitting: Radiation Oncology

## 2021-06-26 DIAGNOSIS — Z510 Encounter for antineoplastic radiation therapy: Secondary | ICD-10-CM | POA: Diagnosis not present

## 2021-06-26 DIAGNOSIS — Z170 Estrogen receptor positive status [ER+]: Secondary | ICD-10-CM | POA: Diagnosis not present

## 2021-06-26 DIAGNOSIS — C50511 Malignant neoplasm of lower-outer quadrant of right female breast: Secondary | ICD-10-CM | POA: Diagnosis not present

## 2021-06-27 ENCOUNTER — Encounter: Payer: Self-pay | Admitting: *Deleted

## 2021-06-27 ENCOUNTER — Ambulatory Visit
Admission: RE | Admit: 2021-06-27 | Discharge: 2021-06-27 | Disposition: A | Discharge status: Home / Self Care | Payer: Medicare Other | Source: Ambulatory Visit | Attending: Radiation Oncology | Admitting: Radiation Oncology

## 2021-06-27 DIAGNOSIS — C50511 Malignant neoplasm of lower-outer quadrant of right female breast: Secondary | ICD-10-CM

## 2021-06-27 DIAGNOSIS — Z510 Encounter for antineoplastic radiation therapy: Secondary | ICD-10-CM | POA: Diagnosis not present

## 2021-06-27 DIAGNOSIS — Z170 Estrogen receptor positive status [ER+]: Secondary | ICD-10-CM

## 2021-06-28 ENCOUNTER — Ambulatory Visit
Admission: RE | Admit: 2021-06-28 | Discharge: 2021-06-28 | Disposition: A | Discharge status: Home / Self Care | Payer: Medicare Other | Source: Ambulatory Visit | Attending: Radiation Oncology | Admitting: Radiation Oncology

## 2021-06-28 DIAGNOSIS — Z510 Encounter for antineoplastic radiation therapy: Secondary | ICD-10-CM | POA: Diagnosis not present

## 2021-06-28 DIAGNOSIS — Z170 Estrogen receptor positive status [ER+]: Secondary | ICD-10-CM | POA: Diagnosis not present

## 2021-06-28 DIAGNOSIS — C50511 Malignant neoplasm of lower-outer quadrant of right female breast: Secondary | ICD-10-CM | POA: Diagnosis not present

## 2021-06-29 ENCOUNTER — Other Ambulatory Visit: Payer: Self-pay

## 2021-06-29 ENCOUNTER — Inpatient Hospital Stay: Payer: Medicare Other | Attending: Hematology | Admitting: Hematology

## 2021-06-29 VITALS — BP 125/90 | HR 73 | Temp 98.6°F | Resp 18 | Wt 137.8 lb

## 2021-06-29 DIAGNOSIS — C50511 Malignant neoplasm of lower-outer quadrant of right female breast: Secondary | ICD-10-CM | POA: Insufficient documentation

## 2021-06-29 DIAGNOSIS — Z7952 Long term (current) use of systemic steroids: Secondary | ICD-10-CM | POA: Insufficient documentation

## 2021-06-29 DIAGNOSIS — M8580 Other specified disorders of bone density and structure, unspecified site: Secondary | ICD-10-CM | POA: Diagnosis not present

## 2021-06-29 DIAGNOSIS — I10 Essential (primary) hypertension: Secondary | ICD-10-CM | POA: Insufficient documentation

## 2021-06-29 DIAGNOSIS — E079 Disorder of thyroid, unspecified: Secondary | ICD-10-CM | POA: Diagnosis not present

## 2021-06-29 DIAGNOSIS — Z79899 Other long term (current) drug therapy: Secondary | ICD-10-CM | POA: Diagnosis not present

## 2021-06-29 DIAGNOSIS — Z923 Personal history of irradiation: Secondary | ICD-10-CM | POA: Insufficient documentation

## 2021-06-29 DIAGNOSIS — Z79811 Long term (current) use of aromatase inhibitors: Secondary | ICD-10-CM | POA: Diagnosis not present

## 2021-06-29 DIAGNOSIS — Z170 Estrogen receptor positive status [ER+]: Secondary | ICD-10-CM

## 2021-06-29 DIAGNOSIS — M25511 Pain in right shoulder: Secondary | ICD-10-CM | POA: Diagnosis not present

## 2021-06-29 MED ORDER — ANASTROZOLE 1 MG PO TABS: 1.0000 mg | ORAL_TABLET | Freq: Every day | ORAL | 2 refills | Status: DC

## 2021-06-29 NOTE — Progress Notes (Signed)
[facility]   [phone] [phone]   Clinic Follow up Note   Patient Care Team: Pharr, Walter, MD as PCP - General (Internal Medicine) Syed, Tauseef G, MD (Rheumatology) Kim, James, MD as Consulting Physician (Internal Medicine) Martini, Keisha N, RN as Oncology Nurse Navigator Stuart, Dawn C, RN as Oncology Nurse Navigator Wakefield, Matthew, MD as Consulting Physician (General Surgery) Moody, John, MD as Consulting Physician (Radiation Oncology)  Date of Service:  06/29/2021  CHIEF COMPLAINT: f/u of right breast cancer  SUMMARY OF ONCOLOGIC HISTORY: Oncology History Overview Note  Cancer Staging Malignant neoplasm of lower-outer quadrant of right breast of female, estrogen receptor positive (HCC) Staging form: Breast, AJCC 8th Edition - Pathologic stage from 04/16/2021: Stage Unknown (pT1a, pNX, G1, ER+, PR-, HER2+) - Signed by Feng, Yan, MD on 05/06/2021 Stage prefix: Initial diagnosis Multigene prognostic tests performed: None Histologic grading system: 3 grade system Residual tumor (R): R0 - None    Malignant neoplasm of lower-outer quadrant of right breast of female, estrogen receptor positive (HCC)  08/16/2020 Pathology Results   Diagnosis Breast, right, needle core biopsy - LOBULAR NEOPLASIA (ATYPICAL LOBULAR HYPERPLASIA) WITH MICROCALCIFICATIONS - SEE COMMENT Microscopic Comment These results were called to [facility] on August 17, 2020.   03/13/2021 Mammogram   Right mammogram 03/13/21 There are pleomorphic calcifications spanning 1.6cm in the slightly linear configuration within the right lower outer breast anterior to middle depth 2.7cm from the nipple. These are increased in number. No other significant masses or calcifications.    Left Mammogram on 03/20/21  Negative    03/20/2021 Initial Biopsy   Diagnosis Breast, right, needle core biopsy, lower outer, 2.7cmfn, anterior depth - MAMMARY CARCINOMA IN SITU WITH  CALCIFICATIONS AND NECROSIS. - LOBULAR NEOPLASIA (ATYPICAL LOBULAR HYPERPLASIA). Microscopic Comment The carcinoma appears high grade. E-cadherin is pending and will be reported in an addendum. Dr. Patrick has reviewed the case. The case was called to Dr. Hawkins on 03/21/2021.  E-cadherin is positive in the areas of in situ carcinoma and thus, these areas are consistent with high grade ductal carcinoma in situ. E-cadherin is negative in areas of atypical lobular hyperplasia.   04/16/2021 Cancer Staging   Staging form: Breast, AJCC 8th Edition - Pathologic stage from 04/16/2021: Stage Unknown (pT1a, pNX, G1, ER+, PR-, HER2+) - Signed by Feng, Yan, MD on 05/06/2021 Stage prefix: Initial diagnosis Multigene prognostic tests performed: None Histologic grading system: 3 grade system Residual tumor (R): R0 - None   04/16/2021 Surgery   RIGHT BREAST LUMPECTOMY WITH RADIOACTIVE SEED LOCALIZATION by Dr Wakefield    FINAL MICROSCOPIC DIAGNOSIS:   A. 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   All controls stained appropriately.    05/06/2021 Initial   Diagnosis   Malignant neoplasm of lower-outer quadrant of right breast of female, estrogen receptor positive (HCC)      CURRENT THERAPY:  To start anastrozole  INTERVAL HISTORY:  [name] is  here for a follow up of breast cancer. She was last seen by me on 05/07/21 in breast clinic. She presents to the clinic accompanied by her son. She reports she completed radiation treatment yesterday and tolerated this well. She notes it ended right when her skin began to react/burn, so she is thankful for that. She reports some fatigue.  She also reports some chronic issues with her left shoulder, likely worsened by the positioning of radiation. She notes the pain can travel down her arm, causing issues putting on and taking off her clothes. She notes her range of motion is very limited. She notes she was referred to PT but declined at that time to figure out what was going on first.   All other systems were reviewed with the patient and are negative.  MEDICAL HISTORY:  Past Medical History:  Diagnosis Date   Arthritis    shoulders and lower back per patient   Headache    Heart murmur    Hypertension    Thyroid disease     SURGICAL HISTORY: Past Surgical History:  Procedure Laterality Date   BREAST EXCISIONAL BIOPSY Left    BREAST LUMPECTOMY WITH RADIOACTIVE SEED LOCALIZATION Right 04/16/2021   Procedure: RIGHT BREAST LUMPECTOMY WITH RADIOACTIVE SEED LOCALIZATION;  Surgeon: Wakefield, Matthew, MD;  Location: [facility] OR;  Service: General;  Laterality: Right;   CARPAL TUNNEL RELEASE Right    PILONIDAL CYST EXCISION     THYROID CYST EXCISION Right    TONSILLECTOMY     removed as a child    I have reviewed the social history and family history with the patient and they are unchanged from previous note.  ALLERGIES:  is allergic to felodipine and vitamin d.  MEDICATIONS:  Current Outpatient Medications  Medication Sig Dispense Refill   anastrozole (ARIMIDEX) 1 MG tablet Take 1 tablet (1 mg total) by mouth daily. 30 tablet 2   acetaminophen (TYLENOL) 325 MG tablet Take 650 mg by mouth every 6 (six) hours as needed for moderate pain.     amLODipine (NORVASC) 2.5 MG tablet Take 2.5 mg by  mouth daily.     aspirin EC 81 MG tablet Take 81 mg by mouth every other day. Swallow whole.     calcium carbonate (TUMS - DOSED IN MG ELEMENTAL CALCIUM) 500 MG chewable tablet Chew 1 tablet by mouth daily as needed for indigestion or heartburn.     cholecalciferol (VITAMIN D3) 25 MCG (1000 UNIT) tablet Take 1,000 Units by mouth daily.     clonazePAM (KLONOPIN) 0.5 MG tablet Take 0.5 mg by mouth 2 (two) times daily as needed for anxiety.     ketoconazole (NIZORAL) 2 % shampoo 5 ml     methylPREDNISolone (MEDROL DOSEPAK) 4 MG TBPK tablet 1 dose pack follow package directions 21 tablet 0   Multiple Vitamins-Minerals (HAIR FORMULA EXTRA STRENGTH PO) Take 1 tablet by mouth daily.     Omega-3 1000 MG CAPS Take 1,000 mg by mouth daily.     OVER THE COUNTER MEDICATION Apply 1 application topically at bedtime as needed (pain). Magnesium Cream     Propylene Glycol (SYSTANE BALANCE) 0.6 % SOLN Place 1 drop into both eyes daily as needed (dry eyes).     telmisartan-hydrochlorothiazide (MICARDIS HCT) 80-12.5 MG tablet Take 1   tablet by mouth daily.     valACYclovir (VALTREX) 500 MG tablet TAKE 2 TABLETS(1000 MG) BY MOUTH TWICE DAILY (Patient taking differently: Take 500 mg by mouth 2 (two) times daily as needed (outbreaks).) 20 tablet 0   No current facility-administered medications for this visit.    PHYSICAL EXAMINATION: ECOG PERFORMANCE STATUS: 0 - Asymptomatic  Vitals:   06/29/21 1117  BP: 125/90  Pulse: 73  Resp: 18  Temp: 98.6 F (37 C)  SpO2: 100%   Wt Readings from Last 3 Encounters:  06/29/21 137 lb 12.8 oz (62.5 kg)  05/16/21 137 lb (62.1 kg)  05/07/21 139 lb 4.8 oz (63.2 kg)     GENERAL:alert, no distress and comfortable SKIN: skin color, texture, turgor are normal, no rashes or significant lesions EYES: normal, Conjunctiva are pink and non-injected, sclera clear  NEURO: alert & oriented x 3 with fluent speech, no focal motor/sensory deficits  LABORATORY DATA:  I have reviewed  the data as listed CBC Latest Ref Rng & Units 06/21/2021 06/07/2021 04/10/2021  WBC 4.0 - 10.5 K/uL 5.7 3.2(L) 3.7(L)  Hemoglobin 12.0 - 15.0 g/dL 14.2 14.2 13.8  Hematocrit 36.0 - 46.0 % 41.8 42.2 40.8  Platelets 150 - 400 K/uL 213 192 211     CMP Latest Ref Rng & Units 04/10/2021 10/29/2010  Glucose 70 - 99 mg/dL 98 94  BUN 8 - 23 mg/dL 14 11  Creatinine 0.44 - 1.00 mg/dL 0.64 0.69  Sodium 135 - 145 mmol/L 138 145  Potassium 3.5 - 5.1 mmol/L 3.7 4.5  Chloride 98 - 111 mmol/L 102 106  CO2 22 - 32 mmol/L 28 30  Calcium 8.9 - 10.3 mg/dL 9.6 9.8      RADIOGRAPHIC STUDIES: I have personally reviewed the radiological images as listed and agreed with the findings in the report. No results found.   ASSESSMENT & PLAN:  Tracy Horne is a 75 y.o. female with   1. Malignant neoplasm of lower-outer quadrant of right breast, Stage I, p(T1aN0M0), ER+/PR-/HER2+, Grade I  -She was seen to have ALH on 08/16/20 right breast biopsy. There was progression on 03/13/21 mammogram. 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  -She underwent right lumpectomy on 04/16/21 with Dr Wakefield. Surgical pathology showed a 0.15cm grade I invasive ductal carcinoma and components of high grade DCIS, ER/HER2 positive and ER negative.  -due to minimal benefit, we deferred chemo and Her2 antibody treatment. -She received radiation therapy from 05/28/21 through 06/28/21, tolerated well overall  -Given the strong ER expression in her postmenopausal status, I recommend adjuvant endocrine therapy with aromatase inhibitor Anastrozole for a total of 5 years to reduce the risk of cancer recurrence.  --The potential benefit and side effects, which includes but not limited to, hot flash, skin and vaginal dryness, metabolic changes ( increased blood glucose, cholesterol, weight, etc.), slightly in increased risk of cardiovascular disease, cataracts, muscular and joint discomfort, osteopenia and osteoporosis, etc,  were discussed with her in great details. She is interested, she will start in about a month.   2. Chronic right shoulder pain -present prior to breast cancer, but had mostly resolved. Recurred and increased following surgery. It became worse with positioning for radiation. -She was offered PT previously for her breast cancer, but she declined to further evaluate her shoulder first. -I recommend she see her orthopedic surgeon for further work up. She agrees.   3. Bone Health  -DEXA on 05/08/21 showed osteopenia (-2.1 in L1-4) -she reports   she has tried alendronate but experienced increasing bone pain. -We discussed possibly adding Zometa to increase her bone density.     PLAN:  -start anastrozole in 1 month, I prescribed today -survivorship with NP Lacie in 3 months -lab and f/u in 6 months     No problem-specific Assessment & Plan notes found for this encounter.   No orders of the defined types were placed in this encounter.  All questions were answered. The patient knows to call the clinic with any problems, questions or concerns. No barriers to learning was detected. The total time spent in the appointment was 30 minutes.     Yan Feng, MD 06/29/2021   I, Katie Daubenspeck, am acting as scribe for Yan Feng, MD.   I have reviewed the above documentation for accuracy and completeness, and I agree with the above.     

## 2021-07-02 ENCOUNTER — Telehealth: Payer: Self-pay | Admitting: Hematology

## 2021-07-02 NOTE — Telephone Encounter (Signed)
Scheduled follow-up appointments per 8/19 los. Patient is aware.

## 2021-07-05 DIAGNOSIS — M25511 Pain in right shoulder: Secondary | ICD-10-CM | POA: Diagnosis not present

## 2021-07-21 DIAGNOSIS — M25511 Pain in right shoulder: Secondary | ICD-10-CM | POA: Diagnosis not present

## 2021-07-26 DIAGNOSIS — M75101 Unspecified rotator cuff tear or rupture of right shoulder, not specified as traumatic: Secondary | ICD-10-CM | POA: Diagnosis not present

## 2021-07-26 DIAGNOSIS — M25511 Pain in right shoulder: Secondary | ICD-10-CM | POA: Diagnosis not present

## 2021-08-02 ENCOUNTER — Telehealth: Payer: Self-pay

## 2021-08-02 NOTE — Telephone Encounter (Signed)
This patient called and stated that she had an MRI done of her shoulder related to the pain she was having after completing her radiation treatment.  Patients states that her MRI results were not good but she needs to know if she should continue Anastrozole or if her medication will be changing. This nurse will forward patients request to MD. No further questions or concerns at this time.

## 2021-08-03 ENCOUNTER — Ambulatory Visit: Payer: Medicare Other | Admitting: Radiation Oncology

## 2021-08-07 DIAGNOSIS — Z23 Encounter for immunization: Secondary | ICD-10-CM | POA: Diagnosis not present

## 2021-08-09 ENCOUNTER — Telehealth: Payer: Self-pay

## 2021-08-09 NOTE — Telephone Encounter (Signed)
This nurse returned call to patient and informed her that MD has received her results for the MRI to her right shoulder.  After reviewing the results MD feels that is is ok to continue taking her Anastrozole.  Patient acknowledged understanding. Reviewed upcoming appointments with patient.    No further questions or concerns to this time. Patient knows to call clinic with any further questions, concerns or problems.

## 2021-08-14 ENCOUNTER — Ambulatory Visit: Payer: Medicare Other | Attending: Orthopedic Surgery | Admitting: Physical Therapy

## 2021-08-14 ENCOUNTER — Encounter: Payer: Self-pay | Admitting: Physical Therapy

## 2021-08-14 DIAGNOSIS — G8929 Other chronic pain: Secondary | ICD-10-CM | POA: Diagnosis not present

## 2021-08-14 DIAGNOSIS — M25511 Pain in right shoulder: Secondary | ICD-10-CM | POA: Insufficient documentation

## 2021-08-14 NOTE — Therapy (Signed)
[facility] [address] [place], [address] Phone: [phone]   Fax:  [phone]  Physical Therapy Evaluation  Patient Details  Name: Lateefah Mallery MRN: [id number] Date of Birth: [birth year] No data recorded  Encounter Date: 08/14/2021   PT End of Session - 08/14/21 1752     Visit Number 1    Number of Visits 17    Date for PT Re-Evaluation 10/09/21    PT Start Time 9470    PT Stop Time 1115    PT Time Calculation (min) 35 min    Activity Tolerance Patient tolerated treatment well;Patient limited by pain    Behavior During Therapy [facility] for tasks assessed/performed             Past Medical History:  Diagnosis Date   Arthritis    shoulders and lower back per patient   Headache    Heart murmur    Hypertension    Thyroid disease     Past Surgical History:  Procedure Laterality Date   BREAST EXCISIONAL BIOPSY Left    BREAST LUMPECTOMY WITH RADIOACTIVE SEED LOCALIZATION Right 04/16/2021   Procedure: RIGHT BREAST LUMPECTOMY WITH RADIOACTIVE SEED LOCALIZATION;  Surgeon: Rolm Bookbinder, MD;  Location: [facility];  Service: General;  Laterality: Right;   CARPAL TUNNEL RELEASE Right    PILONIDAL CYST EXCISION     THYROID CYST EXCISION Right    TONSILLECTOMY     removed as a child    There were no vitals filed for this visit.    Subjective Assessment - 08/14/21 1039     Subjective Samaya Boardley is a 75  y.o. female who presents to therapy with chronic R shoulder pain. Patient reports increased pain with inability to lift her R arm to shoulder height. Patient reports increased pain with all arm movements with aggravating factors primarily with lifting and carrying. Patient demonstrates decreased ability to perform ADLs such as reaching to perform, self-care, preparing food, driving, and reaching for objects on a shelf at shoulder height. Pt reports using sub zero cream, massage cream, and meloxicam  for pain relief. Pt enjoys volunteering for support groups and telephone/virtual outreach. Pt denies any unexplained weight fluctuation, saddle paresthesia, loss of bowel/bladder function, or unrelenting night pain at this time.  Pt has had MRI imaging confirming R RC tear of supraspinatus and infraspinatus muscles/tendons. Patient has a PMH breast cancer with last radiation tx on 06/28/21.    Pertinent History Edlyn Rosenburg is a 75  y.o. female who presents to therapy with chronic R shoulder pain. Patient reports increased pain with inability to lift her R arm to shoulder height. Patient reports increased pain with all arm movements with aggravating factors primarily with lifting and carrying. Patient demonstrates decreased ability to perform ADLs such as reaching to perform, self-care, preparing food, driving, and reaching for objects on a shelf at shoulder height. Pt reports using sub zero cream, massage cream, and meloxicam for pain relief. Pt enjoys volunteering for support groups and telephone/virtual outreach. Pt denies any unexplained weight fluctuation, saddle paresthesia, loss of bowel/bladder function, or unrelenting night pain at this time.  Pt has had MRI imaging confirming R RC tear of supraspinatus and infraspinatus muscles/tendons. Patient has a PMH breast cancer with last radiation tx on 06/28/21.    Limitations Lifting;House hold activities    How long can you sit comfortably? na    How long can you stand comfortably? na    How long  can you walk comfortably? na    Diagnostic tests MRI    Patient Stated Goals prevent surgery, increase motion, and decrease pain.    Currently in Pain? Yes    Pain Score 7     Pain Location Shoulder    Pain Orientation Right    Pain Descriptors / Indicators Sharp    Pain Type Chronic pain    Pain Onset More than a month ago    Pain Frequency Intermittent    Aggravating Factors  lifting over head, hair care, food prep    Pain Relieving Factors sub zero,  massage, meloxicam    Effect of Pain on Daily Activities limits ADL              Cervical ROM L/R  WFL  Shoulder AROM L/R   Flexion: 95/ 75deg  Abduction:110/90 deg ER: c7/ c4  IR: T7/t10    Shoulder Strength L/R Flexion: 3/5; .-3/5 Abduction:3/5; 2+/5 IR:4/5; 3+5 ER: 3+/5; 2+/5 *MMTs in available range   Special Tests:  Subacromial Impingement (cluster) *Hawkin's Kennedy (+) *Neer's (-)  Rotator Cuff  Full Tear cluster Drop Arm Test (+) Painful Arc (Pain from 60 to 120 degrees scaption): (+) Infraspinatus Muscle Test (+) Empty/Full Can supraspinatus tear/ tendon dysfunction (+)   UE Sensation: appears intact bilaterally   Posture: Forward head, rounder shoulders, increased thoracic kyphosis   Palpation: Increase TTP R upper trap and pec minor  Observation: scapular dyskinesis with attempt of UE elevation, with shoulder hiking compensatory strategies present. Utilizes LUE to aid in lowering of RUE in small available range  Objective measurements completed on examination: See above findings.      Ther-Ex PT reviewed the following HEP with patient with patient able to demonstrate a set of the following with min cuing for correction needed. PT educated patient on parameters of therex (how/when to inc/decrease intensity, frequency, rep/set range, stretch hold time, and purpose of therex) with verbalized understanding.    Table slides shoulder FLEX 12-15  Table slides shoulder ABD 12-15 reps  3x/day 4-5 day per week     PT Education - 08/14/21 1743     Education Details Patient was educated on diagnosis, anatomy and pathology involved, prognosis, role of PT, and was given an HEP, demonstrating exercise with proper form following verbal and tactile cues, and was given a paper hand out to continue exercise at home. Pt was educated on and agreed to plan of care.    Person(s) Educated Patient    Methods Explanation;Demonstration    Comprehension Verbalized  understanding;Returned demonstration              PT Short Term Goals - 08/14/21 1732       PT SHORT TERM GOAL #1   Title Pt will demonstrate independence with HEP to improve R shoulder function for increased ability to participate with ADLs    Baseline HEP given    Time 3    Period Weeks    Status New    Target Date 09/04/21               PT Long Term Goals - 08/14/21 1733       PT LONG TERM GOAL #1   Title Patient will increase FOTO score to ** to demonstrate predicted increase in functional mobility to complete ADLs    Baseline 08/14/21:    Time 8    Period Weeks    Status New    Target Date 10/09/21  PT LONG TERM GOAL #2   Title Patient will improve R shoulder flex/abd AROM to 110 degrees in sitting without pain greater than 5/10 on NPRS.    Baseline 08/14/21: Flexion: L95/ R 75deg ABD: Abduction:L110/ R90 deg    Time 8    Period Weeks    Status New    Target Date 10/09/21      PT LONG TERM GOAL #3   Title Patient will improve R shoulder strength to 4-/5 or  for flexion, abd, and internal rotation in order to improve ability to perform self care and light household chores.    Baseline 08/14/21: Flexion: -3/5, Abduction: 2+/5, IR:3+5    Time 8    Period Weeks    Status New                    Plan - 08/14/21 1745     Clinical Impression Statement Pt is a 75 y.o female presenting today with R shoulder pain and MRI finding of supraspinatus and infraspinatus tear. Patient presents with impairments with decreased shoulder/periscapular strength, AROM, PROM, postural dysfunction, and pain. Activity limitations with lifting, carrying, pushing, pulling, and sleeping prohibiting participation in completing ADLs dressing, self-care, household chores and volunteer activities. Patient will benefit from skilled PT to address these impairments to improve functional mobility.    Personal Factors and Comorbidities Age;Time since onset of  injury/illness/exacerbation;Comorbidity 2;Comorbidity 1    Comorbidities BC with radiation tx    Examination-Activity Limitations Bathing;Dressing;Hygiene/Grooming;Lift;Carry;Reach Overhead    Examination-Participation Restrictions Volunteer;Community Activity;Cleaning;Laundry;Meal Prep;Driving;Shop    Stability/Clinical Decision Making Stable/Uncomplicated    Clinical Decision Making Low    Rehab Potential Fair    PT Frequency 2x / week    PT Duration 8 weeks    PT Treatment/Interventions ADLs/Self Care Home Management;Cryotherapy;Electrical Stimulation;Iontophoresis 4mg /ml Dexamethasone;Traction;Therapeutic exercise;Neuromuscular re-education;Therapeutic activities;Patient/family education;Manual techniques;Passive range of motion;Dry needling;Energy conservation;Splinting;Taping    PT Next Visit Plan HEP updates    PT Home Exercise Plan table slides    Consulted and Agree with Plan of Care Patient             Patient will benefit from skilled therapeutic intervention in order to improve the following deficits and impairments:  Decreased mobility, Pain, Postural dysfunction, Impaired UE functional use, Decreased strength, Decreased activity tolerance, Improper body mechanics, Decreased range of motion, Decreased endurance  Visit Diagnosis: Chronic right shoulder pain     Problem List Patient Active Problem List   Diagnosis Date Noted   Malignant neoplasm of lower-outer quadrant of right breast of female, estrogen receptor positive ([place]) 05/06/2021   Paresthesia 04/24/2017   Bilateral shoulder pain 07/25/2016   Neck pain 07/25/2016     Durwin Reges DPT Sharion Settler, SPT  Sharion Settler, Student-PT 08/14/2021, 5:55 PM  [facility] [address], [place], [address] Phone: [phone]   Fax:  [phone]  Name: Lauramae Kneisley MRN: [id number] Date of Birth: [birth year]

## 2021-08-16 ENCOUNTER — Ambulatory Visit: Payer: Medicare Other | Admitting: Physical Therapy

## 2021-08-16 ENCOUNTER — Ambulatory Visit
Admission: RE | Admit: 2021-08-16 | Discharge: 2021-08-16 | Disposition: A | Discharge status: Home / Self Care | Payer: Medicare Other | Source: Ambulatory Visit | Attending: Radiation Oncology | Admitting: Radiation Oncology

## 2021-08-16 ENCOUNTER — Encounter: Payer: Self-pay | Admitting: Radiation Oncology

## 2021-08-16 ENCOUNTER — Encounter: Payer: Self-pay | Admitting: Physical Therapy

## 2021-08-16 VITALS — BP 128/88 | HR 71 | Temp 96.4°F | Resp 16 | Wt 140.5 lb

## 2021-08-16 DIAGNOSIS — M25511 Pain in right shoulder: Secondary | ICD-10-CM | POA: Diagnosis not present

## 2021-08-16 DIAGNOSIS — C50511 Malignant neoplasm of lower-outer quadrant of right female breast: Secondary | ICD-10-CM

## 2021-08-16 DIAGNOSIS — Z170 Estrogen receptor positive status [ER+]: Secondary | ICD-10-CM | POA: Diagnosis not present

## 2021-08-16 DIAGNOSIS — G8929 Other chronic pain: Secondary | ICD-10-CM | POA: Diagnosis not present

## 2021-08-16 DIAGNOSIS — Z79811 Long term (current) use of aromatase inhibitors: Secondary | ICD-10-CM | POA: Diagnosis not present

## 2021-08-16 DIAGNOSIS — Z923 Personal history of irradiation: Secondary | ICD-10-CM | POA: Insufficient documentation

## 2021-08-16 NOTE — Therapy (Signed)
[facility] [address] [place], [address] Phone: [phone]   Fax:  [phone]  Physical Therapy Treatment  Patient Details  Name: Tracy Horne MRN: [id number] Date of Birth: [birth year] No data recorded  Encounter Date: 08/16/2021   PT End of Session - 08/16/21 1533     Visit Number 2    Number of Visits 17    Date for PT Re-Evaluation 10/09/21    PT Start Time 1347    PT Stop Time 1429    PT Time Calculation (min) 42 min    Activity Tolerance Patient tolerated treatment well;Patient limited by pain    Behavior During Therapy [facility] for tasks assessed/performed             Past Medical History:  Diagnosis Date   Arthritis    shoulders and lower back per patient   Headache    Heart murmur    Hypertension    Thyroid disease     Past Surgical History:  Procedure Laterality Date   BREAST EXCISIONAL BIOPSY Left    BREAST LUMPECTOMY WITH RADIOACTIVE SEED LOCALIZATION Right 04/16/2021   Procedure: RIGHT BREAST LUMPECTOMY WITH RADIOACTIVE SEED LOCALIZATION;  Surgeon: Rolm Bookbinder, MD;  Location: [facility];  Service: General;  Laterality: Right;   CARPAL TUNNEL RELEASE Right    PILONIDAL CYST EXCISION     THYROID CYST EXCISION Right    TONSILLECTOMY     removed as a child    There were no vitals filed for this visit.   Subjective Assessment - 08/16/21 1354     Subjective Pt reports that she has activite participation in her HEP. She reports            Therex  Table slides FLEX x 15 reps Table slides ABD x 15 reps  Stair railing slides FLEX x 15 reps Stair railing slides ABD x 15 reps   MMT R/L shoulder  I  2+/3 Y 2+/3 T 3/3   TRX Strap raise in R shoulder scaption within available range 1 x 6 reps with increased pain  Scapular retractions 2 x 10 reps with 5 sec holds  Isometrics Flex 10 reps x 5 sec holds Abd 10 reps x 5 sec holds Ext  10 reps x 5 sec  holds           PT Short Term Goals - 08/14/21 1732       PT SHORT TERM GOAL #1   Title Pt will demonstrate independence with HEP to improve R shoulder function for increased ability to participate with ADLs    Baseline HEP given    Time 3    Period Weeks    Status New    Target Date 09/04/21               PT Long Term Goals - 08/14/21 1733       PT LONG TERM GOAL #1   Title Patient will increase FOTO score to ** to demonstrate predicted increase in functional mobility to complete ADLs    Baseline 08/14/21:    Time 8    Period Weeks    Status New    Target Date 10/09/21      PT LONG TERM GOAL #2   Title Patient will improve R shoulder flex/abd AROM to 110 degrees in sitting without pain greater than 5/10 on NPRS.    Baseline 08/14/21: Flexion: L95/ R 75deg ABD: Abduction:L110/ R90 deg  Time 8    Period Weeks    Status New    Target Date 10/09/21      PT LONG TERM GOAL #3   Title Patient will improve R shoulder strength to 4/5 or  for abd and flexion within availible range in order to improve ability to perform self care and light household chores, utilizing compensatory musculature    Baseline 08/14/21: Flexion: -3/5, Abduction: 2+/5,    Time 8    Period Weeks    Status New                   Plan - 08/16/21 1401     Clinical Impression Statement PT initiated R shoulder strengthening and mobility this session. Pt session limited by R shoulder pain with flexion isometric and limited AROM exercises. Pt did report decrease in R shoulder pain following therex. Plan next session to address R shoulder mobility with manual therapy, AAROM, and periscapular strengthening as able. Patient will continue to benefit from skilled therapy for exercise performance to ensure proper technique for safe and effective progression.    Personal Factors and Comorbidities Age;Time since onset of injury/illness/exacerbation;Comorbidity 2;Comorbidity 1;Sex;Past/Current  Experience    Comorbidities BC with radiation tx    Examination-Activity Limitations Bathing;Dressing;Hygiene/Grooming;Lift;Carry;Reach Overhead;Sleep    Examination-Participation Restrictions Volunteer;Community Activity;Cleaning;Laundry;Meal Prep;Driving;Shop    Stability/Clinical Decision Making Evolving/Moderate complexity    Clinical Decision Making Moderate    Rehab Potential Fair    PT Frequency 2x / week    PT Duration 8 weeks    PT Treatment/Interventions ADLs/Self Care Home Management;Cryotherapy;Electrical Stimulation;Iontophoresis 4mg /ml Dexamethasone;Traction;Therapeutic exercise;Neuromuscular re-education;Therapeutic activities;Patient/family education;Manual techniques;Passive range of motion;Dry needling;Energy conservation;Splinting;Taping;Spinal Manipulations    PT Next Visit Plan manual therapy, AAROM, and periscapular strengthening    PT Home Exercise Plan table slides, stair slides, scap retraction    Consulted and Agree with Plan of Care Patient             Patient will benefit from skilled therapeutic intervention in order to improve the following deficits and impairments:  Decreased mobility, Pain, Postural dysfunction, Impaired UE functional use, Decreased strength, Decreased activity tolerance, Improper body mechanics, Decreased range of motion, Decreased endurance  Visit Diagnosis: Chronic right shoulder pain     Problem List Patient Active Problem List   Diagnosis Date Noted   Malignant neoplasm of lower-outer quadrant of right breast of female, estrogen receptor positive ([place]) 05/06/2021   Paresthesia 04/24/2017   Bilateral shoulder pain 07/25/2016   Neck pain 07/25/2016    Durwin Reges DPT Sharion Settler, SPT  Durwin Reges, PT 08/16/2021, 5:07 PM  [facility] [facility] [address] [place], [address] Phone: [phone]   Fax:  [phone]  Name: Tracy Horne MRN:  [id number] Date of Birth: 10/30/1946

## 2021-08-16 NOTE — Progress Notes (Signed)
Radiation Oncology Follow up Note  Name: Tracy Horne   Date:   08/16/2021 MRN:  [id number] DOB: December 25, 1945    This 75 y.o. 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  REFERRING PROVIDER: Deland Pretty, MD  HPI: Patient is a 75 year old female now at 1 month having completed whole breast radiation to her right breast for stage Ia invasive mammary carcinoma ER positive seen today in routine follow-up she is doing well.  Specifically denies breast tenderness cough or bone pain..  She has been started on Arimidex.  She does have problems with tendons in her right shoulder which is being treated with physical therapy at this time.  COMPLICATIONS OF TREATMENT: none  FOLLOW UP COMPLIANCE: keeps appointments   PHYSICAL EXAM:  BP 128/88 (BP Location: Left Arm, Patient Position: Sitting)   Pulse 71   Temp (!) 96.4 F (35.8 C) (Tympanic)   Resp 16   Wt 140 lb 8 oz (63.7 kg)   BMI 26.55 kg/m  Lungs are clear to A&P cardiac examination essentially unremarkable with regular rate and rhythm. No dominant mass or nodularity is noted in either breast in 2 positions examined. Incision is well-healed. No axillary or supraclavicular adenopathy is appreciated. Cosmetic result is excellent.  Well-developed well-nourished patient in NAD. HEENT reveals PERLA, EOMI, discs not visualized.  Oral cavity is clear. No oral mucosal lesions are identified. Neck is clear without evidence of cervical or supraclavicular adenopathy. Lungs are clear to A&P. Cardiac examination is essentially unremarkable with regular rate and rhythm without murmur rub or thrill. Abdomen is benign with no organomegaly or masses noted. Motor sensory and DTR levels are equal and symmetric in the upper and lower extremities. Cranial nerves II through XII are grossly intact. Proprioception is intact. No peripheral  adenopathy or edema is identified. No motor or sensory levels are noted. Crude visual fields are within normal range.  RADIOLOGY RESULTS: No current films to review  PLAN: Present time patient is doing well 1 month out from whole breast radiation and pleased with her overall progress.  Of asked to see her back in 4 to 5 months for follow-up.  She continues on Arimidex without side effect.  Patient knows to call with any concerns.  I would like to take this opportunity to thank you for allowing me to participate in the care of your patient.Noreene Filbert, MD

## 2021-08-21 ENCOUNTER — Ambulatory Visit: Payer: Medicare Other | Admitting: Physical Therapy

## 2021-08-21 ENCOUNTER — Encounter: Payer: Self-pay | Admitting: Physical Therapy

## 2021-08-21 DIAGNOSIS — G8929 Other chronic pain: Secondary | ICD-10-CM | POA: Diagnosis not present

## 2021-08-21 DIAGNOSIS — M25511 Pain in right shoulder: Secondary | ICD-10-CM | POA: Diagnosis not present

## 2021-08-21 NOTE — Therapy (Signed)
[facility] [address] [place], [address] Phone: [phone]   Fax:  [phone]  Physical Therapy Treatment  Patient Details  Name: Tracy Horne MRN: [id number] Date of Birth: 05-Feb-1946 No data recorded  Encounter Date: 08/21/2021     Past Medical History:  Diagnosis Date   Arthritis    shoulders and lower back per patient   Headache    Heart murmur    Hypertension    Thyroid disease     Past Surgical History:  Procedure Laterality Date   BREAST EXCISIONAL BIOPSY Left    BREAST LUMPECTOMY WITH RADIOACTIVE SEED LOCALIZATION Right 04/16/2021   Procedure: RIGHT BREAST LUMPECTOMY WITH RADIOACTIVE SEED LOCALIZATION;  Surgeon: Rolm Bookbinder, MD;  Location: [facility];  Service: General;  Laterality: Right;   CARPAL TUNNEL RELEASE Right    PILONIDAL CYST EXCISION     THYROID CYST EXCISION Right    TONSILLECTOMY     removed as a child    There were no vitals filed for this visit.     Therex   Pulleys x 15 reps flex x 3 sec hold Pulleys x 15 reps abd x 3 sec hold   Standing shoulder Abd x 10 reps BUE   Standing Tband Row YTB 2 x 10 reps with emphasis on scapular retraction   UE Ranger shoulder scaption to 90 deg x 10 reps and approx 110 x 10 reps   Pendulum swings 2 x 10 reps for decreased pain  BUE push from armrest (lat/triceps) 2 x 10 reps with emphasis on eccentric lower; pt required LE use to lift off               PT Short Term Goals - 08/14/21 1732       PT SHORT TERM GOAL #1   Title Pt will demonstrate independence with HEP to improve R shoulder function for increased ability to participate with ADLs    Baseline HEP given    Time 3    Period Weeks    Status New    Target Date 09/04/21               PT Long Term Goals - 08/14/21 1733       PT LONG TERM GOAL #1   Title Patient will increase FOTO score to ** to demonstrate predicted increase in  functional mobility to complete ADLs    Baseline 08/14/21:    Time 8    Period Weeks    Status New    Target Date 10/09/21      PT LONG TERM GOAL #2   Title Patient will improve R shoulder flex/abd AROM to 110 degrees in sitting without pain greater than 5/10 on NPRS.    Baseline 08/14/21: Flexion: L95/ R 75deg ABD: Abduction:L110/ R90 deg    Time 8    Period Weeks    Status New    Target Date 10/09/21      PT LONG TERM GOAL #3   Title Patient will improve R shoulder strength to 4/5 or  for abd and flexion within availible range in order to improve ability to perform self care and light household chores, utilizing compensatory musculature    Baseline 08/14/21: Flexion: -3/5, Abduction: 2+/5,    Time 8    Period Weeks    Status New                     Patient will  benefit from skilled therapeutic intervention in order to improve the following deficits and impairments:  Decreased mobility, Pain, Postural dysfunction, Impaired UE functional use, Decreased strength, Decreased activity tolerance, Improper body mechanics, Decreased range of motion, Decreased endurance  Visit Diagnosis: Chronic right shoulder pain     Problem List Patient Active Problem List   Diagnosis Date Noted   Malignant neoplasm of lower-outer quadrant of right breast of female, estrogen receptor positive ([place]) 05/06/2021   Paresthesia 04/24/2017   Bilateral shoulder pain 07/25/2016   Neck pain 07/25/2016     Durwin Reges DPT Sharion Settler, SPT  Durwin Reges, PT 08/26/2021, 9:20 PM  [facility] [address] [place], [address] Phone: [phone]   Fax:  [phone]  Name: Tristan Bramble MRN: [id number] Date of Birth: 03-16-1946

## 2021-08-23 ENCOUNTER — Ambulatory Visit: Payer: Medicare Other | Admitting: Physical Therapy

## 2021-08-23 ENCOUNTER — Encounter: Payer: Self-pay | Admitting: Physical Therapy

## 2021-08-23 DIAGNOSIS — G8929 Other chronic pain: Secondary | ICD-10-CM | POA: Diagnosis not present

## 2021-08-23 DIAGNOSIS — M25511 Pain in right shoulder: Secondary | ICD-10-CM | POA: Diagnosis not present

## 2021-08-23 NOTE — Therapy (Signed)
[facility] [address], [place], [address] Phone: [phone]   Fax:  [phone]  Physical Therapy Treatment  Patient Details  Name: Tracy Horne MRN: [id number] Date of Birth: Mar 08, 1946 No data recorded  Encounter Date: 08/23/2021   PT End of Session - 08/23/21 0954     Visit Number 4    Number of Visits 17    Date for PT Re-Evaluation 10/09/21    PT Start Time 0946    PT Stop Time 1025    PT Time Calculation (min) 39 min    Activity Tolerance Patient tolerated treatment well    Behavior During Therapy [facility] for tasks assessed/performed             Past Medical History:  Diagnosis Date   Arthritis    shoulders and lower back per patient   Headache    Heart murmur    Hypertension    Thyroid disease     Past Surgical History:  Procedure Laterality Date   BREAST EXCISIONAL BIOPSY Left    BREAST LUMPECTOMY WITH RADIOACTIVE SEED LOCALIZATION Right 04/16/2021   Procedure: RIGHT BREAST LUMPECTOMY WITH RADIOACTIVE SEED LOCALIZATION;  Surgeon: Rolm Bookbinder, MD;  Location: [facility];  Service: General;  Laterality: Right;   CARPAL TUNNEL RELEASE Right    PILONIDAL CYST EXCISION     THYROID CYST EXCISION Right    TONSILLECTOMY     removed as a child    There were no vitals filed for this visit.   Subjective Assessment - 08/23/21 0950     Subjective She reports her R shoulder pain is 4/10 on NPRS. Pt reports active HEP participation and feels that she can achieve greater ROM with her R shoulder and can comb her hair and reach into the microwave.    Pertinent History Tracy Horne is a 75  y.o. female who presents to therapy with chronic R shoulder pain for several years. Reports pain has worsened following R breast radiation 05/28/21 - 06/28/21. Patient with history of R breast cancer since Oct 2021, with progression May 2022, lumpectomy completed 04/16/21, followed by radiation and aromatase  inhibitor hormone therapy for next 5 years (no chemotherapy). Pt with MRI Sept 12 2022 indicating full thickness tear of supinatus with tendon recoil, near full thickness tear of infraspinatus, and full thickness tear of superior half of subscap, making her a good candidate for reverse total shoulder- pt indicates she would like to put off surgery at this time for as long as function allows.  Patient reports increased pain with inability to lift her R arm to shoulder height. Patient reports increased pain with all arm movements with aggravating factors primarily with lifting and carrying. Patient demonstrates decreased ability to perform ADLs such as reaching to perform, self-care, preparing food, driving, and reaching for objects on a shelf at shoulder height. Pt reports using sub zero cream, massage cream, and meloxicam for pain relief. Pt enjoys volunteering for support groups and telephone/virtual outreach. Pt denies any unexplained weight fluctuation, saddle paresthesia, loss of bowel/bladder function, or unrelenting night pain at this time.  Pt has had MRI imaging confirming R RC tear of supraspinatus and infraspinatus muscles/tendons. Patient has a PMH breast cancer with last radiation tx on 06/28/21.    Limitations Lifting;House hold activities    How long can you sit comfortably? na    How long can you stand comfortably? na    How long can you walk comfortably? na  Diagnostic tests MRI sept 12 full thickness tear of supinatus with tendon recoil, near full thickness tear of infraspinatus, full thickness tear of superior half of subscap, severe tendoinosis and interstitial tearing of prox bicep tendon medially subluxed    Patient Stated Goals prevent surgery, increase motion, and decrease pain.              Therex    Pulleys x 15 reps flex x 3 sec hold Pulleys x 15 reps abd x 3 sec hold    Snow Angles on wall within available range x 10 reps BUE    UE Ranger shoulder scaption reps and  approx 110 x 10 reps with   AAROM flex with PVC approx (150-60) degrees supine x 15 reps   Tband Row YTB 3 x 10 reps with emphasis on scap retraction   Push up plus on wall x 10 reps   Pendulum swings 2 x 10  throughout session for decreased pain           PT Education - 08/23/21 0953     Education Details therex form/technique    Person(s) Educated Patient    Methods Explanation;Demonstration    Comprehension Verbalized understanding;Returned demonstration              PT Short Term Goals - 08/14/21 1732       PT SHORT TERM GOAL #1   Title Pt will demonstrate independence with HEP to improve R shoulder function for increased ability to participate with ADLs    Baseline HEP given    Time 3    Period Weeks    Status New    Target Date 09/04/21               PT Long Term Goals - 08/14/21 1733       PT LONG TERM GOAL #1   Title Patient will increase FOTO score to ** to demonstrate predicted increase in functional mobility to complete ADLs    Baseline 08/14/21:    Time 8    Period Weeks    Status New    Target Date 10/09/21      PT LONG TERM GOAL #2   Title Patient will improve R shoulder flex/abd AROM to 110 degrees in sitting without pain greater than 5/10 on NPRS.    Baseline 08/14/21: Flexion: L95/ R 75deg ABD: Abduction:L110/ R90 deg    Time 8    Period Weeks    Status New    Target Date 10/09/21      PT LONG TERM GOAL #3   Title Patient will improve R shoulder strength to 4/5 or  for abd and flexion within availible range in order to improve ability to perform self care and light household chores, utilizing compensatory musculature    Baseline 08/14/21: Flexion: -3/5, Abduction: 2+/5,    Time 8    Period Weeks    Status New                   Plan - 08/23/21 1030     Clinical Impression Statement Pt tolerated session well evidenced by reports of decrease in R shoulder pain. Pt also reported increase in AROM. PT continued  UE/periscapular strengthening this session utilizing AAROM/AROM, light resistance, and WB activities. PT focused on UE strengthening and stability and ROM within tolerance. Patient will continue to benefit from skilled therapy for exercise performance to ensure proper technique for safe and effective progression. Continue PT POC as able.  Personal Factors and Comorbidities Age;Time since onset of injury/illness/exacerbation;Comorbidity 2;Comorbidity 1;Sex;Past/Current Experience    Comorbidities BC with radiation tx    Examination-Activity Limitations Bathing;Dressing;Hygiene/Grooming;Lift;Carry;Reach Overhead;Sleep    Examination-Participation Restrictions Volunteer;Community Activity;Cleaning;Laundry;Meal Prep;Driving;Shop    Stability/Clinical Decision Making Evolving/Moderate complexity    Clinical Decision Making Moderate    Rehab Potential Fair    PT Frequency 2x / week    PT Duration 8 weeks    PT Treatment/Interventions ADLs/Self Care Home Management;Cryotherapy;Electrical Stimulation;Iontophoresis 4mg /ml Dexamethasone;Traction;Therapeutic exercise;Neuromuscular re-education;Therapeutic activities;Patient/family education;Manual techniques;Passive range of motion;Dry needling;Energy conservation;Splinting;Taping;Spinal Manipulations    PT Next Visit Plan manual therapy, AAROM, and periscapular strengthening    PT Home Exercise Plan table slides, stair slides, scap retraction    Consulted and Agree with Plan of Care Patient             Patient will benefit from skilled therapeutic intervention in order to improve the following deficits and impairments:  Decreased mobility, Pain, Postural dysfunction, Impaired UE functional use, Decreased strength, Decreased activity tolerance, Improper body mechanics, Decreased range of motion, Decreased endurance  Visit Diagnosis: Chronic right shoulder pain     Problem List Patient Active Problem List   Diagnosis Date Noted   Malignant  neoplasm of lower-outer quadrant of right breast of female, estrogen receptor positive ([place]) 05/06/2021   Paresthesia 04/24/2017   Bilateral shoulder pain 07/25/2016   Neck pain 07/25/2016     Durwin Reges DPT Sharion Settler, SPT  Durwin Reges, PT 08/24/2021, 9:22 AM  [facility] [address], [place], [address] Phone: [phone]   Fax:  [phone]  Name: Jenniger Figiel MRN: [id number] Date of Birth: [birth year]

## 2021-08-28 ENCOUNTER — Ambulatory Visit: Payer: Medicare Other | Admitting: Physical Therapy

## 2021-08-28 ENCOUNTER — Encounter: Payer: Self-pay | Admitting: Physical Therapy

## 2021-08-28 DIAGNOSIS — G8929 Other chronic pain: Secondary | ICD-10-CM | POA: Diagnosis not present

## 2021-08-28 DIAGNOSIS — M25511 Pain in right shoulder: Secondary | ICD-10-CM

## 2021-08-28 NOTE — Therapy (Signed)
[facility] [address], [place], [address] Phone: [phone]   Fax:  [phone]  Physical Therapy Treatment  Patient Details  Name: Tracy Horne MRN: [id number] Date of Birth: 06/23/1946 No data recorded  Encounter Date: 08/28/2021   PT End of Session - 08/28/21 1019     Visit Number 5    Number of Visits 17    Date for PT Re-Evaluation 10/09/21    PT Start Time 0948    PT Stop Time 1026    PT Time Calculation (min) 38 min    Activity Tolerance Patient tolerated treatment well    Behavior During Therapy [facility] for tasks assessed/performed             Past Medical History:  Diagnosis Date   Arthritis    shoulders and lower back per patient   Headache    Heart murmur    Hypertension    Thyroid disease     Past Surgical History:  Procedure Laterality Date   BREAST EXCISIONAL BIOPSY Left    BREAST LUMPECTOMY WITH RADIOACTIVE SEED LOCALIZATION Right 04/16/2021   Procedure: RIGHT BREAST LUMPECTOMY WITH RADIOACTIVE SEED LOCALIZATION;  Surgeon: Rolm Bookbinder, MD;  Location: [facility];  Service: General;  Laterality: Right;   CARPAL TUNNEL RELEASE Right    PILONIDAL CYST EXCISION     THYROID CYST EXCISION Right    TONSILLECTOMY     removed as a child    There were no vitals filed for this visit.   Subjective Assessment - 08/28/21 0953     Subjective Pt reports improvement in shoulder pain and able to sleep and lay down with less pain. She also reports being able to reach higher than before. She reports 4/10 pain on NPRS this visit.    Pertinent History Tracy Horne is a 75  y.o. female who presents to therapy with chronic R shoulder pain for several years. Reports pain has worsened following R breast radiation 05/28/21 - 06/28/21. Patient with history of R breast cancer since Oct 2021, with progression May 2022, lumpectomy completed 04/16/21, followed by radiation and aromatase inhibitor  hormone therapy for next 5 years (no chemotherapy). Pt with MRI Sept 12 2022 indicating full thickness tear of supinatus with tendon recoil, near full thickness tear of infraspinatus, and full thickness tear of superior half of subscap, making her a good candidate for reverse total shoulder- pt indicates she would like to put off surgery at this time for as long as function allows.  Patient reports increased pain with inability to lift her R arm to shoulder height. Patient reports increased pain with all arm movements with aggravating factors primarily with lifting and carrying. Patient demonstrates decreased ability to perform ADLs such as reaching to perform, self-care, preparing food, driving, and reaching for objects on a shelf at shoulder height. Pt reports using sub zero cream, massage cream, and meloxicam for pain relief. Pt enjoys volunteering for support groups and telephone/virtual outreach. Pt denies any unexplained weight fluctuation, saddle paresthesia, loss of bowel/bladder function, or unrelenting night pain at this time.  Pt has had MRI imaging confirming R RC tear of supraspinatus and infraspinatus muscles/tendons. Patient has a PMH breast cancer with last radiation tx on 06/28/21.    Limitations Lifting;House hold activities    How long can you sit comfortably? na    How long can you stand comfortably? na    How long can you walk comfortably? na  Diagnostic tests MRI sept 12 full thickness tear of supinatus with tendon recoil, near full thickness tear of infraspinatus, full thickness tear of superior half of subscap, severe tendoinosis and interstitial tearing of prox bicep tendon medially subluxed    Patient Stated Goals prevent surgery, increase motion, and decrease pain.             Therex    Pulleys x 15 reps flex x 3 sec hold Pulleys x 15 reps abd x 3 sec hold    Seated Shoulder ABD on wall within available range x 10 reps BUE   Physioball overhead rolls R shoulder flex  against wall 2 x 6 reps with comfortable range between (50-115 deg) flex;    Shoulder Ext @ 45 deg ABD YTB 3 x 10 reps with emphasis on scap retraction    BUE push from armrest (lat/triceps) 2 x 6 reps with emphasis on eccentric lower; pt required LE use to lift off   AAROM flex with PVC seated 1 x 8 reps                              PT Education - 08/28/21 0952     Education Details therex form/technique    Person(s) Educated Patient    Methods Explanation;Demonstration    Comprehension Verbalized understanding;Returned demonstration              PT Short Term Goals - 08/14/21 1732       PT SHORT TERM GOAL #1   Title Pt will demonstrate independence with HEP to improve R shoulder function for increased ability to participate with ADLs    Baseline HEP given    Time 3    Period Weeks    Status New    Target Date 09/04/21               PT Long Term Goals - 08/14/21 1733       PT LONG TERM GOAL #1   Title Patient will increase FOTO score to ** to demonstrate predicted increase in functional mobility to complete ADLs    Baseline 08/14/21:    Time 8    Period Weeks    Status New    Target Date 10/09/21      PT LONG TERM GOAL #2   Title Patient will improve R shoulder flex/abd AROM to 110 degrees in sitting without pain greater than 5/10 on NPRS.    Baseline 08/14/21: Flexion: L95/ R 75deg ABD: Abduction:L110/ R90 deg    Time 8    Period Weeks    Status New    Target Date 10/09/21      PT LONG TERM GOAL #3   Title Patient will improve R shoulder strength to 4/5 or  for abd and flexion within availible range in order to improve ability to perform self care and light household chores, utilizing compensatory musculature    Baseline 08/14/21: Flexion: -3/5, Abduction: 2+/5,    Time 8    Period Weeks    Status New                   Plan - 08/28/21 1024     Clinical Impression Statement PT continued UE/periscapular strengthening  this session utilizing AAROM/AROM and light resistance. PT focused on UE strengthening and stability and ROM within pain tolerance. Pt tolerated session well evidenced by reports of decrease in R shoulder pain. Pt continues to report increase  in AROM and decrease in pain. Patient will continue to benefit from skilled therapy for exercise performance to ensure proper technique for safe and effective progression. Continue PT POC as able.    Personal Factors and Comorbidities Age;Time since onset of injury/illness/exacerbation;Comorbidity 2;Comorbidity 1;Sex;Past/Current Experience    Comorbidities BC with radiation tx    Examination-Activity Limitations Bathing;Dressing;Hygiene/Grooming;Lift;Carry;Reach Overhead;Sleep    Examination-Participation Restrictions Volunteer;Community Activity;Cleaning;Laundry;Meal Prep;Driving;Shop    Stability/Clinical Decision Making Evolving/Moderate complexity    Clinical Decision Making Moderate    Rehab Potential Fair    PT Frequency 2x / week    PT Duration 8 weeks    PT Treatment/Interventions ADLs/Self Care Home Management;Cryotherapy;Electrical Stimulation;Iontophoresis 4mg /ml Dexamethasone;Traction;Therapeutic exercise;Neuromuscular re-education;Therapeutic activities;Patient/family education;Manual techniques;Passive range of motion;Dry needling;Energy conservation;Splinting;Taping;Spinal Manipulations    PT Next Visit Plan manual therapy, AAROM, and periscapular strengthening    PT Home Exercise Plan table slides, stair slides, scap retraction    Consulted and Agree with Plan of Care Patient             Patient will benefit from skilled therapeutic intervention in order to improve the following deficits and impairments:  Decreased mobility, Pain, Postural dysfunction, Impaired UE functional use, Decreased strength, Decreased activity tolerance, Improper body mechanics, Decreased range of motion, Decreased endurance  Visit Diagnosis: Chronic right  shoulder pain     Problem List Patient Active Problem List   Diagnosis Date Noted   Malignant neoplasm of lower-outer quadrant of right breast of female, estrogen receptor positive ([place]) 05/06/2021   Paresthesia 04/24/2017   Bilateral shoulder pain 07/25/2016   Neck pain 07/25/2016     Durwin Reges DPT Sharion Settler, SPT  Durwin Reges, PT 08/29/2021, 10:21 AM  [facility] [address] [place], [address] Phone: [phone]   Fax:  [phone]  Name: Tracy Horne MRN: [id number] Date of Birth: 1946-09-10

## 2021-08-30 ENCOUNTER — Encounter: Payer: Self-pay | Admitting: Physical Therapy

## 2021-08-30 ENCOUNTER — Ambulatory Visit: Payer: Medicare Other | Admitting: Physical Therapy

## 2021-08-30 DIAGNOSIS — G8929 Other chronic pain: Secondary | ICD-10-CM | POA: Diagnosis not present

## 2021-08-30 DIAGNOSIS — M25511 Pain in right shoulder: Secondary | ICD-10-CM | POA: Diagnosis not present

## 2021-08-30 NOTE — Therapy (Signed)
[facility] [address], [place], [address] Phone: [phone]   Fax:  [phone]  Physical Therapy Treatment  Patient Details  Name: Tracy Horne MRN: [id number] Date of Birth: 04/12/1946 No data recorded  Encounter Date: 08/30/2021   PT End of Session - 08/30/21 0956     Visit Number 6    Number of Visits 17    Date for PT Re-Evaluation 10/09/21    Authorization - Visit Number 6    Progress Note Due on Visit 10    PT Start Time 0947    PT Stop Time 1027    PT Time Calculation (min) 40 min    Activity Tolerance Patient limited by pain    Behavior During Therapy [facility] for tasks assessed/performed             Past Medical History:  Diagnosis Date   Arthritis    shoulders and lower back per patient   Headache    Heart murmur    Hypertension    Thyroid disease     Past Surgical History:  Procedure Laterality Date   BREAST EXCISIONAL BIOPSY Left    BREAST LUMPECTOMY WITH RADIOACTIVE SEED LOCALIZATION Right 04/16/2021   Procedure: RIGHT BREAST LUMPECTOMY WITH RADIOACTIVE SEED LOCALIZATION;  Surgeon: Rolm Bookbinder, MD;  Location: [facility];  Service: General;  Laterality: Right;   CARPAL TUNNEL RELEASE Right    PILONIDAL CYST EXCISION     THYROID CYST EXCISION Right    TONSILLECTOMY     removed as a child    There were no vitals filed for this visit.   Subjective Assessment - 08/30/21 0952     Subjective Pt reports her R shoulder pain has increased in the last day while cleaning. She reports her pain as 7/10 on NPRS.    Pertinent History Tracy Horne is a 75  y.o. female who presents to therapy with chronic R shoulder pain for several years. Reports pain has worsened following R breast radiation 05/28/21 - 06/28/21. Patient with history of R breast cancer since Oct 2021, with progression May 2022, lumpectomy completed 04/16/21, followed by radiation and aromatase inhibitor hormone therapy  for next 5 years (no chemotherapy). Pt with MRI Sept 12 2022 indicating full thickness tear of supinatus with tendon recoil, near full thickness tear of infraspinatus, and full thickness tear of superior half of subscap, making her a good candidate for reverse total shoulder- pt indicates she would like to put off surgery at this time for as long as function allows.  Patient reports increased pain with inability to lift her R arm to shoulder height. Patient reports increased pain with all arm movements with aggravating factors primarily with lifting and carrying. Patient demonstrates decreased ability to perform ADLs such as reaching to perform, self-care, preparing food, driving, and reaching for objects on a shelf at shoulder height. Pt reports using sub zero cream, massage cream, and meloxicam for pain relief. Pt enjoys volunteering for support groups and telephone/virtual outreach. Pt denies any unexplained weight fluctuation, saddle paresthesia, loss of bowel/bladder function, or unrelenting night pain at this time.  Pt has had MRI imaging confirming R RC tear of supraspinatus and infraspinatus muscles/tendons. Patient has a PMH breast cancer with last radiation tx on 06/28/21.    Limitations Lifting;House hold activities    How long can you sit comfortably? na    How long can you stand comfortably? na    How long can you walk  comfortably? na    Diagnostic tests MRI sept 12 full thickness tear of supinatus with tendon recoil, near full thickness tear of infraspinatus, full thickness tear of superior half of subscap, severe tendoinosis and interstitial tearing of prox bicep tendon medially subluxed    Patient Stated Goals prevent surgery, increase motion, and decrease pain.              Therex    Pulleys x 15 reps flex x 3 sec hold Pulleys x 15 reps abd x 3 sec hold with decreased motion due to pain     AAROM with PVC  flex/abd in supine   R shoulder AROM in supine (gravity eliminated) 2 x  6-7 reps with patient  Serratus punches attempted but patient unable to maintain position due to increased pain and weakness     Manual therapy   R shoulder Oscillations and Distraction between reps and sets of exercise for  pain modulation with   R shoulder PROM in Flex/ABD with oscillations for pain modulation and increase ROM    *pt reported decreased pain with manual therapy and increased ROM                     PT Education - 08/30/21 1039     Education Details therex form/technique    Person(s) Educated Patient    Methods Explanation;Demonstration    Comprehension Verbalized understanding;Returned demonstration              PT Short Term Goals - 08/14/21 1732       PT SHORT TERM GOAL #1   Title Pt will demonstrate independence with HEP to improve R shoulder function for increased ability to participate with ADLs    Baseline HEP given    Time 3    Period Weeks    Status New    Target Date 09/04/21               PT Long Term Goals - 08/14/21 1733       PT LONG TERM GOAL #1   Title Patient will increase FOTO score to ** to demonstrate predicted increase in functional mobility to complete ADLs    Baseline 08/14/21:    Time 8    Period Weeks    Status New    Target Date 10/09/21      PT LONG TERM GOAL #2   Title Patient will improve R shoulder flex/abd AROM to 110 degrees in sitting without pain greater than 5/10 on NPRS.    Baseline 08/14/21: Flexion: L95/ R 75deg ABD: Abduction:L110/ R90 deg    Time 8    Period Weeks    Status New    Target Date 10/09/21      PT LONG TERM GOAL #3   Title Patient will improve R shoulder strength to 4/5 or  for abd and flexion within availible range in order to improve ability to perform self care and light household chores, utilizing compensatory musculature    Baseline 08/14/21: Flexion: -3/5, Abduction: 2+/5,    Time 8    Period Weeks    Status New                   Plan - 08/30/21  1205     Clinical Impression Statement PT session limited today d/t pain. Pt continued UE/periscapular strengthening, AA/A/ROM, and manual therapy to tolerance. Pt reported increased ROM and decrease in pain from 7/10 to 6/10 at end of session. Pt reported  increase in AROM and decrease in pain prior to this vist. Patient will continue to benefit from skilled therapy for exercise performance to ensure proper technique for safe and effective progression. Continue PT POC as able.    Personal Factors and Comorbidities Age;Time since onset of injury/illness/exacerbation;Comorbidity 2;Comorbidity 1;Sex;Past/Current Experience    Comorbidities BC with radiation tx    Examination-Activity Limitations Bathing;Dressing;Hygiene/Grooming;Lift;Carry;Reach Overhead;Sleep    Examination-Participation Restrictions Volunteer;Community Activity;Cleaning;Laundry;Meal Prep;Driving;Shop    Stability/Clinical Decision Making Evolving/Moderate complexity    Clinical Decision Making Moderate    Rehab Potential Fair    PT Frequency 2x / week    PT Duration 8 weeks    PT Treatment/Interventions ADLs/Self Care Home Management;Cryotherapy;Electrical Stimulation;Iontophoresis 4mg /ml Dexamethasone;Traction;Therapeutic exercise;Neuromuscular re-education;Therapeutic activities;Patient/family education;Manual techniques;Passive range of motion;Dry needling;Energy conservation;Splinting;Taping;Spinal Manipulations    PT Next Visit Plan manual therapy, AAROM, and periscapular strengthening    PT Home Exercise Plan table slides, stair slides, scap retraction    Consulted and Agree with Plan of Care Patient             Patient will benefit from skilled therapeutic intervention in order to improve the following deficits and impairments:  Decreased mobility, Pain, Postural dysfunction, Impaired UE functional use, Decreased strength, Decreased activity tolerance, Improper body mechanics, Decreased range of motion, Decreased  endurance  Visit Diagnosis: Chronic right shoulder pain     Problem List Patient Active Problem List   Diagnosis Date Noted   Malignant neoplasm of lower-outer quadrant of right breast of female, estrogen receptor positive ([place]) 05/06/2021   Paresthesia 04/24/2017   Bilateral shoulder pain 07/25/2016   Neck pain 07/25/2016     Durwin Reges DPT Sharion Settler, SPT  Durwin Reges, PT 08/31/2021, 9:29 AM  [facility] [address], [place], [address] Phone: [phone]   Fax:  [phone]  Name: Tracy Horne MRN: [id number] Date of Birth: September 02, 1946

## 2021-09-04 ENCOUNTER — Encounter: Payer: Self-pay | Admitting: Physical Therapy

## 2021-09-04 ENCOUNTER — Ambulatory Visit: Payer: Medicare Other | Admitting: Physical Therapy

## 2021-09-04 DIAGNOSIS — M25511 Pain in right shoulder: Secondary | ICD-10-CM | POA: Diagnosis not present

## 2021-09-04 DIAGNOSIS — G8929 Other chronic pain: Secondary | ICD-10-CM | POA: Diagnosis not present

## 2021-09-04 NOTE — Therapy (Signed)
[facility] [address] [place], [address] Phone: [phone]   Fax:  [phone]  Physical Therapy Treatment  Patient Details  Name: Tracy Horne MRN: [id number] Date of Birth: 04/29/1946 No data recorded  Encounter Date: 09/04/2021   PT End of Session - 09/04/21 1046     Visit Number 7    Number of Visits 17    Date for PT Re-Evaluation 10/09/21    Authorization - Visit Number 7    Progress Note Due on Visit 10    PT Start Time 1017    PT Stop Time 1120    PT Time Calculation (min) 38 min             Past Medical History:  Diagnosis Date   Arthritis    shoulders and lower back per patient   Headache    Heart murmur    Hypertension    Thyroid disease     Past Surgical History:  Procedure Laterality Date   BREAST EXCISIONAL BIOPSY Left    BREAST LUMPECTOMY WITH RADIOACTIVE SEED LOCALIZATION Right 04/16/2021   Procedure: RIGHT BREAST LUMPECTOMY WITH RADIOACTIVE SEED LOCALIZATION;  Surgeon: Rolm Bookbinder, MD;  Location: [facility];  Service: General;  Laterality: Right;   CARPAL TUNNEL RELEASE Right    PILONIDAL CYST EXCISION     THYROID CYST EXCISION Right    TONSILLECTOMY     removed as a child    There were no vitals filed for this visit.   Subjective Assessment - 09/04/21 1044     Subjective Pt reports her R shoulder pain has increased in the last day while cleaning. She reports her pain as 3/10 on NPRS.    Pertinent History Tracy Horne is a 75  y.o. female who presents to therapy with chronic R shoulder pain for several years. Reports pain has worsened following R breast radiation 05/28/21 - 06/28/21. Patient with history of R breast cancer since Oct 2021, with progression May 2022, lumpectomy completed 04/16/21, followed by radiation and aromatase inhibitor hormone therapy for next 5 years (no chemotherapy). Pt with MRI Sept 12 2022 indicating full thickness tear of supinatus  with tendon recoil, near full thickness tear of infraspinatus, and full thickness tear of superior half of subscap, making her a good candidate for reverse total shoulder- pt indicates she would like to put off surgery at this time for as long as function allows.  Patient reports increased pain with inability to lift her R arm to shoulder height. Patient reports increased pain with all arm movements with aggravating factors primarily with lifting and carrying. Patient demonstrates decreased ability to perform ADLs such as reaching to perform, self-care, preparing food, driving, and reaching for objects on a shelf at shoulder height. Pt reports using sub zero cream, massage cream, and meloxicam for pain relief. Pt enjoys volunteering for support groups and telephone/virtual outreach. Pt denies any unexplained weight fluctuation, saddle paresthesia, loss of bowel/bladder function, or unrelenting night pain at this time.  Pt has had MRI imaging confirming R RC tear of supraspinatus and infraspinatus muscles/tendons. Patient has a PMH breast cancer with last radiation tx on 06/28/21.    Limitations Lifting;House hold activities    How long can you sit comfortably? na    How long can you stand comfortably? na    How long can you walk comfortably? na    Diagnostic tests MRI sept 12 full thickness tear of supinatus with tendon recoil, near  full thickness tear of infraspinatus, full thickness tear of superior half of subscap, severe tendoinosis and interstitial tearing of prox bicep tendon medially subluxed    Patient Stated Goals prevent surgery, increase motion, and decrease pain.             Therex    Pulleys x 15 reps flex x 3 sec hold Pulleys x 15 reps abd x 3 sec hold    Physioball overhead rolls R shoulder flex against wall 2 x 7-8 reps with comfortable range between (50-115 deg) flex;   Standing Shoulder ABD on wall within available range 2 x 6-8 reps R UE   Standing Shoulder Scaption "y  raises" 2 x 8 reps BUE (0-115 AROM) with increased R shoulder hiking  Theraband Row YTB 2 x 10 reps with emphasis on scapular retraction    Shoulder Extension from door height 2 x 10 reps YTB with tactile cues to contract Lat muscles              PT Education - 09/04/21 1153     Education Details therex form/technique    Person(s) Educated Patient    Methods Explanation;Demonstration    Comprehension Verbalized understanding;Returned demonstration              PT Short Term Goals - 08/14/21 1732       PT SHORT TERM GOAL #1   Title Pt will demonstrate independence with HEP to improve R shoulder function for increased ability to participate with ADLs    Baseline HEP given    Time 3    Period Weeks    Status New    Target Date 09/04/21               PT Long Term Goals - 08/14/21 1733       PT LONG TERM GOAL #1   Title Patient will increase FOTO score to ** to demonstrate predicted increase in functional mobility to complete ADLs    Baseline 08/14/21:    Time 8    Period Weeks    Status New    Target Date 10/09/21      PT LONG TERM GOAL #2   Title Patient will improve R shoulder flex/abd AROM to 110 degrees in sitting without pain greater than 5/10 on NPRS.    Baseline 08/14/21: Flexion: L95/ R 75deg ABD: Abduction:L110/ R90 deg    Time 8    Period Weeks    Status New    Target Date 10/09/21      PT LONG TERM GOAL #3   Title Patient will improve R shoulder strength to 4/5 or  for abd and flexion within availible range in order to improve ability to perform self care and light household chores, utilizing compensatory musculature    Baseline 08/14/21: Flexion: -3/5, Abduction: 2+/5,    Time 8    Period Weeks    Status New                   Plan - 09/04/21 1149     Clinical Impression Statement PT continued UE/periscapular strengthening this session utilizing AAROM/AROM and light resistance. PT focused on UE strengthening and stability and  ROM within pain tolerance. Pt tolerated session well evidenced by reports of no increase in R shoulder pain following therex. Pt continues demonstrate decreased strength evidence by increase shoulder hiking with reaching and lifting UE overhead. Patient will continue to benefit from skilled therapy for exercise performance to ensure proper technique for  safe and effective progression. Continue PT POC as able.    Personal Factors and Comorbidities Age;Time since onset of injury/illness/exacerbation;Comorbidity 2;Comorbidity 1;Sex;Past/Current Experience    Comorbidities BC with radiation tx    Examination-Activity Limitations Bathing;Dressing;Hygiene/Grooming;Lift;Carry;Reach Overhead;Sleep    Examination-Participation Restrictions Volunteer;Community Activity;Cleaning;Laundry;Meal Prep;Driving;Shop    Stability/Clinical Decision Making Evolving/Moderate complexity    Clinical Decision Making Moderate    Rehab Potential Fair    PT Frequency 2x / week    PT Duration 8 weeks    PT Treatment/Interventions ADLs/Self Care Home Management;Cryotherapy;Electrical Stimulation;Iontophoresis 4mg /ml Dexamethasone;Traction;Therapeutic exercise;Neuromuscular re-education;Therapeutic activities;Patient/family education;Manual techniques;Passive range of motion;Dry needling;Energy conservation;Splinting;Taping;Spinal Manipulations    PT Next Visit Plan manual therapy, AAROM, and periscapular strengthening    PT Home Exercise Plan table slides, stair slides, scap retraction    Consulted and Agree with Plan of Care Patient             Patient will benefit from skilled therapeutic intervention in order to improve the following deficits and impairments:  Decreased mobility, Pain, Postural dysfunction, Impaired UE functional use, Decreased strength, Decreased activity tolerance, Improper body mechanics, Decreased range of motion, Decreased endurance  Visit Diagnosis: Chronic right shoulder pain     Problem  List Patient Active Problem List   Diagnosis Date Noted   Malignant neoplasm of lower-outer quadrant of right breast of female, estrogen receptor positive ([place]) 05/06/2021   Paresthesia 04/24/2017   Bilateral shoulder pain 07/25/2016   Neck pain 07/25/2016     Durwin Reges DPT Sharion Settler, SPT  Durwin Reges, PT 09/04/2021, 5:28 PM  [facility] [address], [place], [address] Phone: [phone]   Fax:  [phone]  Name: Tracy Horne MRN: [id number] Date of Birth: [birth year]

## 2021-09-06 ENCOUNTER — Encounter: Payer: Self-pay | Admitting: Physical Therapy

## 2021-09-06 ENCOUNTER — Ambulatory Visit: Payer: Medicare Other | Admitting: Physical Therapy

## 2021-09-06 DIAGNOSIS — G8929 Other chronic pain: Secondary | ICD-10-CM

## 2021-09-06 DIAGNOSIS — M25511 Pain in right shoulder: Secondary | ICD-10-CM | POA: Diagnosis not present

## 2021-09-06 NOTE — Therapy (Signed)
[facility] [address], [place], [address] Phone: [phone]   Fax:  [phone]  Physical Therapy Treatment  Patient Details  Name: Tracy Horne MRN: [id number] Date of Birth: [birth year] No data recorded  Encounter Date: 09/06/2021   PT End of Session - 09/06/21 1036     Visit Number 8    Number of Visits 17    Date for PT Re-Evaluation 10/09/21    Authorization - Visit Number 8    Progress Note Due on Visit 10    PT Start Time 1032    PT Stop Time 1110    PT Time Calculation (min) 38 min    Activity Tolerance Patient tolerated treatment well    Behavior During Therapy [facility] for tasks assessed/performed             Past Medical History:  Diagnosis Date   Arthritis    shoulders and lower back per patient   Headache    Heart murmur    Hypertension    Thyroid disease     Past Surgical History:  Procedure Laterality Date   BREAST EXCISIONAL BIOPSY Left    BREAST LUMPECTOMY WITH RADIOACTIVE SEED LOCALIZATION Right 04/16/2021   Procedure: RIGHT BREAST LUMPECTOMY WITH RADIOACTIVE SEED LOCALIZATION;  Surgeon: Rolm Bookbinder, MD;  Location: [facility];  Service: General;  Laterality: Right;   CARPAL TUNNEL RELEASE Right    PILONIDAL CYST EXCISION     THYROID CYST EXCISION Right    TONSILLECTOMY     removed as a child    There were no vitals filed for this visit.   Subjective Assessment - 09/06/21 1033     Subjective Pt reports her R shoulder pain as 4/10 on NPRS today. She reports having some pain in her R hand throughout the night but was able to change position and it subside. She reports being able to lay on her R side more with less pain.    Pertinent History Tracy Horne is a 75  y.o. female who presents to therapy with chronic R shoulder pain for several years. Reports pain has worsened following R breast radiation 05/28/21 - 06/28/21. Patient with history of R breast cancer since Oct  2021, with progression May 2022, lumpectomy completed 04/16/21, followed by radiation and aromatase inhibitor hormone therapy for next 5 years (no chemotherapy). Pt with MRI Sept 12 2022 indicating full thickness tear of supinatus with tendon recoil, near full thickness tear of infraspinatus, and full thickness tear of superior half of subscap, making her a good candidate for reverse total shoulder- pt indicates she would like to put off surgery at this time for as long as function allows.  Patient reports increased pain with inability to lift her R arm to shoulder height. Patient reports increased pain with all arm movements with aggravating factors primarily with lifting and carrying. Patient demonstrates decreased ability to perform ADLs such as reaching to perform, self-care, preparing food, driving, and reaching for objects on a shelf at shoulder height. Pt reports using sub zero cream, massage cream, and meloxicam for pain relief. Pt enjoys volunteering for support groups and telephone/virtual outreach. Pt denies any unexplained weight fluctuation, saddle paresthesia, loss of bowel/bladder function, or unrelenting night pain at this time.  Pt has had MRI imaging confirming R RC tear of supraspinatus and infraspinatus muscles/tendons. Patient has a PMH breast cancer with last radiation tx on 06/28/21.    Limitations Lifting;House hold activities    How  long can you sit comfortably? na    How long can you stand comfortably? na    How long can you walk comfortably? na    Diagnostic tests MRI sept 12 full thickness tear of supinatus with tendon recoil, near full thickness tear of infraspinatus, full thickness tear of superior half of subscap, severe tendoinosis and interstitial tearing of prox bicep tendon medially subluxed    Patient Stated Goals prevent surgery, increase motion, and decrease pain.            Therex    Pulleys x 15 reps flex x 3 sec hold Pulleys x 15 reps abd x 3 sec hold   Seated  Shoulder ABD within available range 2 x 6-8 reps R UE    Seated Shoulder Scaption "y raises" 2 x 6-8 reps RUE (0- 100 AROM) with increased R shoulder hiking  Wall Balls with R UE Ext 2 x 20 sec clockwise/counterclockwise  R shoulder D2 1 x 4 reps  with patients reports of increased pain    Standing Lat Pull down RTB 2 x 10 reps with emphasis on scapular retraction    Shoulder Extension  2 x 10 reps with cues for elbow extension and scapular retraction                                PT Education - 09/06/21 1036     Education Details therex form/technique    Person(s) Educated Patient    Methods Explanation;Demonstration    Comprehension Verbalized understanding;Returned demonstration              PT Short Term Goals - 08/14/21 1732       PT SHORT TERM GOAL #1   Title Pt will demonstrate independence with HEP to improve R shoulder function for increased ability to participate with ADLs    Baseline HEP given    Time 3    Period Weeks    Status New    Target Date 09/04/21               PT Long Term Goals - 08/14/21 1733       PT LONG TERM GOAL #1   Title Patient will increase FOTO score to ** to demonstrate predicted increase in functional mobility to complete ADLs    Baseline 08/14/21:    Time 8    Period Weeks    Status New    Target Date 10/09/21      PT LONG TERM GOAL #2   Title Patient will improve R shoulder flex/abd AROM to 110 degrees in sitting without pain greater than 5/10 on NPRS.    Baseline 08/14/21: Flexion: L95/ R 75deg ABD: Abduction:L110/ R90 deg    Time 8    Period Weeks    Status New    Target Date 10/09/21      PT LONG TERM GOAL #3   Title Patient will improve R shoulder strength to 4/5 or  for abd and flexion within availible range in order to improve ability to perform self care and light household chores, utilizing compensatory musculature    Baseline 08/14/21: Flexion: -3/5, Abduction: 2+/5,    Time 8     Period Weeks    Status New                   Plan - 09/06/21 1113     Clinical Impression Statement Pt tolerated session well  evidenced by no reports increase in R shoulder pain following therex. PT continued UE/periscapular strengthening this session utilizing AAROM/AROM and light resistance. PT focused on UE strengthening and ROM within available range. Pt continues demonstrate decreased strength evidence by increase shoulder hiking but is improving AROM. Patient will continue to benefit from skilled therapy for exercise performance to ensure proper technique for safe and effective progression. Continue PT POC as able.    Personal Factors and Comorbidities Age;Time since onset of injury/illness/exacerbation;Comorbidity 2;Comorbidity 1;Sex;Past/Current Experience    Comorbidities BC with radiation tx    Examination-Activity Limitations Bathing;Dressing;Hygiene/Grooming;Lift;Carry;Reach Overhead;Sleep    Examination-Participation Restrictions Volunteer;Community Activity;Cleaning;Laundry;Meal Prep;Driving;Shop    Stability/Clinical Decision Making Evolving/Moderate complexity    Clinical Decision Making Moderate    Rehab Potential Fair    PT Frequency 2x / week    PT Duration 8 weeks    PT Treatment/Interventions ADLs/Self Care Home Management;Cryotherapy;Electrical Stimulation;Iontophoresis 4mg /ml Dexamethasone;Traction;Therapeutic exercise;Neuromuscular re-education;Therapeutic activities;Patient/family education;Manual techniques;Passive range of motion;Dry needling;Energy conservation;Splinting;Taping;Spinal Manipulations    PT Next Visit Plan update HEP    PT Home Exercise Plan table slides, stair slides, scap retraction    Consulted and Agree with Plan of Care Patient             Patient will benefit from skilled therapeutic intervention in order to improve the following deficits and impairments:  Decreased mobility, Pain, Postural dysfunction, Impaired UE functional use,  Decreased strength, Decreased activity tolerance, Improper body mechanics, Decreased range of motion, Decreased endurance  Visit Diagnosis: Chronic right shoulder pain     Problem List Patient Active Problem List   Diagnosis Date Noted   Malignant neoplasm of lower-outer quadrant of right breast of female, estrogen receptor positive ([place]) 05/06/2021   Paresthesia 04/24/2017   Bilateral shoulder pain 07/25/2016   Neck pain 07/25/2016    Durwin Reges DPT Sharion Settler, SPT  Durwin Reges, PT 09/07/2021, 9:14 AM  [facility] [address] [place], [address] Phone: [phone]   Fax:  [phone]  Name: Sarahbeth Cashin MRN: [id number] Date of Birth: 10/18/1946

## 2021-09-11 ENCOUNTER — Encounter: Payer: Self-pay | Admitting: Physical Therapy

## 2021-09-11 ENCOUNTER — Ambulatory Visit: Payer: Medicare Other | Attending: Orthopedic Surgery

## 2021-09-11 DIAGNOSIS — M25511 Pain in right shoulder: Secondary | ICD-10-CM | POA: Insufficient documentation

## 2021-09-11 DIAGNOSIS — G8929 Other chronic pain: Secondary | ICD-10-CM | POA: Diagnosis not present

## 2021-09-11 NOTE — Therapy (Signed)
[facility] [address] [place], [address] Phone: [phone]   Fax:  [phone]  Physical Therapy Treatment  Patient Details  Name: Tracy Horne MRN: [id number] Date of Birth: 12-22-1945 No data recorded  Encounter Date: 09/11/2021   PT End of Session - 09/11/21 1044     Visit Number 9    Number of Visits 17    Date for PT Re-Evaluation 10/09/21    Authorization - Visit Number 9    Progress Note Due on Visit 10    PT Start Time 1039    PT Stop Time 1119    PT Time Calculation (min) 40 min    Activity Tolerance Patient tolerated treatment well    Behavior During Therapy [facility] for tasks assessed/performed             Past Medical History:  Diagnosis Date   Arthritis    shoulders and lower back per patient   Headache    Heart murmur    Hypertension    Thyroid disease     Past Surgical History:  Procedure Laterality Date   BREAST EXCISIONAL BIOPSY Left    BREAST LUMPECTOMY WITH RADIOACTIVE SEED LOCALIZATION Right 04/16/2021   Procedure: RIGHT BREAST LUMPECTOMY WITH RADIOACTIVE SEED LOCALIZATION;  Surgeon: Rolm Bookbinder, MD;  Location: [facility];  Service: General;  Laterality: Right;   CARPAL TUNNEL RELEASE Right    PILONIDAL CYST EXCISION     THYROID CYST EXCISION Right    TONSILLECTOMY     removed as a child    There were no vitals filed for this visit.   Subjective Assessment - 09/11/21 1040     Subjective Pt reports her R shoulder pain as 4/10 on NPRS today. She reports having "better motions" than before.    Pertinent History Tracy Horne is a 75  y.o. female who presents to therapy with chronic R shoulder pain for several years. Reports pain has worsened following R breast radiation 05/28/21 - 06/28/21. Patient with history of R breast cancer since Oct 2021, with progression May 2022, lumpectomy completed 04/16/21, followed by radiation and aromatase inhibitor hormone therapy for  next 5 years (no chemotherapy). Pt with MRI Sept 12 2022 indicating full thickness tear of supinatus with tendon recoil, near full thickness tear of infraspinatus, and full thickness tear of superior half of subscap, making her a good candidate for reverse total shoulder- pt indicates she would like to put off surgery at this time for as long as function allows.  Patient reports increased pain with inability to lift her R arm to shoulder height. Patient reports increased pain with all arm movements with aggravating factors primarily with lifting and carrying. Patient demonstrates decreased ability to perform ADLs such as reaching to perform, self-care, preparing food, driving, and reaching for objects on a shelf at shoulder height. Pt reports using sub zero cream, massage cream, and meloxicam for pain relief. Pt enjoys volunteering for support groups and telephone/virtual outreach. Pt denies any unexplained weight fluctuation, saddle paresthesia, loss of bowel/bladder function, or unrelenting night pain at this time.  Pt has had MRI imaging confirming R RC tear of supraspinatus and infraspinatus muscles/tendons. Patient has a PMH breast cancer with last radiation tx on 06/28/21.    Limitations Lifting;House hold activities    How long can you sit comfortably? na    How long can you stand comfortably? na    How long can you walk comfortably? na  Diagnostic tests MRI sept 12 full thickness tear of supinatus with tendon recoil, near full thickness tear of infraspinatus, full thickness tear of superior half of subscap, severe tendoinosis and interstitial tearing of prox bicep tendon medially subluxed    Patient Stated Goals prevent surgery, increase motion, and decrease pain.    Currently in Pain? Yes              Therex    Pulleys x 15 reps flex x 3 sec hold Pulleys x 15 reps abd x 3 sec hold    Standing Shoulder flexion within available range 2 x 10 reps R UE with full AROM   Seated Shoulder  ABD 2 x 6-8 reps RUE (0- 100 AROM) with increased R shoulder hiking   Wall Balls with R UE Ext 2 x 20 sec clockwise/counterclockwise with cues for shoulder protraction  Bicep Curls YTB 2 x 10 reps with cues for isolation  Triceps press down YTB 2 x 10 reps with cues to keep elbows tucked   High Row RTB 1 x 5;  1 x 5 YTB  with tactile cues to maintain shoulder abd             PT Education - 09/11/21 1127     Education Details therex form/technique    Person(s) Educated Patient    Methods Explanation;Demonstration    Comprehension Verbalized understanding;Returned demonstration              PT Short Term Goals - 08/14/21 1732       PT SHORT TERM GOAL #1   Title Pt will demonstrate independence with HEP to improve R shoulder function for increased ability to participate with ADLs    Baseline HEP given    Time 3    Period Weeks    Status New    Target Date 09/04/21               PT Long Term Goals - 09/11/21 1050       PT LONG TERM GOAL #1   Title Patient will increase FOTO score to 61 to demonstrate predicted increase in functional mobility to complete ADLs    Baseline 08/14/21 :41    Time 8    Status New      PT LONG TERM GOAL #2   Title Patient will improve R shoulder flex/abd AROM to 110 degrees in sitting without pain greater than 5/10 on NPRS.    Baseline 08/14/21: Flexion: L95/ R 75deg ABD: Abduction:L110/ R90 deg    Time 8    Period Weeks    Status New      PT LONG TERM GOAL #3   Title Patient will improve R shoulder strength to 4/5 or  for abd and flexion within availible range in order to improve ability to perform self care and light household chores, utilizing compensatory musculature    Baseline 08/14/21: Flexion: -3/5, Abduction: 2+/5,    Time 8    Period Weeks    Status New                   Plan - 09/11/21 1132     Clinical Impression Statement PT continued UE/periscapular strengthening this session. Pt is progressing  with AROM reaching full shoulder flexion and approximately 110 deg abuction bilaterally. Patient will continue to benefit from skilled therapy for exercise performance to ensure proper technique for safe and effective progression. PT will perform progress note next session.    Personal Factors and  Comorbidities Age;Time since onset of injury/illness/exacerbation;Comorbidity 2;Comorbidity 1;Sex;Past/Current Experience    Comorbidities BC with radiation tx    Examination-Activity Limitations Bathing;Dressing;Hygiene/Grooming;Lift;Carry;Reach Overhead;Sleep    Examination-Participation Restrictions Volunteer;Community Activity;Cleaning;Laundry;Meal Prep;Driving;Shop    Stability/Clinical Decision Making Evolving/Moderate complexity    Clinical Decision Making Moderate    Rehab Potential Fair    PT Frequency 2x / week    PT Duration 8 weeks    PT Treatment/Interventions ADLs/Self Care Home Management;Cryotherapy;Electrical Stimulation;Iontophoresis 4mg /ml Dexamethasone;Traction;Therapeutic exercise;Neuromuscular re-education;Therapeutic activities;Patient/family education;Manual techniques;Passive range of motion;Dry needling;Energy conservation;Splinting;Taping;Spinal Manipulations    PT Next Visit Plan Progress note/HEP Update    PT Home Exercise Plan table slides, stair slides, scap retraction    Consulted and Agree with Plan of Care Patient             Patient will benefit from skilled therapeutic intervention in order to improve the following deficits and impairments:  Decreased mobility, Pain, Postural dysfunction, Impaired UE functional use, Decreased strength, Decreased activity tolerance, Improper body mechanics, Decreased range of motion, Decreased endurance  Visit Diagnosis: Chronic right shoulder pain     Problem List Patient Active Problem List   Diagnosis Date Noted   Malignant neoplasm of lower-outer quadrant of right breast of female, estrogen receptor positive ([place])  05/06/2021   Paresthesia 04/24/2017   Bilateral shoulder pain 07/25/2016   Neck pain 07/25/2016   Sharion Settler, SPT  Sharion Settler, Student-PT 09/11/2021, 11:52 AM  [facility] [address] [place], [address] Phone: [phone]   Fax:  [phone]  Name: Tracy Horne MRN: [id number] Date of Birth: 1946/04/17

## 2021-09-13 ENCOUNTER — Ambulatory Visit: Payer: Medicare Other | Admitting: Physical Therapy

## 2021-09-13 DIAGNOSIS — G8929 Other chronic pain: Secondary | ICD-10-CM | POA: Diagnosis not present

## 2021-09-13 DIAGNOSIS — M25511 Pain in right shoulder: Secondary | ICD-10-CM | POA: Diagnosis not present

## 2021-09-13 NOTE — Therapy (Signed)
[facility] [address] [place], [address] Phone: [phone]   Fax:  [phone]  Physical Therapy Treatment Progress Note  Reporting Period 08/14/21-09/13/21  Patient Details  Name: Tracy Horne MRN: [id number] Date of Birth: Jul 27, 1946 No data recorded  Encounter Date: 09/13/2021   PT End of Session - 09/13/21 1032     Visit Number 10    Number of Visits 17    Date for PT Re-Evaluation 10/09/21    Authorization - Visit Number 9    Progress Note Due on Visit 10    PT Start Time 1032    PT Stop Time 1112    PT Time Calculation (min) 40 min    Activity Tolerance Patient tolerated treatment well    Behavior During Therapy [facility] for tasks assessed/performed             Past Medical History:  Diagnosis Date   Arthritis    shoulders and lower back per patient   Headache    Heart murmur    Hypertension    Thyroid disease     Past Surgical History:  Procedure Laterality Date   BREAST EXCISIONAL BIOPSY Left    BREAST LUMPECTOMY WITH RADIOACTIVE SEED LOCALIZATION Right 04/16/2021   Procedure: RIGHT BREAST LUMPECTOMY WITH RADIOACTIVE SEED LOCALIZATION;  Surgeon: Rolm Bookbinder, MD;  Location: [facility];  Service: General;  Laterality: Right;   CARPAL TUNNEL RELEASE Right    PILONIDAL CYST EXCISION     THYROID CYST EXCISION Right    TONSILLECTOMY     removed as a child    There were no vitals filed for this visit.   Subjective Assessment - 09/13/21 1044     Subjective Pt reports her R shoulder pain as 4/10 on NPRS today. She reports feeling much more improvement in her shoulder motion, pain, and strength.    Pertinent History Tracy Horne is a 75  y.o. female who presents to therapy with chronic R shoulder pain for several years. Reports pain has worsened following R breast radiation 05/28/21 - 06/28/21. Patient with history of R breast cancer since Oct 2021, with progression May 2022,  lumpectomy completed 04/16/21, followed by radiation and aromatase inhibitor hormone therapy for next 5 years (no chemotherapy). Pt with MRI Sept 12 2022 indicating full thickness tear of supinatus with tendon recoil, near full thickness tear of infraspinatus, and full thickness tear of superior half of subscap, making her a good candidate for reverse total shoulder- pt indicates she would like to put off surgery at this time for as long as function allows.  Patient reports increased pain with inability to lift her R arm to shoulder height. Patient reports increased pain with all arm movements with aggravating factors primarily with lifting and carrying. Patient demonstrates decreased ability to perform ADLs such as reaching to perform, self-care, preparing food, driving, and reaching for objects on a shelf at shoulder height. Pt reports using sub zero cream, massage cream, and meloxicam for pain relief. Pt enjoys volunteering for support groups and telephone/virtual outreach. Pt denies any unexplained weight fluctuation, saddle paresthesia, loss of bowel/bladder function, or unrelenting night pain at this time.  Pt has had MRI imaging confirming R RC tear of supraspinatus and infraspinatus muscles/tendons. Patient has a PMH breast cancer with last radiation tx on 06/28/21.    Limitations Lifting;House hold activities    How long can you sit comfortably? na    How long can you stand comfortably? na  How long can you walk comfortably? na    Diagnostic tests MRI sept 12 full thickness tear of supinatus with tendon recoil, near full thickness tear of infraspinatus, full thickness tear of superior half of subscap, severe tendoinosis and interstitial tearing of prox bicep tendon medially subluxed    Patient Stated Goals prevent surgery, increase motion, and decrease pain.             Therex    Pulleys x 12 reps flex x 3 sec hold Pulleys x 12 reps abd x 3 sec hold   Bicep Curls YTB 2 x 10 reps with cues  for isolation   Triceps press down RTB 2 x 10 reps with cues to keep elbows tucked   Access Code: [email or web address]   Exercises Wall Angels - 1 x daily - 2-3 x weekly - 3 sets - 8-10 reps Standing Shoulder Flexion Full Range - 1 x daily - 2-3 x weekly - 3 sets - 8-10 reps Seated Shoulder Row with Anchored Resistance - 1 x daily - 2-3 x weekly - 3 sets - 8-10 reps Shoulder Flexion Wall Slide with Towel - 1 x daily - 2-3 x weekly - 3 sets - 8-10 reps    *The HEP given was demonstrated and reviewed with patient to ensure proper form/technique.              PT Short Term Goals - 09/13/21 1200       PT SHORT TERM GOAL #1   Title Pt will demonstrate independence with HEP to improve R shoulder function for increased ability to participate with ADLs    Baseline HEP given    Time 3    Period Weeks    Status Achieved               PT Long Term Goals - 09/13/21 1033       PT LONG TERM GOAL #1   Title Patient will increase FOTO score to 61 to demonstrate predicted increase in functional mobility to complete ADLs    Baseline 08/14/21 :41 09/13/21:60    Time 8    Period Weeks      PT LONG TERM GOAL #2   Title Patient will improve R shoulder flex/abd AROM to 110 degrees in sitting without pain greater than 5/10 on NPRS.    Baseline 08/14/21: Flexion: L95/ R 75deg : Abduction:L110/ R90 deg;09/13/21 Flexion:R 135deg : Abduction: 140 deg    Time 8    Period Weeks    Status On-going      PT LONG TERM GOAL #3   Title Patient will improve R shoulder strength to 4/5 or for abd and flexion within availible range in order to improve ability to perform self care and light household chores, utilizing compensatory musculature    Baseline 08/14/21: Flexion: -3/5, Abduction: 2+/5, 09/13/21: Flexion: 3/5, Abduction 4-/5    Time 8    Period Weeks    Status On-going                   Plan - 09/13/21 1250     Clinical Impression Statement Progress note performed this date. Pt  educated on progress with PT. Pt has demonstrated full R shoulder AROM, increased R shoudler strength, and self reported increased in functional activity. Pt does however continue to have pain and shoulder hiking with overhead activity. PT updated and reviewed HEP with patient to ensure proper form and technique. PT utilized multimodal cueing to ensure safe and effective  exercise progression with success. Continue PT POC.    Personal Factors and Comorbidities Age;Time since onset of injury/illness/exacerbation;Comorbidity 2;Comorbidity 1;Sex;Past/Current Experience    Comorbidities BC with radiation tx    Examination-Activity Limitations Bathing;Dressing;Hygiene/Grooming;Lift;Carry;Reach Overhead;Sleep    Examination-Participation Restrictions Volunteer;Community Activity;Cleaning;Laundry;Meal Prep;Driving;Shop    Stability/Clinical Decision Making Evolving/Moderate complexity    Clinical Decision Making Moderate    Rehab Potential Fair    PT Frequency 2x / week    PT Duration 8 weeks    PT Treatment/Interventions ADLs/Self Care Home Management;Cryotherapy;Electrical Stimulation;Iontophoresis 4mg /ml Dexamethasone;Traction;Therapeutic exercise;Neuromuscular re-education;Therapeutic activities;Patient/family education;Manual techniques;Passive range of motion;Dry needling;Energy conservation;Splinting;Taping;Spinal Manipulations    PT Next Visit Plan review HEP    PT Home Exercise Plan AROM, TROW    Consulted and Agree with Plan of Care Patient             Patient will benefit from skilled therapeutic intervention in order to improve the following deficits and impairments:  Decreased mobility, Pain, Postural dysfunction, Impaired UE functional use, Decreased strength, Decreased activity tolerance, Improper body mechanics, Decreased range of motion, Decreased endurance  Visit Diagnosis: Chronic right shoulder pain     Problem List Patient Active Problem List   Diagnosis Date Noted    Malignant neoplasm of lower-outer quadrant of right breast of female, estrogen receptor positive ([place]) 05/06/2021   Paresthesia 04/24/2017   Bilateral shoulder pain 07/25/2016   Neck pain 07/25/2016    Durwin Reges DPT Sharion Settler, SPT  Durwin Reges, PT 09/13/2021, 4:26 PM  [facility] [address] [place], [address] Phone: [phone]   Fax:  [phone]  Name: Tracy Horne MRN: [id number] Date of Birth: January 21, 1946

## 2021-09-17 ENCOUNTER — Ambulatory Visit: Payer: Medicare Other | Admitting: Physical Therapy

## 2021-09-17 ENCOUNTER — Encounter: Payer: Self-pay | Admitting: Physical Therapy

## 2021-09-17 DIAGNOSIS — M25511 Pain in right shoulder: Secondary | ICD-10-CM | POA: Diagnosis not present

## 2021-09-17 DIAGNOSIS — G8929 Other chronic pain: Secondary | ICD-10-CM

## 2021-09-17 NOTE — Therapy (Signed)
[facility] [address] [place], [address] Phone: [phone]   Fax:  [phone]  Physical Therapy Treatment  Patient Details  Name: Tracy Horne MRN: [id number] Date of Birth: 05/16/1946 No data recorded  Encounter Date: 09/17/2021   PT End of Session - 09/17/21 1124     Visit Number 11    Number of Visits 17    Date for PT Re-Evaluation 10/09/21    Authorization - Visit Number 1    Progress Note Due on Visit 10    PT Start Time 1118    PT Stop Time 1158    PT Time Calculation (min) 40 min    Activity Tolerance Patient tolerated treatment well    Behavior During Therapy [facility] for tasks assessed/performed             Past Medical History:  Diagnosis Date   Arthritis    shoulders and lower back per patient   Headache    Heart murmur    Hypertension    Thyroid disease     Past Surgical History:  Procedure Laterality Date   BREAST EXCISIONAL BIOPSY Left    BREAST LUMPECTOMY WITH RADIOACTIVE SEED LOCALIZATION Right 04/16/2021   Procedure: RIGHT BREAST LUMPECTOMY WITH RADIOACTIVE SEED LOCALIZATION;  Surgeon: Rolm Bookbinder, MD;  Location: [facility];  Service: General;  Laterality: Right;   CARPAL TUNNEL RELEASE Right    PILONIDAL CYST EXCISION     THYROID CYST EXCISION Right    TONSILLECTOMY     removed as a child    There were no vitals filed for this visit.   Subjective Assessment - 09/17/21 1122     Subjective Pt reports her R shoulder pain as 3-4/10 on NPRS today. She reports noticing aving greater R shoulder AROM with driving and turning the steering wheel.    Pertinent History Tracy Horne is a 75  y.o. female who presents to therapy with chronic R shoulder pain for several years. Reports pain has worsened following R breast radiation 05/28/21 - 06/28/21. Patient with history of R breast cancer since Oct 2021, with progression May 2022, lumpectomy completed 04/16/21, followed by  radiation and aromatase inhibitor hormone therapy for next 5 years (no chemotherapy). Pt with MRI Sept 12 2022 indicating full thickness tear of supinatus with tendon recoil, near full thickness tear of infraspinatus, and full thickness tear of superior half of subscap, making her a good candidate for reverse total shoulder- pt indicates she would like to put off surgery at this time for as long as function allows.  Patient reports increased pain with inability to lift her R arm to shoulder height. Patient reports increased pain with all arm movements with aggravating factors primarily with lifting and carrying. Patient demonstrates decreased ability to perform ADLs such as reaching to perform, self-care, preparing food, driving, and reaching for objects on a shelf at shoulder height. Pt reports using sub zero cream, massage cream, and meloxicam for pain relief. Pt enjoys volunteering for support groups and telephone/virtual outreach. Pt denies any unexplained weight fluctuation, saddle paresthesia, loss of bowel/bladder function, or unrelenting night pain at this time.  Pt has had MRI imaging confirming R RC tear of supraspinatus and infraspinatus muscles/tendons. Patient has a PMH breast cancer with last radiation tx on 06/28/21.    Limitations Lifting;House hold activities    How long can you sit comfortably? na    How long can you stand comfortably? na    How  long can you walk comfortably? na    Diagnostic tests MRI sept 12 full thickness tear of supinatus with tendon recoil, near full thickness tear of infraspinatus, full thickness tear of superior half of subscap, severe tendoinosis and interstitial tearing of prox bicep tendon medially subluxed    Patient Stated Goals prevent surgery, increase motion, and decrease pain.            Therex    Standing Shoulder flexion within available range 1 x 5 reps R UE with full AROM #1 DB 1 x 5 BW; 1 x 6 reps BW   Standing Shoulder ABD #1 1 x 10 reps RUE  (0- 100 AROM) ; 1 x 10 reps BW with increased R shoulder hiking   Wall Balls with R UE Ext  x 20 sec clockwise/counterclockwise  (above, @, below shoulder height) with cues for shoulder protraction and body alignment   Y Foam Roller Raises 2 x 10 reps with emphasis on scapular protraction    Seated OMEGA Row 3 x 10 15# with cues to perform scapular retraction                            PT Education - 09/17/21 1418     Education Details therex form/technique    Person(s) Educated Patient    Methods Explanation;Demonstration    Comprehension Verbalized understanding              PT Short Term Goals - 09/13/21 1200       PT SHORT TERM GOAL #1   Title Pt will demonstrate independence with HEP to improve R shoulder function for increased ability to participate with ADLs    Baseline HEP given    Time 3    Period Weeks    Status Achieved               PT Long Term Goals - 09/13/21 1033       PT LONG TERM GOAL #1   Title Patient will increase FOTO score to 61 to demonstrate predicted increase in functional mobility to complete ADLs    Baseline 08/14/21 :41 09/13/21:60    Time 8    Period Weeks      PT LONG TERM GOAL #2   Title Patient will improve R shoulder flex/abd AROM to 110 degrees in sitting without pain greater than 5/10 on NPRS.    Baseline 08/14/21: Flexion: L95/ R 75deg : Abduction:L110/ R90 deg;09/13/21 Flexion:R 135deg : Abduction: 140 deg    Time 8    Period Weeks    Status On-going      PT LONG TERM GOAL #3   Title Patient will improve R shoulder strength to 4/5 or for abd and flexion within availible range in order to improve ability to perform self care and light household chores, utilizing compensatory musculature    Baseline 08/14/21: Flexion: -3/5, Abduction: 2+/5, 09/13/21: Flexion: 3/5, Abduction 4-/5    Time 8    Period Weeks    Status On-going                   Plan - 09/17/21 1321     Clinical Impression  Statement PT continued UE/periscapular strengthening this session. Pt is progressing with AROM and light resistance and is continuing to reach full shoulder flexion and approximately110 deg abuction bilaterally. Patient will continue to benefit from skilled therapy for exercise performance to ensure proper technique for safe and effective  progression. PT will progress UE loading as patient is able to tolerate.    Personal Factors and Comorbidities Age;Time since onset of injury/illness/exacerbation;Comorbidity 2;Comorbidity 1;Sex;Past/Current Experience    Comorbidities BC with radiation tx    Examination-Activity Limitations Bathing;Dressing;Hygiene/Grooming;Lift;Carry;Reach Overhead;Sleep    Examination-Participation Restrictions Volunteer;Community Activity;Cleaning;Laundry;Meal Prep;Driving;Shop    Stability/Clinical Decision Making Evolving/Moderate complexity    Clinical Decision Making Moderate    Rehab Potential Fair    PT Frequency 2x / week    PT Duration 8 weeks    PT Treatment/Interventions ADLs/Self Care Home Management;Cryotherapy;Electrical Stimulation;Iontophoresis 4mg /ml Dexamethasone;Traction;Therapeutic exercise;Neuromuscular re-education;Therapeutic activities;Patient/family education;Manual techniques;Passive range of motion;Dry needling;Energy conservation;Splinting;Taping;Spinal Manipulations    PT Next Visit Plan review HEP    PT Home Exercise Plan AROM, TROW    Consulted and Agree with Plan of Care Patient             Patient will benefit from skilled therapeutic intervention in order to improve the following deficits and impairments:  Decreased mobility, Pain, Postural dysfunction, Impaired UE functional use, Decreased strength, Decreased activity tolerance, Improper body mechanics, Decreased range of motion, Decreased endurance  Visit Diagnosis: Chronic right shoulder pain     Problem List Patient Active Problem List   Diagnosis Date Noted   Malignant  neoplasm of lower-outer quadrant of right breast of female, estrogen receptor positive ([place]) 05/06/2021   Paresthesia 04/24/2017   Bilateral shoulder pain 07/25/2016   Neck pain 07/25/2016   Sharion Settler, SPT  Sharion Settler, Student-PT 09/17/2021, 2:19 PM  [facility] [address] [place], [address] Phone: [phone]   Fax:  [phone]  Name: Raechel Marcos MRN: [id number] Date of Birth: 04-Aug-1946

## 2021-09-20 ENCOUNTER — Ambulatory Visit: Payer: Medicare Other | Admitting: Physical Therapy

## 2021-09-20 ENCOUNTER — Telehealth: Payer: Self-pay | Admitting: *Deleted

## 2021-09-20 ENCOUNTER — Encounter: Payer: Self-pay | Admitting: Physical Therapy

## 2021-09-20 DIAGNOSIS — M25511 Pain in right shoulder: Secondary | ICD-10-CM

## 2021-09-20 DIAGNOSIS — G8929 Other chronic pain: Secondary | ICD-10-CM

## 2021-09-20 NOTE — Therapy (Signed)
[facility] [address], [place], [address] Phone: [phone]   Fax:  [phone]  Physical Therapy Treatment  Patient Details  Name: Tracy Horne MRN: [id number] Date of Birth: 03-29-1946 No data recorded  Encounter Date: 09/20/2021   PT End of Session - 09/20/21 1304     Visit Number 12    Number of Visits 17    Date for PT Re-Evaluation 10/09/21    Authorization - Visit Number 2    Progress Note Due on Visit 10    PT Start Time 1119    PT Stop Time 1159    PT Time Calculation (min) 40 min    Activity Tolerance Patient tolerated treatment well    Behavior During Therapy [facility] for tasks assessed/performed             Past Medical History:  Diagnosis Date   Arthritis    shoulders and lower back per patient   Headache    Heart murmur    Hypertension    Thyroid disease     Past Surgical History:  Procedure Laterality Date   BREAST EXCISIONAL BIOPSY Left    BREAST LUMPECTOMY WITH RADIOACTIVE SEED LOCALIZATION Right 04/16/2021   Procedure: RIGHT BREAST LUMPECTOMY WITH RADIOACTIVE SEED LOCALIZATION;  Surgeon: Rolm Bookbinder, MD;  Location: [facility];  Service: General;  Laterality: Right;   CARPAL TUNNEL RELEASE Right    PILONIDAL CYST EXCISION     THYROID CYST EXCISION Right    TONSILLECTOMY     removed as a child    There were no vitals filed for this visit.   Subjective Assessment - 09/20/21 1123     Subjective Pt reports having some muscle soreness "fatigue" before starting this session. She denies any pain this visit.    Pertinent History Tracy Horne is a 75  y.o. female who presents to therapy with chronic R shoulder pain for several years. Reports pain has worsened following R breast radiation 05/28/21 - 06/28/21. Patient with history of R breast cancer since Oct 2021, with progression May 2022, lumpectomy completed 04/16/21, followed by radiation and aromatase inhibitor hormone  therapy for next 5 years (no chemotherapy). Pt with MRI Sept 12 2022 indicating full thickness tear of supinatus with tendon recoil, near full thickness tear of infraspinatus, and full thickness tear of superior half of subscap, making her a good candidate for reverse total shoulder- pt indicates she would like to put off surgery at this time for as long as function allows.  Patient reports increased pain with inability to lift her R arm to shoulder height. Patient reports increased pain with all arm movements with aggravating factors primarily with lifting and carrying. Patient demonstrates decreased ability to perform ADLs such as reaching to perform, self-care, preparing food, driving, and reaching for objects on a shelf at shoulder height. Pt reports using sub zero cream, massage cream, and meloxicam for pain relief. Pt enjoys volunteering for support groups and telephone/virtual outreach. Pt denies any unexplained weight fluctuation, saddle paresthesia, loss of bowel/bladder function, or unrelenting night pain at this time.  Pt has had MRI imaging confirming R RC tear of supraspinatus and infraspinatus muscles/tendons. Patient has a PMH breast cancer with last radiation tx on 06/28/21.    Limitations Lifting;House hold activities    How long can you sit comfortably? na    How long can you stand comfortably? na    How long can you walk comfortably? na  Diagnostic tests MRI sept 12 full thickness tear of supinatus with tendon recoil, near full thickness tear of infraspinatus, full thickness tear of superior half of subscap, severe tendoinosis and interstitial tearing of prox bicep tendon medially subluxed    Patient Stated Goals prevent surgery, increase motion, and decrease pain.            Therex  UBE L1 x 2 min FWD/BWD  Wall Balls with elbow Ext  2 x 20 sec clockwise/counterclockwise  @ shoulder height with cues for shoulder protraction and body alignment  R shoulder scaption with ball on  wall 2 x 10 reps   Tband Row RTB 3 x 10 reps with good carry over from previous session technique  Biceps Curl YTB 3x 10                            PT Education - 09/20/21 1125     Education Details therex form/technique    Person(s) Educated Patient    Methods Explanation;Demonstration    Comprehension Verbalized understanding;Returned demonstration              PT Short Term Goals - 09/13/21 1200       PT SHORT TERM GOAL #1   Title Pt will demonstrate independence with HEP to improve R shoulder function for increased ability to participate with ADLs    Baseline HEP given    Time 3    Period Weeks    Status Achieved               PT Long Term Goals - 09/13/21 1033       PT LONG TERM GOAL #1   Title Patient will increase FOTO score to 61 to demonstrate predicted increase in functional mobility to complete ADLs    Baseline 08/14/21 :41 09/13/21:60    Time 8    Period Weeks      PT LONG TERM GOAL #2   Title Patient will improve R shoulder flex/abd AROM to 110 degrees in sitting without pain greater than 5/10 on NPRS.    Baseline 08/14/21: Flexion: L95/ R 75deg : Abduction:L110/ R90 deg;09/13/21 Flexion:R 135deg : Abduction: 140 deg    Time 8    Period Weeks    Status On-going      PT LONG TERM GOAL #3   Title Patient will improve R shoulder strength to 4/5 or for abd and flexion within availible range in order to improve ability to perform self care and light household chores, utilizing compensatory musculature    Baseline 08/14/21: Flexion: -3/5, Abduction: 2+/5, 09/13/21: Flexion: 3/5, Abduction 4-/5    Time 8    Period Weeks    Status On-going                   Plan - 09/20/21 1305     Clinical Impression Statement Pt tolerated session fair but was limited by fatigue and pain. PT continued progressing patient AROM and light resistance. Pt self reports increased able to perform ADL task such cooking, cutting, and chopping.  Patient will continue to benefit from skilled therapy for exercise performance to ensure proper technique for safe and effective progression. PT will continue to progress UE loading to optimize functional mobility and strength as patient is able.    Personal Factors and Comorbidities Age;Time since onset of injury/illness/exacerbation;Comorbidity 2;Comorbidity 1;Sex;Past/Current Experience    Comorbidities BC with radiation tx    Examination-Activity Limitations Bathing;Dressing;Hygiene/Grooming;Lift;Carry;Reach Overhead;Sleep  Examination-Participation Restrictions Volunteer;Community Activity;Cleaning;Laundry;Meal Prep;Driving;Shop    Stability/Clinical Decision Making Evolving/Moderate complexity    Clinical Decision Making Moderate    Rehab Potential Fair    PT Frequency 2x / week    PT Treatment/Interventions ADLs/Self Care Home Management;Cryotherapy;Electrical Stimulation;Iontophoresis 4mg /ml Dexamethasone;Traction;Therapeutic exercise;Neuromuscular re-education;Therapeutic activities;Patient/family education;Manual techniques;Passive range of motion;Dry needling;Energy conservation;Splinting;Taping;Spinal Manipulations    PT Next Visit Plan UE loading    PT Home Exercise Plan AROM, TROW    Consulted and Agree with Plan of Care Patient             Patient will benefit from skilled therapeutic intervention in order to improve the following deficits and impairments:  Decreased mobility, Pain, Postural dysfunction, Impaired UE functional use, Decreased strength, Decreased activity tolerance, Improper body mechanics, Decreased range of motion, Decreased endurance  Visit Diagnosis: Chronic right shoulder pain     Problem List Patient Active Problem List   Diagnosis Date Noted   Malignant neoplasm of lower-outer quadrant of right breast of female, estrogen receptor positive ([place]) 05/06/2021   Paresthesia 04/24/2017   Bilateral shoulder pain 07/25/2016   Neck pain 07/25/2016      Durwin Reges DPT Durwin Reges, PT 09/21/2021, 10:08 AM  [facility] [address] [place], [address] Phone: [phone]   Fax:  [phone]  Name: Tracy Horne MRN: [id number] Date of Birth: 1946/04/03

## 2021-09-24 ENCOUNTER — Encounter: Payer: Medicare Other | Admitting: Physical Therapy

## 2021-09-25 ENCOUNTER — Encounter: Payer: Medicare Other | Admitting: Nurse Practitioner

## 2021-09-26 ENCOUNTER — Ambulatory Visit: Payer: Medicare Other | Admitting: Physical Therapy

## 2021-09-26 ENCOUNTER — Encounter: Payer: Self-pay | Admitting: Physical Therapy

## 2021-09-26 DIAGNOSIS — M25511 Pain in right shoulder: Secondary | ICD-10-CM | POA: Diagnosis not present

## 2021-09-26 DIAGNOSIS — G8929 Other chronic pain: Secondary | ICD-10-CM | POA: Diagnosis not present

## 2021-09-26 NOTE — Therapy (Signed)
[facility] [address] [place], [address] Phone: [phone]   Fax:  [phone]  Physical Therapy Treatment  Patient Details  Name: Tracy Horne MRN: [id number] Date of Birth: 23-Oct-1946 No data recorded  Encounter Date: 09/26/2021   PT End of Session - 09/26/21 1108     Visit Number 13    Number of Visits 17    Date for PT Re-Evaluation 10/09/21    Authorization - Visit Number 3    Progress Note Due on Visit 10    PT Start Time 1030    PT Stop Time 1108    PT Time Calculation (min) 38 min    Activity Tolerance Patient tolerated treatment well    Behavior During Therapy [facility] for tasks assessed/performed             Past Medical History:  Diagnosis Date   Arthritis    shoulders and lower back per patient   Headache    Heart murmur    Hypertension    Thyroid disease     Past Surgical History:  Procedure Laterality Date   BREAST EXCISIONAL BIOPSY Left    BREAST LUMPECTOMY WITH RADIOACTIVE SEED LOCALIZATION Right 04/16/2021   Procedure: RIGHT BREAST LUMPECTOMY WITH RADIOACTIVE SEED LOCALIZATION;  Surgeon: Rolm Bookbinder, MD;  Location: [facility];  Service: General;  Laterality: Right;   CARPAL TUNNEL RELEASE Right    PILONIDAL CYST EXCISION     THYROID CYST EXCISION Right    TONSILLECTOMY     removed as a child    There were no vitals filed for this visit.   Subjective Assessment - 09/26/21 1034     Subjective Pt reports having increased pain and difficulty pushing close object. She reports some soreness in her R shoulder.    Pertinent History Tracy Horne is a 75  y.o. female who presents to therapy with chronic R shoulder pain for several years. Reports pain has worsened following R breast radiation 05/28/21 - 06/28/21. Patient with history of R breast cancer since Oct 2021, with progression May 2022, lumpectomy completed 04/16/21, followed by radiation and aromatase inhibitor  hormone therapy for next 5 years (no chemotherapy). Pt with MRI Sept 12 2022 indicating full thickness tear of supinatus with tendon recoil, near full thickness tear of infraspinatus, and full thickness tear of superior half of subscap, making her a good candidate for reverse total shoulder- pt indicates she would like to put off surgery at this time for as long as function allows.  Patient reports increased pain with inability to lift her R arm to shoulder height. Patient reports increased pain with all arm movements with aggravating factors primarily with lifting and carrying. Patient demonstrates decreased ability to perform ADLs such as reaching to perform, self-care, preparing food, driving, and reaching for objects on a shelf at shoulder height. Pt reports using sub zero cream, massage cream, and meloxicam for pain relief. Pt enjoys volunteering for support groups and telephone/virtual outreach. Pt denies any unexplained weight fluctuation, saddle paresthesia, loss of bowel/bladder function, or unrelenting night pain at this time.  Pt has had MRI imaging confirming R RC tear of supraspinatus and infraspinatus muscles/tendons. Patient has a PMH breast cancer with last radiation tx on 06/28/21.    Limitations Lifting;House hold activities    How long can you sit comfortably? na    How long can you stand comfortably? na    How long can you walk comfortably? na  Diagnostic tests MRI sept 12 full thickness tear of supinatus with tendon recoil, near full thickness tear of infraspinatus, full thickness tear of superior half of subscap, severe tendoinosis and interstitial tearing of prox bicep tendon medially subluxed    Patient Stated Goals prevent surgery, increase motion, and decrease pain.            Therex   UBE  Seat 7 L2 x 2 min FWD/BWD  Y overhead raise with foam toller 1 x 8 reps; 1 x8 rep with YTB with cues for protraction/abd  Y Raises on Total Gym level 26  2 x 8 reps;  verbal and  tactile cues to achieve proper form    T Raises on Total Gym level 26 2 x 8 reps each with verbal and tactile cues    Door pushes 1 x 10 reps R UE only   Dynamic Hug 1 x 8 reps BUE; 1 x 8 R UE with cues for shoulder abduction (elbow elevation) with press.      PT Education - 09/26/21 1221     Education Details therex form/technique    Person(s) Educated Patient    Methods Explanation;Demonstration    Comprehension Verbalized understanding;Returned demonstration              PT Short Term Goals - 09/13/21 1200       PT SHORT TERM GOAL #1   Title Pt will demonstrate independence with HEP to improve R shoulder function for increased ability to participate with ADLs    Baseline HEP given    Time 3    Period Weeks    Status Achieved               PT Long Term Goals - 09/13/21 1033       PT LONG TERM GOAL #1   Title Patient will increase FOTO score to 61 to demonstrate predicted increase in functional mobility to complete ADLs    Baseline 08/14/21 :41 09/13/21:60    Time 8    Period Weeks      PT LONG TERM GOAL #2   Title Patient will improve R shoulder flex/abd AROM to 110 degrees in sitting without pain greater than 5/10 on NPRS.    Baseline 08/14/21: Flexion: L95/ R 75deg : Abduction:L110/ R90 deg;09/13/21 Flexion:R 135deg : Abduction: 140 deg    Time 8    Period Weeks    Status On-going      PT LONG TERM GOAL #3   Title Patient will improve R shoulder strength to 4/5 or for abd and flexion within availible range in order to improve ability to perform self care and light household chores, utilizing compensatory musculature    Baseline 08/14/21: Flexion: -3/5, Abduction: 2+/5, 09/13/21: Flexion: 3/5, Abduction 4-/5    Time 8    Period Weeks    Status On-going                   Plan - 09/26/21 1222     Clinical Impression Statement Pt tolerated session well with no reports of increased R shoulder pain following therex. PT continued progressing patient  R shoulder and periscapular strength this session with success. Patient will continue to benefit from skilled therapy for exercise performance to ensure proper technique for safe and effective progression. PT will continue to progress UE loading to optimize functional mobility and strength as able.    Personal Factors and Comorbidities Age;Time since onset of injury/illness/exacerbation;Comorbidity 2;Comorbidity 1;Sex;Past/Current Experience  Comorbidities BC with radiation tx    Examination-Activity Limitations Bathing;Dressing;Hygiene/Grooming;Lift;Carry;Reach Overhead;Sleep    Examination-Participation Restrictions Volunteer;Community Activity;Cleaning;Laundry;Meal Prep;Driving;Shop    Stability/Clinical Decision Making Evolving/Moderate complexity    Clinical Decision Making Moderate    Rehab Potential Fair    PT Frequency 2x / week    PT Duration 8 weeks    PT Treatment/Interventions ADLs/Self Care Home Management;Cryotherapy;Electrical Stimulation;Iontophoresis 4mg /ml Dexamethasone;Traction;Therapeutic exercise;Neuromuscular re-education;Therapeutic activities;Patient/family education;Manual techniques;Passive range of motion;Dry needling;Energy conservation;Splinting;Taping;Spinal Manipulations    PT Next Visit Plan UE loading    PT Home Exercise Plan AROM, TROW    Consulted and Agree with Plan of Care Patient             Patient will benefit from skilled therapeutic intervention in order to improve the following deficits and impairments:  Decreased mobility, Pain, Postural dysfunction, Impaired UE functional use, Decreased strength, Decreased activity tolerance, Improper body mechanics, Decreased range of motion, Decreased endurance  Visit Diagnosis: Chronic right shoulder pain     Problem List Patient Active Problem List   Diagnosis Date Noted   Malignant neoplasm of lower-outer quadrant of right breast of female, estrogen receptor positive ([place]) 05/06/2021   Paresthesia  04/24/2017   Bilateral shoulder pain 07/25/2016   Neck pain 07/25/2016      Durwin Reges DPT  Sharion Settler, SPT  Durwin Reges, PT 09/26/2021, 2:12 PM  [facility] [address], [place], [address] Phone: [phone]   Fax:  [phone]  Name: Marletta Bousquet MRN: [id number] Date of Birth: [birth year]

## 2021-09-28 ENCOUNTER — Other Ambulatory Visit: Payer: Self-pay

## 2021-09-28 ENCOUNTER — Encounter: Payer: Self-pay | Admitting: Physical Therapy

## 2021-09-28 ENCOUNTER — Ambulatory Visit: Payer: Medicare Other | Admitting: Physical Therapy

## 2021-09-28 DIAGNOSIS — G8929 Other chronic pain: Secondary | ICD-10-CM

## 2021-09-28 DIAGNOSIS — M25511 Pain in right shoulder: Secondary | ICD-10-CM | POA: Diagnosis not present

## 2021-09-28 NOTE — Therapy (Signed)
[facility] [address] [place], [address] Phone: [phone]   Fax:  [phone]  Physical Therapy Treatment  Patient Details  Name: Tracy Horne MRN: [id number] Date of Birth: October 13, 1946 No data recorded  Encounter Date: 09/28/2021   PT End of Session - 09/28/21 1026     Visit Number 14    Number of Visits 17    Date for PT Re-Evaluation 10/09/21    Authorization - Visit Number 4    Progress Note Due on Visit 10    PT Start Time 1020    PT Stop Time 1058    PT Time Calculation (min) 38 min    Activity Tolerance Patient tolerated treatment well    Behavior During Therapy WFL for tasks assessed/performed             Past Medical History:  Diagnosis Date   Arthritis    shoulders and lower back per patient   Headache    Heart murmur    Hypertension    Thyroid disease     Past Surgical History:  Procedure Laterality Date   BREAST EXCISIONAL BIOPSY Left    BREAST LUMPECTOMY WITH RADIOACTIVE SEED LOCALIZATION Right 04/16/2021   Procedure: RIGHT BREAST LUMPECTOMY WITH RADIOACTIVE SEED LOCALIZATION;  Surgeon: Rolm Bookbinder, MD;  Location: [facility];  Service: General;  Laterality: Right;   CARPAL TUNNEL RELEASE Right    PILONIDAL CYST EXCISION     THYROID CYST EXCISION Right    TONSILLECTOMY     removed as a child    There were no vitals filed for this visit.   Subjective Assessment - 09/28/21 1022     Subjective Pt reports feeling well today and ability to clean off a bookshelf demonstrating improved functional ability.    Pertinent History Tracy Horne is a 75  y.o. female who presents to therapy with chronic R shoulder pain for several years. Reports pain has worsened following R breast radiation 05/28/21 - 06/28/21. Patient with history of R breast cancer since Oct 2021, with progression May 2022, lumpectomy completed 04/16/21, followed by radiation and aromatase inhibitor hormone  therapy for next 5 years (no chemotherapy). Pt with MRI Sept 12 2022 indicating full thickness tear of supinatus with tendon recoil, near full thickness tear of infraspinatus, and full thickness tear of superior half of subscap, making her a good candidate for reverse total shoulder- pt indicates she would like to put off surgery at this time for as long as function allows.  Patient reports increased pain with inability to lift her R arm to shoulder height. Patient reports increased pain with all arm movements with aggravating factors primarily with lifting and carrying. Patient demonstrates decreased ability to perform ADLs such as reaching to perform, self-care, preparing food, driving, and reaching for objects on a shelf at shoulder height. Pt reports using sub zero cream, massage cream, and meloxicam for pain relief. Pt enjoys volunteering for support groups and telephone/virtual outreach. Pt denies any unexplained weight fluctuation, saddle paresthesia, loss of bowel/bladder function, or unrelenting night pain at this time.  Pt has had MRI imaging confirming R RC tear of supraspinatus and infraspinatus muscles/tendons. Patient has a PMH breast cancer with last radiation tx on 06/28/21.    Limitations Lifting;House hold activities    How long can you sit comfortably? na    How long can you stand comfortably? na    How long can you walk comfortably? na    Diagnostic  tests MRI sept 12 full thickness tear of supinatus with tendon recoil, near full thickness tear of infraspinatus, full thickness tear of superior half of subscap, severe tendoinosis and interstitial tearing of prox bicep tendon medially subluxed    Patient Stated Goals prevent surgery, increase motion, and decrease pain.             Therex   UBE Seat 7 L2 x 2 min FWD/BWD  Theraband Row 3 x 10 GTB with cues to perform scapular retraction    Y Raises on wall 2 x 8 reps;  verbal and tactile cues to achieve proper form    Wall Balls 1  kg with elbow Ext  2 x 20 sec clockwise/counterclockwise  @ shoulder height & above shoulder height with cues for shoulder protraction and body alignment   Biceps Curl YTB 3x 10 reps with cues for form               PT Education - 09/28/21 1025     Education Details therex form/technique    Person(s) Educated Patient    Methods Explanation;Demonstration    Comprehension Verbalized understanding;Returned demonstration              PT Short Term Goals - 09/13/21 1200       PT SHORT TERM GOAL #1   Title Pt will demonstrate independence with HEP to improve R shoulder function for increased ability to participate with ADLs    Baseline HEP given    Time 3    Period Weeks    Status Achieved               PT Long Term Goals - 09/13/21 1033       PT LONG TERM GOAL #1   Title Patient will increase FOTO score to 61 to demonstrate predicted increase in functional mobility to complete ADLs    Baseline 08/14/21 :41 09/13/21:60    Time 8    Period Weeks      PT LONG TERM GOAL #2   Title Patient will improve R shoulder flex/abd AROM to 110 degrees in sitting without pain greater than 5/10 on NPRS.    Baseline 08/14/21: Flexion: L95/ R 75deg : Abduction:L110/ R90 deg;09/13/21 Flexion:R 135deg : Abduction: 140 deg    Time 8    Period Weeks    Status On-going      PT LONG TERM GOAL #3   Title Patient will improve R shoulder strength to 4/5 or for abd and flexion within availible range in order to improve ability to perform self care and light household chores, utilizing compensatory musculature    Baseline 08/14/21: Flexion: -3/5, Abduction: 2+/5, 09/13/21: Flexion: 3/5, Abduction 4-/5    Time 8    Period Weeks    Status On-going                   Plan - 09/28/21 1034     Clinical Impression Statement PT continued progressing patient R shoulder and periscapular strength this session with success. Pt tolerated session well with no reports of increased R shoulder  pain following therex. Patient will continue to benefit from skilled therapy for exercise performance to ensure proper technique for safe and effective progression. PT will continue to progress UE loading to optimize functional mobility and strength as able.    Personal Factors and Comorbidities Age;Time since onset of injury/illness/exacerbation;Comorbidity 2;Comorbidity 1;Sex;Past/Current Experience    Comorbidities BC with radiation tx    Examination-Activity Limitations Bathing;Dressing;Hygiene/Grooming;Lift;Carry;Reach Overhead;Sleep  Examination-Participation Restrictions Volunteer;Community Activity;Cleaning;Laundry;Meal Prep;Driving;Shop    Stability/Clinical Decision Making Evolving/Moderate complexity    Clinical Decision Making Moderate    Rehab Potential Fair    PT Frequency 2x / week    PT Duration 8 weeks    PT Treatment/Interventions ADLs/Self Care Home Management;Cryotherapy;Electrical Stimulation;Iontophoresis 4mg /ml Dexamethasone;Traction;Therapeutic exercise;Neuromuscular re-education;Therapeutic activities;Patient/family education;Manual techniques;Passive range of motion;Dry needling;Energy conservation;Splinting;Taping;Spinal Manipulations    PT Next Visit Plan UE loading    PT Home Exercise Plan AROM, TROW    Consulted and Agree with Plan of Care Patient             Patient will benefit from skilled therapeutic intervention in order to improve the following deficits and impairments:  Decreased mobility, Pain, Postural dysfunction, Impaired UE functional use, Decreased strength, Decreased activity tolerance, Improper body mechanics, Decreased range of motion, Decreased endurance  Visit Diagnosis: Chronic right shoulder pain     Problem List Patient Active Problem List   Diagnosis Date Noted   Malignant neoplasm of lower-outer quadrant of right breast of female, estrogen receptor positive ([place]) 05/06/2021   Paresthesia 04/24/2017   Bilateral shoulder pain  07/25/2016   Neck pain 07/25/2016     Durwin Reges DPT Sharion Settler, SPT  Durwin Reges, PT 09/28/2021, 11:08 AM  [facility] [address], [place], [address] Phone: [phone]   Fax:  [phone]  Name: Tracy Horne MRN: [id number] Date of Birth: 08/17/1946

## 2021-10-01 ENCOUNTER — Ambulatory Visit: Payer: Medicare Other | Admitting: Physical Therapy

## 2021-10-01 ENCOUNTER — Encounter: Payer: Self-pay | Admitting: Physical Therapy

## 2021-10-01 DIAGNOSIS — M25511 Pain in right shoulder: Secondary | ICD-10-CM | POA: Diagnosis not present

## 2021-10-01 DIAGNOSIS — G8929 Other chronic pain: Secondary | ICD-10-CM | POA: Diagnosis not present

## 2021-10-01 NOTE — Therapy (Signed)
[facility] [address], [place], [address] Phone: [phone]   Fax:  [phone]  Physical Therapy Treatment  Patient Details  Name: Tracy Horne MRN: [id number] Date of Birth: 1946-01-17 No data recorded  Encounter Date: 10/01/2021   PT End of Session - 10/01/21 1127     Visit Number 15    Number of Visits 17    Date for PT Re-Evaluation 10/09/21    Authorization - Visit Number 5    Progress Note Due on Visit 10    PT Start Time 1122    PT Stop Time 1200    PT Time Calculation (min) 38 min    Activity Tolerance Patient tolerated treatment well    Behavior During Therapy [facility] for tasks assessed/performed             Past Medical History:  Diagnosis Date   Arthritis    shoulders and lower back per patient   Headache    Heart murmur    Hypertension    Thyroid disease     Past Surgical History:  Procedure Laterality Date   BREAST EXCISIONAL BIOPSY Left    BREAST LUMPECTOMY WITH RADIOACTIVE SEED LOCALIZATION Right 04/16/2021   Procedure: RIGHT BREAST LUMPECTOMY WITH RADIOACTIVE SEED LOCALIZATION;  Surgeon: Rolm Bookbinder, MD;  Location: [facility];  Service: General;  Laterality: Right;   CARPAL TUNNEL RELEASE Right    PILONIDAL CYST EXCISION     THYROID CYST EXCISION Right    TONSILLECTOMY     removed as a child    There were no vitals filed for this visit.   Subjective Assessment - 10/01/21 1124     Subjective Pt rpeorts she was more sore following last session. Reports she tried to do her HEP and move her arm "regularly". She reports 7/10 pain today. No medical changes since last visit.    Pertinent History Tracy Horne is a 75  y.o. female who presents to therapy with chronic R shoulder pain for several years. Reports pain has worsened following R breast radiation 05/28/21 - 06/28/21. Patient with history of R breast cancer since Oct 2021, with progression May 2022, lumpectomy  completed 04/16/21, followed by radiation and aromatase inhibitor hormone therapy for next 5 years (no chemotherapy). Pt with MRI Sept 12 2022 indicating full thickness tear of supinatus with tendon recoil, near full thickness tear of infraspinatus, and full thickness tear of superior half of subscap, making her a good candidate for reverse total shoulder- pt indicates she would like to put off surgery at this time for as long as function allows.  Patient reports increased pain with inability to lift her R arm to shoulder height. Patient reports increased pain with all arm movements with aggravating factors primarily with lifting and carrying. Patient demonstrates decreased ability to perform ADLs such as reaching to perform, self-care, preparing food, driving, and reaching for objects on a shelf at shoulder height. Pt reports using sub zero cream, massage cream, and meloxicam for pain relief. Pt enjoys volunteering for support groups and telephone/virtual outreach. Pt denies any unexplained weight fluctuation, saddle paresthesia, loss of bowel/bladder function, or unrelenting night pain at this time.  Pt has had MRI imaging confirming R RC tear of supraspinatus and infraspinatus muscles/tendons. Patient has a PMH breast cancer with last radiation tx on 06/28/21.    Limitations Lifting;House hold activities    How long can you sit comfortably? na    How long can you stand  comfortably? na    How long can you walk comfortably? na    Diagnostic tests MRI sept 12 full thickness tear of supinatus with tendon recoil, near full thickness tear of infraspinatus, full thickness tear of superior half of subscap, severe tendoinosis and interstitial tearing of prox bicep tendon medially subluxed    Pain Onset More than a month ago            Therex   UBE Seat 7 L2 x 2 min FWD/BWD  Seated aarom with dowel flex with 1/2 roll at tspine x12 with cuing for LUE to aid in control of lower or RUE as well as raise  Seated  forward flex with xlarge tball for increased thoracic ext x12 with min cuing for breath control   Theraband Row 2 x 10 GTB with cues to perform scapular retraction    Posterior capsule stretch x30sec                               PT Education - 10/01/21 1126     Education Details therex form/technique    Person(s) Educated Patient    Methods Explanation;Demonstration;Verbal cues    Comprehension Verbalized understanding;Returned demonstration;Verbal cues required              PT Short Term Goals - 09/13/21 1200       PT SHORT TERM GOAL #1   Title Pt will demonstrate independence with HEP to improve R shoulder function for increased ability to participate with ADLs    Baseline HEP given    Time 3    Period Weeks    Status Achieved               PT Long Term Goals - 09/13/21 1033       PT LONG TERM GOAL #1   Title Patient will increase FOTO score to 61 to demonstrate predicted increase in functional mobility to complete ADLs    Baseline 08/14/21 :41 09/13/21:60    Time 8    Period Weeks      PT LONG TERM GOAL #2   Title Patient will improve R shoulder flex/abd AROM to 110 degrees in sitting without pain greater than 5/10 on NPRS.    Baseline 08/14/21: Flexion: L95/ R 75deg : Abduction:L110/ R90 deg;09/13/21 Flexion:R 135deg : Abduction: 140 deg    Time 8    Period Weeks    Status On-going      PT LONG TERM GOAL #3   Title Patient will improve R shoulder strength to 4/5 or for abd and flexion within availible range in order to improve ability to perform self care and light household chores, utilizing compensatory musculature    Baseline 08/14/21: Flexion: -3/5, Abduction: 2+/5, 09/13/21: Flexion: 3/5, Abduction 4-/5    Time 8    Period Weeks    Status On-going                   Plan - 10/01/21 1154     Clinical Impression Statement PT regressed therex progression slightly this session d/t increased soreness following last  session. Session focused on mobility with efficient scapulohumeral rhythm and periscapular strengthening with success. Tracy Horne tis able to comply with all cuing for proper technique of therex with good motivation throughout session. Patient reports no increase in pain throughout session, but continued soreness at the end of session. PT advised patient in stretching and heat for pain reduction with  good undersatnding. PT will continue progression as able.    Personal Factors and Comorbidities Age;Time since onset of injury/illness/exacerbation;Comorbidity 2;Comorbidity 1;Sex;Past/Current Experience    Comorbidities BC with radiation tx    Examination-Activity Limitations Bathing;Dressing;Hygiene/Grooming;Lift;Carry;Reach Overhead;Sleep    Examination-Participation Restrictions Volunteer;Community Activity;Cleaning;Laundry;Meal Prep;Driving;Shop    Stability/Clinical Decision Making Evolving/Moderate complexity    Clinical Decision Making Moderate    Rehab Potential Fair    PT Frequency 2x / week    PT Duration 8 weeks    PT Treatment/Interventions ADLs/Self Care Home Management;Cryotherapy;Electrical Stimulation;Iontophoresis 4mg /ml Dexamethasone;Traction;Therapeutic exercise;Neuromuscular re-education;Therapeutic activities;Patient/family education;Manual techniques;Passive range of motion;Dry needling;Energy conservation;Splinting;Taping;Spinal Manipulations    PT Next Visit Plan UE loading    PT Home Exercise Plan AROM, TROW    Consulted and Agree with Plan of Care Patient             Patient will benefit from skilled therapeutic intervention in order to improve the following deficits and impairments:  Decreased mobility, Pain, Postural dysfunction, Impaired UE functional use, Decreased strength, Decreased activity tolerance, Improper body mechanics, Decreased range of motion, Decreased endurance  Visit Diagnosis: Chronic right shoulder pain     Problem List Patient Active Problem List    Diagnosis Date Noted   Malignant neoplasm of lower-outer quadrant of right breast of female, estrogen receptor positive ([place]) 05/06/2021   Paresthesia 04/24/2017   Bilateral shoulder pain 07/25/2016   Neck pain 07/25/2016   Durwin Reges DPT Durwin Reges, PT 10/01/2021, 12:02 PM  [facility] [address] [place], [address] Phone: [phone]   Fax:  [phone]  Name: Cammy Sanjurjo MRN: [id number] Date of Birth: 1946/03/24

## 2021-10-02 ENCOUNTER — Telehealth: Payer: Self-pay | Admitting: *Deleted

## 2021-10-05 ENCOUNTER — Other Ambulatory Visit: Payer: Self-pay | Admitting: Hematology

## 2021-10-08 ENCOUNTER — Ambulatory Visit: Payer: Medicare Other | Admitting: Physical Therapy

## 2021-10-09 ENCOUNTER — Encounter: Payer: Self-pay | Admitting: Physical Therapy

## 2021-10-09 ENCOUNTER — Other Ambulatory Visit: Payer: Self-pay

## 2021-10-09 ENCOUNTER — Ambulatory Visit: Payer: Medicare Other | Admitting: Physical Therapy

## 2021-10-09 DIAGNOSIS — M25511 Pain in right shoulder: Secondary | ICD-10-CM | POA: Diagnosis not present

## 2021-10-09 DIAGNOSIS — G8929 Other chronic pain: Secondary | ICD-10-CM | POA: Diagnosis not present

## 2021-10-09 NOTE — Therapy (Signed)
[facility] [address], [place], [address] Phone: [phone]   Fax:  [phone]  Physical Therapy Treatment/Discharge Summary Reporting Period 09/13/21 - 10/09/21  Patient Details  Name: Tracy Horne MRN: [id number] Date of Birth: 1946/09/04 No data recorded  Encounter Date: 10/09/2021   PT End of Session - 10/09/21 1158     Visit Number 16    Number of Visits 17    Date for PT Re-Evaluation 10/09/21    Authorization - Visit Number 6    Progress Note Due on Visit 10    PT Start Time 1115    PT Stop Time 1153    PT Time Calculation (min) 38 min    Activity Tolerance Patient tolerated treatment well    Behavior During Therapy [facility] for tasks assessed/performed             Past Medical History:  Diagnosis Date   Arthritis    shoulders and lower back per patient   Headache    Heart murmur    Hypertension    Thyroid disease     Past Surgical History:  Procedure Laterality Date   BREAST EXCISIONAL BIOPSY Left    BREAST LUMPECTOMY WITH RADIOACTIVE SEED LOCALIZATION Right 04/16/2021   Procedure: RIGHT BREAST LUMPECTOMY WITH RADIOACTIVE SEED LOCALIZATION;  Surgeon: Emelia Loron, MD;  Location: [facility];  Service: General;  Laterality: Right;   CARPAL TUNNEL RELEASE Right    PILONIDAL CYST EXCISION     THYROID CYST EXCISION Right    TONSILLECTOMY     removed as a child    There were no vitals filed for this visit.   Subjective Assessment - 10/09/21 1120     Subjective Patient reports she has had some increased tension due to being stressed over family loss and funeral arrangements. Reports pain is 7/10 today.    Pertinent History Tracy Horne is a 75  y.o. female who presents to therapy with chronic R shoulder pain for several years. Reports pain has worsened following R breast radiation 05/28/21 - 06/28/21. Patient with history of R breast cancer since Oct 2021, with progression May  2022, lumpectomy completed 04/16/21, followed by radiation and aromatase inhibitor hormone therapy for next 5 years (no chemotherapy). Pt with MRI Sept 12 2022 indicating full thickness tear of supinatus with tendon recoil, near full thickness tear of infraspinatus, and full thickness tear of superior half of subscap, making her a good candidate for reverse total shoulder- pt indicates she would like to put off surgery at this time for as long as function allows.  Patient reports increased pain with inability to lift her R arm to shoulder height. Patient reports increased pain with all arm movements with aggravating factors primarily with lifting and carrying. Patient demonstrates decreased ability to perform ADLs such as reaching to perform, self-care, preparing food, driving, and reaching for objects on a shelf at shoulder height. Pt reports using sub zero cream, massage cream, and meloxicam for pain relief. Pt enjoys volunteering for support groups and telephone/virtual outreach. Pt denies any unexplained weight fluctuation, saddle paresthesia, loss of bowel/bladder function, or unrelenting night pain at this time.  Pt has had MRI imaging confirming R RC tear of supraspinatus and infraspinatus muscles/tendons. Patient has a PMH breast cancer with last radiation tx on 06/28/21.    Limitations Lifting;House hold activities    How long can you sit comfortably? na    How long can you stand comfortably? na  How long can you walk comfortably? na    Diagnostic tests MRI sept 12 full thickness tear of supinatus with tendon recoil, near full thickness tear of infraspinatus, full thickness tear of superior half of subscap, severe tendoinosis and interstitial tearing of prox bicep tendon medially subluxed    Patient Stated Goals prevent surgery, increase motion, and decrease pain.    Pain Onset More than a month ago              Therex   UBE Seat 7 L2 x 2 min FWD/BWD    PT reviewed the following HEP with  patient with patient able to demonstrate a set of the following with min cuing for correction needed. PT educated patient on parameters of therex (how/when to inc/decrease intensity, frequency, rep/set range, stretch hold time, and purpose of therex) with verbalized understanding.  Access Code: [email or web address] URL: [email or web address] Date: 10/09/2021 Prepared by: Durwin Reges  Exercises Standing Row with Anchored Resistance - 1 x daily - 2 x weekly - 3 sets - 6-10 reps Shoulder extension with resistance - Neutral - 1 x daily - 2 x weekly - 3 sets - 6-10 reps Shoulder External Rotation with Anchored Resistance - 1 x daily - 2 x weekly - 3 sets - 6-10 reps Seated Chair Push Ups - 1 x daily - 2 x weekly - 3 sets - 6-10 reps Shoulder Abduction with Dumbbells - Palms Down - 1 x daily - 2 x weekly - 3 sets - 6-10 reps                           PT Education - 10/09/21 1157     Education Details therex form/technique    Person(s) Educated Patient    Methods Explanation;Demonstration;Verbal cues    Comprehension Verbalized understanding;Returned demonstration;Verbal cues required              PT Short Term Goals - 09/13/21 1200       PT SHORT TERM GOAL #1   Title Pt will demonstrate independence with HEP to improve R shoulder function for increased ability to participate with ADLs    Baseline HEP given    Time 3    Period Weeks    Status Achieved               PT Long Term Goals - 10/09/21 1124       PT LONG TERM GOAL #1   Title Patient will increase FOTO score to 61 to demonstrate predicted increase in functional mobility to complete ADLs    Baseline 08/14/21 :41 09/13/21:60 10/08/21 70    Time 8    Period Weeks    Status Achieved      PT LONG TERM GOAL #2   Title Patient will improve R shoulder flex/abd AROM to 110 degrees in sitting without pain greater than 5/10 on NPRS.    Baseline 08/14/21: Flexion: L95/ R 75deg : Abduction:L110/  R90 deg;09/13/21 Flexion:R 135deg : Abduction: 140 deg; 10/09/21 Flexion:R 170deg : Abduction: 160 deg with 4/10 pain    Time 8    Period Weeks    Status Achieved      PT LONG TERM GOAL #3   Title Patient will improve R shoulder strength to 4/5 or for abd and flexion within availible range in order to improve ability to perform self care and light household chores, utilizing compensatory musculature    Baseline 08/14/21: Flexion: -3/5, Abduction: 2+/5, 09/13/21: Flexion:  3/5, Abduction 4-/5; 10/09/21 flex, abd 4+/5; IR 5/5; ER 4-/5    Time 8    Period Days    Status Achieved                   Plan - 10/09/21 1158     Clinical Impression Statement PT reassessed goals this session where patient has met all goals to safely d.c skilled PT services to robust HEP. Pt is able to demonstrate and verbalize undersatnding of HEP exercises and education on progression/regression, and parameters for continued strengthening. Pt to d/c PT.    Personal Factors and Comorbidities Age;Time since onset of injury/illness/exacerbation;Comorbidity 2;Comorbidity 1;Sex;Past/Current Experience    Comorbidities BC with radiation tx             Patient will benefit from skilled therapeutic intervention in order to improve the following deficits and impairments:     Visit Diagnosis: Chronic right shoulder pain     Problem List Patient Active Problem List   Diagnosis Date Noted   Malignant neoplasm of lower-outer quadrant of right breast of female, estrogen receptor positive ([place]) 05/06/2021   Paresthesia 04/24/2017   Bilateral shoulder pain 07/25/2016   Neck pain 07/25/2016   Durwin Reges DPT Durwin Reges, PT 10/09/2021, 1:00 PM  [place] [facility] [address], [place], [address] Phone: [phone]   Fax:  [phone]  Name: Tracy H[facility] MRN: [id number] Date of Birth: September 24, 1946

## 2021-10-10 ENCOUNTER — Telehealth: Payer: Self-pay | Admitting: Hematology

## 2021-10-10 NOTE — Telephone Encounter (Signed)
Rescheduled past appointment per 11/15 staff message. Patient is aware of changes. Mailed calendar.

## 2021-10-11 ENCOUNTER — Ambulatory Visit: Payer: Medicare Other | Admitting: Physical Therapy

## 2021-10-30 ENCOUNTER — Other Ambulatory Visit: Payer: Self-pay

## 2021-10-30 ENCOUNTER — Inpatient Hospital Stay: Payer: Medicare Other | Attending: Nurse Practitioner | Admitting: Nurse Practitioner

## 2021-10-30 ENCOUNTER — Encounter: Payer: Self-pay | Admitting: Nurse Practitioner

## 2021-10-30 VITALS — BP 157/87 | HR 66 | Temp 97.2°F | Resp 20 | Wt 141.8 lb

## 2021-10-30 DIAGNOSIS — I10 Essential (primary) hypertension: Secondary | ICD-10-CM | POA: Diagnosis not present

## 2021-10-30 DIAGNOSIS — Z79899 Other long term (current) drug therapy: Secondary | ICD-10-CM | POA: Diagnosis not present

## 2021-10-30 DIAGNOSIS — N644 Mastodynia: Secondary | ICD-10-CM

## 2021-10-30 DIAGNOSIS — Z79811 Long term (current) use of aromatase inhibitors: Secondary | ICD-10-CM | POA: Insufficient documentation

## 2021-10-30 DIAGNOSIS — E079 Disorder of thyroid, unspecified: Secondary | ICD-10-CM | POA: Diagnosis not present

## 2021-10-30 DIAGNOSIS — M8580 Other specified disorders of bone density and structure, unspecified site: Secondary | ICD-10-CM | POA: Diagnosis not present

## 2021-10-30 DIAGNOSIS — Z923 Personal history of irradiation: Secondary | ICD-10-CM | POA: Insufficient documentation

## 2021-10-30 DIAGNOSIS — C50511 Malignant neoplasm of lower-outer quadrant of right female breast: Secondary | ICD-10-CM | POA: Diagnosis not present

## 2021-10-30 DIAGNOSIS — Z170 Estrogen receptor positive status [ER+]: Secondary | ICD-10-CM | POA: Insufficient documentation

## 2021-10-30 MED ORDER — ANASTROZOLE 1 MG PO TABS: ORAL_TABLET | ORAL | 2 refills | Status: DC

## 2021-10-30 NOTE — Progress Notes (Signed)
CLINIC:  Survivorship   Patient Care Team: Deland Pretty, MD as PCP - General (Internal Medicine) Valinda Party, MD (Rheumatology) Rockwell Germany, RN as Oncology Nurse Navigator Mauro Kaufmann, RN as Oncology Nurse Navigator Rolm Bookbinder, MD as Consulting Physician (General Surgery) Alla Feeling, NP as Nurse Practitioner (Nurse Practitioner) Truitt Merle, MD as Consulting Physician (Hematology)   REASON FOR VISIT:  Routine follow-up post-treatment for a recent history of breast cancer.  BRIEF ONCOLOGIC HISTORY:  Oncology History Overview Note  Cancer Staging Malignant neoplasm of lower-outer quadrant of right breast of female, estrogen receptor positive ([place]) Staging form: Breast, AJCC 8th Edition - Pathologic stage from 04/16/2021: Stage Unknown (pT1a, pNX, G1, ER+, PR-, HER2+) - Signed by Truitt Merle, MD on 05/06/2021 Stage prefix: Initial diagnosis Multigene prognostic tests performed: None Histologic grading system: 3 grade system Residual tumor (R): R0 - None    Malignant neoplasm of lower-outer quadrant of right breast of female, estrogen receptor positive ([place])  08/16/2020 Pathology Results   Diagnosis Breast, right, needle core biopsy - LOBULAR NEOPLASIA (ATYPICAL LOBULAR HYPERPLASIA) WITH MICROCALCIFICATIONS - SEE COMMENT Microscopic Comment These results were called to [facility] on August 17, 2020.   03/13/2021 Mammogram   Right mammogram 03/13/21 There are pleomorphic calcifications spanning 1.6cm in the slightly linear configuration within the right lower outer breast anterior to middle depth 2.7cm from the nipple. These are increased in number. No other significant masses or calcifications.    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 - LOBULAR NEOPLASIA (ATYPICAL LOBULAR  HYPERPLASIA). Microscopic Comment The carcinoma appears high grade. E-cadherin is pending and will be reported in an addendum. Dr. Saralyn Pilar has reviewed the case. The case was called to Dr. Luan Pulling on 03/21/2021.  E-cadherin is positive in the areas of in situ carcinoma and thus, these areas are consistent with high grade ductal carcinoma in situ. E-cadherin is negative in areas of atypical lobular hyperplasia.   04/16/2021 Cancer Staging   Staging form: Breast, AJCC 8th Edition - Pathologic stage from 04/16/2021: Stage Unknown (pT1a, pNX, G1, ER+, PR-, HER2+) - Signed by Truitt Merle, MD on 05/06/2021 Stage prefix: Initial diagnosis Multigene prognostic tests performed: None Histologic grading system: 3 grade system Residual tumor (R): R0 - None    04/16/2021 Surgery   RIGHT BREAST LUMPECTOMY WITH RADIOACTIVE SEED LOCALIZATION by Dr Donne Hazel    FINAL MICROSCOPIC DIAGNOSIS:   A. 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   All controls stained appropriately.    05/06/2021 Initial Diagnosis   Malignant neoplasm of lower-outer quadrant of  right breast of female, estrogen receptor positive ([place])   05/24/2021 - 06/28/2021 Radiation Therapy   Whole breast radiation by Dr. Noreene Filbert    07/2021 -  Anti-estrogen oral therapy   Adjuvant Anastrozole    10/30/2021 Survivorship   SCP delivered by Cira Rue, NP     INTERVAL HISTORY:  Ms. Adami presents to the [facility] today for our initial meeting to review her survivorship care plan detailing her treatment course for breast cancer, as well as monitoring long-term side effects of that treatment, education regarding health maintenance, screening, and overall wellness and health promotion.     Overall, Ms. Defrain is doing well, she has occasional short surgical related breast pains on the right.  In the last 1-2 weeks she developed a new/different dull aching pain in the upper inner right breast.  Denies lump/mass, nipple discharge or inversion, or skin change.  Denies fall/trauma or injury.  She is doing well on anastrozole with mild tolerable hot flashes mainly from 3-5 AM.  Denies bone or joint pain.  She completed therapy for her right shoulder, pain and range of motion improved.  She has increased blood pressure today, only took her amlodipine. She has occasional temporal headaches, denies vision change.  Denies abdominal pain or bloating, nausea/vomiting, recent weight loss or fatigue, or any other new specific complaints.   ONCOLOGY TREATMENT TEAM:  1. Surgeon:  Dr. Donne Hazel at [facility] 2. Medical Oncologist: Dr. Burr Medico 3. Radiation Oncologist: Dr. Donella Stade    PAST MEDICAL/SURGICAL HISTORY:  Past Medical History:  Diagnosis Date   Arthritis    shoulders and lower back per patient   Headache    Heart murmur    Hypertension    Thyroid disease    Past Surgical History:  Procedure Laterality Date   BREAST EXCISIONAL BIOPSY Left    BREAST LUMPECTOMY WITH RADIOACTIVE SEED LOCALIZATION Right 04/16/2021   Procedure: RIGHT BREAST LUMPECTOMY WITH RADIOACTIVE SEED LOCALIZATION;  Surgeon: Rolm Bookbinder, MD;  Location: [facility];  Service: General;  Laterality: Right;   CARPAL TUNNEL RELEASE Right    PILONIDAL CYST EXCISION     THYROID CYST EXCISION Right     TONSILLECTOMY     removed as a child     ALLERGIES:  Allergies  Allergen Reactions   Alendronate Sodium Other (See Comments)   Felodipine     Anxiety    Vitamin D      CURRENT MEDICATIONS:  Outpatient Encounter Medications as of 10/30/2021  Medication Sig   acetaminophen (TYLENOL) 325 MG tablet Take 650 mg by mouth every 6 (six) hours as needed for moderate pain.   amLODipine (NORVASC) 2.5 MG tablet Take 2.5 mg by mouth daily.   anastrozole (ARIMIDEX) 1 MG tablet TAKE 1 TABLET(1 MG) BY MOUTH DAILY   aspirin EC 81 MG tablet Take 81 mg by mouth every other day. Swallow whole.   calcium carbonate (TUMS - DOSED IN MG ELEMENTAL CALCIUM) 500 MG chewable tablet Chew 1 tablet by mouth daily as needed for indigestion or heartburn.   cholecalciferol (VITAMIN D3) 25 MCG (1000 UNIT) tablet Take 1,000 Units by mouth daily.   meloxicam (MOBIC) 15 MG tablet Take 15 mg by mouth daily.   Multiple Vitamins-Minerals (HAIR FORMULA EXTRA STRENGTH PO) Take 1 tablet by mouth daily.   Omega-3 1000 MG CAPS Take 1,000 mg by mouth daily.   OVER THE COUNTER MEDICATION Apply 1 application topically at bedtime as needed (pain). Magnesium Cream   Propylene Glycol (SYSTANE  BALANCE) 0.6 % SOLN Place 1 drop into both eyes daily as needed (dry eyes).   telmisartan-hydrochlorothiazide (MICARDIS HCT) 80-12.5 MG tablet Take 1 tablet by mouth daily.   valACYclovir (VALTREX) 500 MG tablet TAKE 2 TABLETS(1000 MG) BY MOUTH TWICE DAILY (Patient taking differently: Take 500 mg by mouth 2 (two) times daily as needed (outbreaks).)   [DISCONTINUED] anastrozole (ARIMIDEX) 1 MG tablet TAKE 1 TABLET(1 MG) BY MOUTH DAILY   No facility-administered encounter medications on file as of 10/30/2021.     ONCOLOGIC FAMILY HISTORY:  Family History  Problem Relation Age of Onset   Heart attack Mother    Heart failure Father    High blood pressure Brother    Cancer Maternal Grandmother 9       breast cancer   Breast cancer  Maternal Grandmother      GENETIC COUNSELING/TESTING: No  SOCIAL HISTORY:  Ayushi Pla is widowed and lives in [place].  She denies any current or history of tobacco, alcohol, or illicit drug use.     PHYSICAL EXAMINATION:  Vital Signs:   Vitals:   10/30/21 1122  BP: (!) 157/87  Pulse: 66  Resp: 20  Temp: (!) 97.2 F (36.2 C)  SpO2: 100%   Filed Weights   10/30/21 1122  Weight: 141 lb 12.8 oz (64.3 kg)   General: Well-nourished, well-appearing female in no acute distress.   HEENT:   Sclerae anicteric.  Lymph: No cervical, supraclavicular, or infraclavicular lymphadenopathy noted on palpation.  Cardiovascular: Regular rate and rhythm. Respiratory: Clear; breathing non-labored.  GI: Abdomen soft and round; non-tender, non-distended. Bowel sounds normoactive.  Neuro: No focal deficits. Steady gait.  Psych: Mood and affect normal and appropriate for situation.  Extremities: No edema. MSK: No focal spinal tenderness to palpation.  Full range of motion in bilateral upper extremities Skin: Warm and dry. Breast exam: breast are symmetrical without nipple discharge or inversion. S/p right lumpectomy, incisions completely healed. Mild chest wall hyperpigmentation. Point tenderness in the upper inner right breast 1-3 oclock position 5 cmfn, without palpable mass. No palpable mass or adenopathy elsewhere in either breast or axilla that I could appreciate.    LABORATORY DATA:  None for this visit.  DIAGNOSTIC IMAGING:  None for this visit.      ASSESSMENT AND PLAN:  Ms.. Truett is a pleasant 75 y.o. female with Stage IA right breast invasive ductal carcinoma, ER+/PR-/HER2+, diagnosed in 04/2021, treated with lumpectomy, adjuvant radiation therapy, and anti-estrogen therapy with anastrozole beginning in 06/2021.  She presents to the Survivorship Clinic for our initial meeting and routine follow-up post-completion of treatment for breast cancer.    1. Stage IA right  breast cancer:  Ms. Laffey has recovered well from definitive treatment for breast cancer. She will follow-up with her medical oncologist, Dr. Burr Medico in 12/2021 with history and physical exam per surveillance protocol.  She will continue her anti-estrogen therapy with anastrozole. Thus far, she is tolerating it well, with minimal side effects mainly intermittent mild hot flashes. She was instructed to make Dr. Burr Medico or myself aware if she begins to experience any worsening side effects of the medication and I could see her back in clinic to help manage those side effects, as needed. Today, a comprehensive survivorship care plan and treatment summary was reviewed with the patient today detailing her breast cancer diagnosis, treatment course, potential late/long-term effects of treatment, appropriate follow-up care with recommendations for the future, and patient education resources.  A copy of this summary, along  with a letter will be sent to the patients primary care provider via In Basket message after todays visit.    2. Right upper inner breast pain, new- Ms. Bowerman has typical post-op pain in the right breast but recently developed dull ache in the R upper inner quadrant. This type is not typical for surgical pain. There is no palpable mass. I reviewed her prior imaging, she does have dense breast tissue cat C. I reviewed this is likely an area of dense breast tissue, but will proceed with R diag mammo/US to evaluate it further. We will arrange and f/up with results.   3. Bone health:  Given Ms. Rivard's age/history of breast cancer and her current treatment regimen including anti-estrogen therapy with anastrozole, she is at risk for bone demineralization.  Her last DEXA scan was 04/2021, which showed osteopenia. She may benefit from bisphosphonate in the future. She was encouraged to take daily calcium/Vit D and increase weight-bearing activities.  She was given education on specific activities to  promote bone health.  4. Cancer screening:  Due to Ms. Balboni's history and her age, she should receive screening for skin and colon cancers. She had a recent PAP that was negative and likely aged out of further pap screening.  The information and recommendations are listed on the patient's comprehensive care plan/treatment summary and were reviewed in detail with the patient.    5. Health maintenance and wellness promotion: Ms. Sowder was encouraged to consume 5-7 servings of fruits and vegetables per day. We reviewed the "Nutrition Rainbow" handout.  She was also encouraged to engage in moderate exercise for 30 minutes per day most days of the week. She was instructed to limit her alcohol consumption and continue to abstain from tobacco use.     6. Support services/counseling: It is not uncommon for this period of the patient's cancer care trajectory to be one of many emotions and stressors.  We discussed an opportunity for her to participate in the next session of [facility] ("Finding Your New Normal") support group series designed for patients after they have completed treatment.   Ms. Kelner was encouraged to take advantage of our many other support services programs, support groups, and/or counseling in coping with her new life as a cancer survivor after completing anti-cancer treatment.  She was offered support today through active listening and expressive supportive counseling.  She was given information regarding our available services and encouraged to contact me with any questions or for help enrolling in any of our support group/programs.    Dispo:   -R breast diagnostic mammo/US in 1-2 weeks for new right breast pain -Return to cancer center 12/2021 as previously scheduled  -Continue Anastrozole, refilled  -bilateral Mammogram due in 03/2022 -Follow up with surgery as indicated  -She is welcome to return back to the Survivorship Clinic at any time; no additional follow-up needed at this  time.  -Consider referral back to survivorship as a long-term survivor for continued surveillance  Orders Placed This Encounter  Procedures   MM DIAG BREAST TOMO UNI RIGHT    Standing Status:   Future    Standing Expiration Date:   10/30/2022    Scheduling Instructions:     [facility]    Order Specific Question:   Reason for Exam (SYMPTOM  OR DIAGNOSIS REQUIRED)    Answer:   h/o R breast cancer s/p lumpectomy/RT in 04/2021. new R breast pain 1-3 o'clock 5 cmfn    Order Specific Question:   Preferred  imaging location?    Answer:   External   US BREAST LTD UNI RIGHT INC AXILLA    Standing Status:   Future    Standing Expiration Date:   10/30/2022    Scheduling Instructions:     [facility]    Order Specific Question:   Reason for Exam (SYMPTOM  OR DIAGNOSIS REQUIRED)    Answer:   h/o R breast cancer s/p lumpectomy/RT in 04/2021. new R breast pain 1-3 o'clock 5 cmfn    Order Specific Question:   Preferred imaging location?    Answer:   External     A total of (40) minutes of face-to-face time was spent with this patient with greater than 50% of that time in counseling and care-coordination.   Cira Rue, NP Survivorship Program [place] [phone]   Note: PRIMARY CARE PROVIDER Deland Pretty, [place] [phone]

## 2021-11-27 DIAGNOSIS — M546 Pain in thoracic spine: Secondary | ICD-10-CM | POA: Diagnosis not present

## 2021-11-27 DIAGNOSIS — M5451 Vertebrogenic low back pain: Secondary | ICD-10-CM | POA: Diagnosis not present

## 2021-12-08 DIAGNOSIS — M546 Pain in thoracic spine: Secondary | ICD-10-CM | POA: Diagnosis not present

## 2021-12-11 DIAGNOSIS — R922 Inconclusive mammogram: Secondary | ICD-10-CM | POA: Diagnosis not present

## 2021-12-11 DIAGNOSIS — N644 Mastodynia: Secondary | ICD-10-CM | POA: Diagnosis not present

## 2021-12-17 DIAGNOSIS — M5451 Vertebrogenic low back pain: Secondary | ICD-10-CM | POA: Diagnosis not present

## 2021-12-18 DIAGNOSIS — R2232 Localized swelling, mass and lump, left upper limb: Secondary | ICD-10-CM | POA: Diagnosis not present

## 2021-12-18 DIAGNOSIS — E785 Hyperlipidemia, unspecified: Secondary | ICD-10-CM | POA: Diagnosis not present

## 2021-12-18 DIAGNOSIS — E041 Nontoxic single thyroid nodule: Secondary | ICD-10-CM | POA: Diagnosis not present

## 2021-12-18 DIAGNOSIS — M810 Age-related osteoporosis without current pathological fracture: Secondary | ICD-10-CM | POA: Diagnosis not present

## 2021-12-18 DIAGNOSIS — E559 Vitamin D deficiency, unspecified: Secondary | ICD-10-CM | POA: Diagnosis not present

## 2021-12-20 ENCOUNTER — Other Ambulatory Visit: Payer: Self-pay | Admitting: Registered Nurse

## 2021-12-20 DIAGNOSIS — E7800 Pure hypercholesterolemia, unspecified: Secondary | ICD-10-CM | POA: Diagnosis not present

## 2021-12-20 DIAGNOSIS — Z23 Encounter for immunization: Secondary | ICD-10-CM | POA: Diagnosis not present

## 2021-12-20 DIAGNOSIS — D0592 Unspecified type of carcinoma in situ of left breast: Secondary | ICD-10-CM | POA: Diagnosis not present

## 2021-12-20 DIAGNOSIS — E041 Nontoxic single thyroid nodule: Secondary | ICD-10-CM | POA: Diagnosis not present

## 2021-12-20 DIAGNOSIS — E559 Vitamin D deficiency, unspecified: Secondary | ICD-10-CM | POA: Diagnosis not present

## 2021-12-20 DIAGNOSIS — Z8249 Family history of ischemic heart disease and other diseases of the circulatory system: Secondary | ICD-10-CM

## 2021-12-20 DIAGNOSIS — I10 Essential (primary) hypertension: Secondary | ICD-10-CM | POA: Diagnosis not present

## 2021-12-20 DIAGNOSIS — Z0000 Encounter for general adult medical examination without abnormal findings: Secondary | ICD-10-CM | POA: Diagnosis not present

## 2021-12-21 ENCOUNTER — Other Ambulatory Visit: Payer: Self-pay

## 2021-12-27 ENCOUNTER — Other Ambulatory Visit: Payer: Self-pay

## 2021-12-27 DIAGNOSIS — Z170 Estrogen receptor positive status [ER+]: Secondary | ICD-10-CM

## 2021-12-28 ENCOUNTER — Inpatient Hospital Stay: Payer: Medicare Other | Attending: Nurse Practitioner | Admitting: Hematology

## 2021-12-28 ENCOUNTER — Other Ambulatory Visit: Payer: Self-pay

## 2021-12-28 ENCOUNTER — Inpatient Hospital Stay: Payer: Medicare Other

## 2021-12-28 VITALS — BP 129/90 | HR 82 | Temp 98.5°F | Resp 17 | Wt 144.4 lb

## 2021-12-28 DIAGNOSIS — Z923 Personal history of irradiation: Secondary | ICD-10-CM | POA: Diagnosis not present

## 2021-12-28 DIAGNOSIS — Z79899 Other long term (current) drug therapy: Secondary | ICD-10-CM | POA: Diagnosis not present

## 2021-12-28 DIAGNOSIS — Z79811 Long term (current) use of aromatase inhibitors: Secondary | ICD-10-CM | POA: Diagnosis not present

## 2021-12-28 DIAGNOSIS — G8929 Other chronic pain: Secondary | ICD-10-CM | POA: Insufficient documentation

## 2021-12-28 DIAGNOSIS — M25511 Pain in right shoulder: Secondary | ICD-10-CM | POA: Diagnosis not present

## 2021-12-28 DIAGNOSIS — C50511 Malignant neoplasm of lower-outer quadrant of right female breast: Secondary | ICD-10-CM | POA: Diagnosis not present

## 2021-12-28 DIAGNOSIS — Z170 Estrogen receptor positive status [ER+]: Secondary | ICD-10-CM | POA: Insufficient documentation

## 2021-12-28 DIAGNOSIS — M8580 Other specified disorders of bone density and structure, unspecified site: Secondary | ICD-10-CM | POA: Insufficient documentation

## 2021-12-28 LAB — CMP (CANCER CENTER ONLY)
ALT: 14 U/L (ref 0–44)
AST: 20 U/L (ref 15–41)
Albumin: 4.4 g/dL (ref 3.5–5.0)
Alkaline Phosphatase: 77 U/L (ref 38–126)
Anion gap: 5 (ref 5–15)
BUN: 16 mg/dL (ref 8–23)
CO2: 32 mmol/L (ref 22–32)
Calcium: 10 mg/dL (ref 8.9–10.3)
Chloride: 104 mmol/L (ref 98–111)
Creatinine: 0.67 mg/dL (ref 0.44–1.00)
GFR, Estimated: >60 mL/min (ref >60)
Glucose, Bld: 103 mg/dL — ABNORMAL HIGH (ref 70–99)
Potassium: 4.3 mmol/L (ref 3.5–5.1)
Sodium: 141 mmol/L (ref 135–145)
Total Bilirubin: 1 mg/dL (ref 0.3–1.2)
Total Protein: 6.9 g/dL (ref 6.5–8.1)

## 2021-12-28 LAB — CBC WITH DIFFERENTIAL (CANCER CENTER ONLY)
Abs Immature Granulocytes: 0.01 10*3/uL (ref 0.00–0.07)
Basophils Absolute: 0 10*3/uL (ref 0.0–0.1)
Basophils Relative: 1 %
Eosinophils Absolute: 0 10*3/uL (ref 0.0–0.5)
Eosinophils Relative: 1 %
HCT: 39.6 % (ref 36.0–46.0)
Hemoglobin: 13.6 g/dL (ref 12.0–15.0)
Immature Granulocytes: 0 %
Lymphocytes Relative: 27 %
Lymphs Abs: 0.8 10*3/uL (ref 0.7–4.0)
MCH: 30.6 pg (ref 26.0–34.0)
MCHC: 34.3 g/dL (ref 30.0–36.0)
MCV: 89.2 fL (ref 80.0–100.0)
Monocytes Absolute: 0.3 10*3/uL (ref 0.1–1.0)
Monocytes Relative: 10 %
Neutro Abs: 1.9 10*3/uL (ref 1.7–7.7)
Neutrophils Relative %: 61 %
Platelet Count: 163 10*3/uL (ref 150–400)
RBC: 4.44 MIL/uL (ref 3.87–5.11)
RDW: 12.4 % (ref 11.5–15.5)
WBC Count: 3.1 10*3/uL — ABNORMAL LOW (ref 4.0–10.5)
nRBC: 0 % (ref 0.0–0.2)

## 2021-12-28 NOTE — Progress Notes (Signed)
Tracy Horne   [phone] [phone]   Clinic Follow up Note   Patient Care Team: Deland Pretty, MD as PCP - General (Internal Medicine) Valinda Party, MD (Rheumatology) Rockwell Germany, RN as Oncology Nurse Navigator Mauro Kaufmann, RN as Oncology Nurse Navigator Rolm Bookbinder, MD as Consulting Physician (General Surgery) Alla Feeling, NP as Nurse Practitioner (Nurse Practitioner) Truitt Merle, MD as Consulting Physician (Hematology)  Date of Service:  12/28/2021  CHIEF COMPLAINT: f/u of right breast cancer  CURRENT THERAPY:  Anastrozole, started 07/2021  ASSESSMENT & PLAN:  Tracy Horne is a 76 y.o. female with   1. 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 There was progression on 03/13/21 mammogram. 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  -She underwent right lumpectomy on 04/16/21 with Dr Donne Hazel. Surgical pathology showed a 0.15cm grade I invasive ductal carcinoma and components of high grade DCIS, ER/HER2 positive and ER negative.  -She completed radiation therapy 05/28/21 - 06/28/21, tolerated well overall  -she started anastrozole in 07/2021, plan for 5 years. She is tolerating well overall with some joint pain, which she had at baseline. She Horne let us know if this becomes intolerable. -she reported new right UIQ breast pain at last visit in 10/2021. She reports she underwent work up with mammogram and [place] at [place], which were negative. She Horne still be due for bilateral mammogram in 03/2022. -she is clinically doing well from a breast cancer standpoint. Labs reviewed, no concern. Physical exam was unremarkable. -continue anastrozole, f/u in 6 months.   2. Bone Health  -DEXA on 05/08/21 showed osteopenia (-2.1 in L1-4) -she reports she has tried alendronate but experienced increasing bone  pain. She endorses taking vit D. -We again reviewed possibly adding Zometa to increase her bone density. She declines any kind of bone medication today. I discussed if this worsens, I Horne switch her to tamoxifen.  3. Chronic right shoulder pain -underwent workup with MRI with orthopedics, completed PT in 09/2021.     PLAN:  -continue anastrozole -lab and f/u in 6 months   No problem-specific Assessment & Plan notes found for this encounter.   SUMMARY OF ONCOLOGIC HISTORY: Oncology History Overview Note  Cancer Staging Malignant neoplasm of lower-outer quadrant of right breast of female, estrogen receptor positive ([place]) Staging form: Breast, AJCC 8th Edition - Pathologic stage from 04/16/2021: Stage Unknown (pT1a, pNX, G1, ER+, PR-, HER2+) - Signed by Truitt Merle, MD on 05/06/2021 Stage prefix: Initial diagnosis Multigene prognostic tests performed: None Histologic grading system: 3 grade system Residual tumor (R): R0 - None    Malignant neoplasm of lower-outer quadrant of right breast of female, estrogen receptor positive ([place])  08/16/2020 Pathology Results   Diagnosis Breast, right, needle core biopsy - LOBULAR NEOPLASIA (ATYPICAL LOBULAR HYPERPLASIA) WITH MICROCALCIFICATIONS - SEE COMMENT Microscopic Comment These results were called to [facility] on August 17, 2020.   03/13/2021 Mammogram   Right mammogram 03/13/21 There are pleomorphic calcifications spanning 1.6cm in the slightly linear configuration within the right lower outer breast anterior to middle depth 2.7cm from the nipple. These are increased in number. No other significant masses or calcifications.    Left Mammogram on 03/20/21  Negative    03/20/2021 Initial Biopsy   Diagnosis Breast, right, needle core biopsy, lower outer, 2.7cmfn, anterior depth - MAMMARY CARCINOMA IN  SITU WITH CALCIFICATIONS AND NECROSIS. - LOBULAR NEOPLASIA (ATYPICAL LOBULAR HYPERPLASIA). Microscopic Comment The carcinoma appears high  grade. E-cadherin is pending and Horne be reported in an addendum. Dr. Saralyn Pilar has reviewed the case. The case was called to Dr. Luan Pulling on 03/21/2021.  E-cadherin is positive in the areas of in situ carcinoma and thus, these areas are consistent with high grade ductal carcinoma in situ. E-cadherin is negative in areas of atypical lobular hyperplasia.   04/16/2021 Cancer Staging   Staging form: Breast, AJCC 8th Edition - Pathologic stage from 04/16/2021: Stage Unknown (pT1a, pNX, G1, ER+, PR-, HER2+) - Signed by Truitt Merle, MD on 05/06/2021 Stage prefix: Initial diagnosis Multigene prognostic tests performed: None Histologic grading system: 3 grade system Residual tumor (R): R0 - None    04/16/2021 Surgery   RIGHT BREAST LUMPECTOMY WITH RADIOACTIVE SEED LOCALIZATION by Dr Donne Hazel    FINAL MICROSCOPIC DIAGNOSIS:   A. 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   All controls stained appropriately.    05/06/2021 Initial Diagnosis   Malignant neoplasm of lower-outer quadrant of right breast of female, estrogen receptor positive ([place])   05/24/2021 - 06/28/2021 Radiation Therapy   Whole breast radiation by Dr. Noreene Filbert    07/2021 -  Anti-estrogen oral therapy   Adjuvant Anastrozole   10/30/2021 Survivorship   SCP delivered by Cira Rue,  NP      INTERVAL HISTORY:  Tracy Horne is here for a follow up of breast cancer. She was last seen by me on 06/29/21 with survivorship visit in the interim. She presents to the clinic alone. She reports she is doing well overall. She notes joint pain, which is a chronic issue for her. She reports her prior breast pain has been evaluated at [facility], no concerning findings.   All other systems were reviewed with the patient and are negative.  MEDICAL HISTORY:  Past Medical History:  Diagnosis Date   Arthritis    shoulders and lower back per patient   Headache    Heart murmur    Hypertension    Thyroid disease     SURGICAL HISTORY: Past Surgical History:  Procedure Laterality Date   BREAST EXCISIONAL BIOPSY Left    BREAST LUMPECTOMY WITH RADIOACTIVE SEED LOCALIZATION Right 04/16/2021   Procedure: RIGHT BREAST LUMPECTOMY WITH RADIOACTIVE SEED LOCALIZATION;  Surgeon: Rolm Bookbinder, MD;  Location: [facility];  Service: General;  Laterality: Right;   CARPAL TUNNEL RELEASE Right    PILONIDAL CYST EXCISION     THYROID CYST EXCISION Right    TONSILLECTOMY     removed as a child    I have reviewed the social history and family history with the patient and they are unchanged from previous note.  ALLERGIES:  is allergic to alendronate sodium, felodipine, and vitamin d.  MEDICATIONS:  Current Outpatient Medications  Medication Sig Dispense Refill   acetaminophen (TYLENOL) 325 MG tablet Take 650 mg by mouth every 6 (six) hours as needed for moderate pain.     amLODipine (NORVASC) 2.5 MG tablet Take 2.5 mg by mouth daily.     anastrozole (ARIMIDEX) 1 MG tablet TAKE 1 TABLET(1 MG) BY MOUTH DAILY 90 tablet 2   aspirin EC 81 MG tablet Take 81 mg by mouth every other day. Swallow whole.     calcium carbonate (TUMS - DOSED IN MG ELEMENTAL CALCIUM) 500 MG chewable tablet Chew 1 tablet by mouth daily as needed for indigestion or heartburn.     cholecalciferol (VITAMIN D3) 25 MCG (1000 UNIT)  tablet Take 1,000 Units by mouth daily.     meloxicam (MOBIC) 15 MG tablet Take 15 mg by mouth daily.     Multiple Vitamins-Minerals (HAIR FORMULA EXTRA STRENGTH PO) Take 1 tablet by mouth daily.     Omega-3 1000 MG CAPS Take 1,000 mg by mouth daily.     OVER THE COUNTER MEDICATION Apply 1 application topically at bedtime as needed (pain). Magnesium Cream     Propylene Glycol (SYSTANE BALANCE) 0.6 % SOLN Place 1 drop into both eyes daily as needed (dry eyes).     telmisartan-hydrochlorothiazide (MICARDIS HCT) 80-12.5 MG tablet Take 1 tablet by mouth daily.     valACYclovir (VALTREX) 500 MG tablet TAKE 2 TABLETS(1000 MG) BY MOUTH TWICE DAILY (Patient taking differently: Take 500 mg by mouth 2 (two) times daily as needed (outbreaks).) 20 tablet 0   No current facility-administered medications for this visit.    PHYSICAL EXAMINATION: ECOG PERFORMANCE STATUS: 1 - Symptomatic but completely ambulatory  Vitals:   12/28/21 1216  BP: 129/90  Pulse: 82  Resp: 17  Temp: 98.5 F (36.9 C)  SpO2: 98%   Wt Readings from Last 3 Encounters:  12/28/21 144 lb 6 oz (65.5 kg)  10/30/21 141 lb 12.8 oz (64.3 kg)  08/16/21 140 lb 8 oz (63.7 kg)     GENERAL:alert, no distress and comfortable SKIN: skin color, texture, turgor are normal, no rashes or significant lesions EYES: normal, Conjunctiva are pink and non-injected, sclera clear  NECK: supple, thyroid normal size, non-tender, without nodularity LYMPH:  no palpable lymphadenopathy in the cervical, axillary  LUNGS: clear to auscultation and percussion with normal breathing effort HEART: regular rate & rhythm and no murmurs and no lower extremity edema ABDOMEN:abdomen soft, non-tender and normal bowel sounds Musculoskeletal:no cyanosis of digits and no clubbing  NEURO: alert & oriented x 3 with fluent speech, no focal motor/sensory deficits BREAST: No palpable mass, nodules or adenopathy bilaterally. Breast exam benign.   LABORATORY DATA:  I  have reviewed the data as listed CBC Latest Ref Rng & Units 12/28/2021 06/21/2021 06/07/2021  WBC 4.0 - 10.5 K/uL 3.1(L) 5.7 3.2(L)  Hemoglobin 12.0 - 15.0 g/dL 13.6 14.2 14.2  Hematocrit 36.0 - 46.0 % 39.6 41.8 42.2  Platelets 150 - 400 K/uL 163 213 192     CMP Latest Ref Rng & Units 04/10/2021 10/29/2010  Glucose 70 - 99 mg/dL 98 94  BUN 8 - 23 mg/dL 14 11  Creatinine 0.44 - 1.00 mg/dL 0.64 0.69  Sodium 135 - 145 mmol/L 138 145  Potassium 3.5 - 5.1 mmol/L 3.7 4.5  Chloride 98 - 111 mmol/L 102 106  CO2 22 - 32 mmol/L 28 30  Calcium 8.9 - 10.3 mg/dL  9.6 9.8      RADIOGRAPHIC STUDIES: I have personally reviewed the radiological images as listed and agreed with the findings in the report. No results found.    Orders Placed This Encounter  Procedures   MM DIAG BREAST TOMO BILATERAL    Standing Status:   Future    Standing Expiration Date:   12/28/2022    Scheduling Instructions:     [facility]    Order Specific Question:   Reason for Exam (SYMPTOM  OR DIAGNOSIS REQUIRED)    Answer:   screening    Order Specific Question:   Preferred imaging location?    Answer:   External   All questions were answered. The patient knows to call the clinic with any problems, questions or concerns. No barriers to learning was detected. The total time spent in the appointment was 30 minutes.     Aurea Graff 12/28/2021   I, Wilburn Mylar, am acting as scribe for Truitt Merle, MD.   I have reviewed the above documentation for accuracy and completeness, and I agree with the above.    2

## 2021-12-29 ENCOUNTER — Encounter: Payer: Self-pay | Admitting: Hematology

## 2022-01-01 ENCOUNTER — Telehealth: Payer: Self-pay | Admitting: Hematology

## 2022-01-01 NOTE — Telephone Encounter (Signed)
Scheduled follow-up appointment per 2/17 los. Patient is aware. 

## 2022-01-02 DIAGNOSIS — M546 Pain in thoracic spine: Secondary | ICD-10-CM | POA: Diagnosis not present

## 2022-01-02 DIAGNOSIS — M5459 Other low back pain: Secondary | ICD-10-CM | POA: Diagnosis not present

## 2022-01-02 DIAGNOSIS — M799 Soft tissue disorder, unspecified: Secondary | ICD-10-CM | POA: Diagnosis not present

## 2022-01-04 ENCOUNTER — Other Ambulatory Visit: Payer: Self-pay | Admitting: Orthopedic Surgery

## 2022-01-04 DIAGNOSIS — R2232 Localized swelling, mass and lump, left upper limb: Secondary | ICD-10-CM | POA: Diagnosis not present

## 2022-01-04 DIAGNOSIS — D2112 Benign neoplasm of connective and other soft tissue of left upper limb, including shoulder: Secondary | ICD-10-CM | POA: Diagnosis not present

## 2022-01-08 DIAGNOSIS — M546 Pain in thoracic spine: Secondary | ICD-10-CM | POA: Diagnosis not present

## 2022-01-08 DIAGNOSIS — M5459 Other low back pain: Secondary | ICD-10-CM | POA: Diagnosis not present

## 2022-01-08 DIAGNOSIS — M799 Soft tissue disorder, unspecified: Secondary | ICD-10-CM | POA: Diagnosis not present

## 2022-01-10 DIAGNOSIS — M546 Pain in thoracic spine: Secondary | ICD-10-CM | POA: Diagnosis not present

## 2022-01-10 DIAGNOSIS — M799 Soft tissue disorder, unspecified: Secondary | ICD-10-CM | POA: Diagnosis not present

## 2022-01-10 DIAGNOSIS — M5459 Other low back pain: Secondary | ICD-10-CM | POA: Diagnosis not present

## 2022-01-11 ENCOUNTER — Other Ambulatory Visit: Payer: Medicare Other

## 2022-01-11 ENCOUNTER — Ambulatory Visit
Admission: RE | Admit: 2022-01-11 | Discharge: 2022-01-11 | Disposition: A | Discharge status: Home / Self Care | Payer: No Typology Code available for payment source | Source: Ambulatory Visit | Attending: Registered Nurse | Admitting: Registered Nurse

## 2022-01-11 DIAGNOSIS — Z8249 Family history of ischemic heart disease and other diseases of the circulatory system: Secondary | ICD-10-CM

## 2022-01-15 DIAGNOSIS — M546 Pain in thoracic spine: Secondary | ICD-10-CM | POA: Diagnosis not present

## 2022-01-15 DIAGNOSIS — M5459 Other low back pain: Secondary | ICD-10-CM | POA: Diagnosis not present

## 2022-01-15 DIAGNOSIS — M799 Soft tissue disorder, unspecified: Secondary | ICD-10-CM | POA: Diagnosis not present

## 2022-01-16 DIAGNOSIS — Z23 Encounter for immunization: Secondary | ICD-10-CM | POA: Diagnosis not present

## 2022-01-17 ENCOUNTER — Encounter: Payer: Self-pay | Admitting: Radiation Oncology

## 2022-01-17 ENCOUNTER — Ambulatory Visit
Admission: RE | Admit: 2022-01-17 | Discharge: 2022-01-17 | Disposition: A | Discharge status: Home / Self Care | Payer: Medicare Other | Source: Ambulatory Visit | Attending: Radiation Oncology | Admitting: Radiation Oncology

## 2022-01-17 ENCOUNTER — Other Ambulatory Visit: Payer: Self-pay

## 2022-01-17 VITALS — BP 121/88 | HR 73 | Temp 97.1°F | Resp 16 | Wt 144.0 lb

## 2022-01-17 DIAGNOSIS — Z79811 Long term (current) use of aromatase inhibitors: Secondary | ICD-10-CM | POA: Diagnosis not present

## 2022-01-17 DIAGNOSIS — Z170 Estrogen receptor positive status [ER+]: Secondary | ICD-10-CM | POA: Insufficient documentation

## 2022-01-17 DIAGNOSIS — C50511 Malignant neoplasm of lower-outer quadrant of right female breast: Secondary | ICD-10-CM | POA: Diagnosis not present

## 2022-01-17 DIAGNOSIS — Z923 Personal history of irradiation: Secondary | ICD-10-CM | POA: Diagnosis not present

## 2022-01-17 DIAGNOSIS — Z08 Encounter for follow-up examination after completed treatment for malignant neoplasm: Secondary | ICD-10-CM | POA: Diagnosis not present

## 2022-01-17 NOTE — Progress Notes (Signed)
Radiation Oncology ?Follow up Note ? ?Name: Tracy Horne   ?Date:   01/17/2022 ?MRN:  [id number] ?DOB: 11/30/1945  ? ? ?This 76 y.o. female presents to the clinic today for 66-monthfollow-up status post whole breast radiation to her right breast for stage Ia (T1 a N0 M0) ER/PR positive invasive mammary carcinoma. ? ?REFERRING PROVIDER: PDeland Pretty MD ? ?HPI: Patient is a 76year old female now out 6 months having completed whole breast radiation to her right breast for stage Ia ER positive invasive mammary carcinoma.  Seen today in routine follow-up she is doing well.  She specifically denies breast tenderness cough or bone pain.  [facility] no [facility] of [facility].  She she is currently on Arimidex tolerating it well without side effects. ?COMPLICATIONS OF TREATMENT: none ? ?FOLLOW UP COMPLIANCE: keeps appointments  ? ?PHYSICAL EXAM:  ?BP 121/88 (BP Location: Left Arm, Patient Position: Sitting)   Pulse 73   Temp (!) 97.1 ?F (36.2 ?C) (Tympanic)   Resp 16   Wt 144 lb (65.3 kg)   BMI 27.21 kg/m?  ?Lungs are clear to A&P cardiac examination essentially unremarkable with regular rate and rhythm. No dominant mass or nodularity is noted in either breast in 2 positions examined. Incision is well-healed. No axillary or supraclavicular adenopathy is appreciated. Cosmetic result is excellent.  Well-developed well-nourished patient in NAD. HEENT reveals PERLA, EOMI, discs not visualized.  Oral cavity is clear. No oral mucosal lesions are identified. Neck is clear without evidence of cervical or supraclavicular adenopathy. Lungs are clear to A&P. Cardiac examination is essentially unremarkable with regular rate and rhythm without murmur rub or thrill. Abdomen is benign with no organomegaly or masses noted. Motor sensory and DTR levels are equal and symmetric in the upper and lower extremities. Cranial nerves II through XII are grossly intact. Proprioception is intact. No peripheral  adenopathy or edema is identified. No motor or sensory levels are noted. Crude visual fields are within normal range. ? ?RADIOLOGY RESULTS: Mammograms have been requested for my review ? ?PLAN: Present time patient is now 6 months out with no evidence of disease.  She continues to do well.  I am pleased with her overall progress.  I have asked to see her back in 6 months for follow-up and then will start once a year follow-up appointments.  She continues close follow-up care by medical oncology.  Patient knows to call with any concerns. ? ?I would like to take this opportunity to thank you for allowing me to participate in the care of your patient.. ?  ? GNoreene Filbert MD ? ?

## 2022-01-18 DIAGNOSIS — M546 Pain in thoracic spine: Secondary | ICD-10-CM | POA: Diagnosis not present

## 2022-01-18 DIAGNOSIS — M5459 Other low back pain: Secondary | ICD-10-CM | POA: Diagnosis not present

## 2022-01-18 DIAGNOSIS — M799 Soft tissue disorder, unspecified: Secondary | ICD-10-CM | POA: Diagnosis not present

## 2022-01-22 DIAGNOSIS — M5459 Other low back pain: Secondary | ICD-10-CM | POA: Diagnosis not present

## 2022-01-22 DIAGNOSIS — M546 Pain in thoracic spine: Secondary | ICD-10-CM | POA: Diagnosis not present

## 2022-01-22 DIAGNOSIS — M799 Soft tissue disorder, unspecified: Secondary | ICD-10-CM | POA: Diagnosis not present

## 2022-01-24 DIAGNOSIS — M5459 Other low back pain: Secondary | ICD-10-CM | POA: Diagnosis not present

## 2022-01-24 DIAGNOSIS — M546 Pain in thoracic spine: Secondary | ICD-10-CM | POA: Diagnosis not present

## 2022-01-24 DIAGNOSIS — M799 Soft tissue disorder, unspecified: Secondary | ICD-10-CM | POA: Diagnosis not present

## 2022-01-31 DIAGNOSIS — M5459 Other low back pain: Secondary | ICD-10-CM | POA: Diagnosis not present

## 2022-01-31 DIAGNOSIS — M546 Pain in thoracic spine: Secondary | ICD-10-CM | POA: Diagnosis not present

## 2022-01-31 DIAGNOSIS — M799 Soft tissue disorder, unspecified: Secondary | ICD-10-CM | POA: Diagnosis not present

## 2022-02-01 DIAGNOSIS — M5459 Other low back pain: Secondary | ICD-10-CM | POA: Diagnosis not present

## 2022-02-01 DIAGNOSIS — M546 Pain in thoracic spine: Secondary | ICD-10-CM | POA: Diagnosis not present

## 2022-02-01 DIAGNOSIS — M799 Soft tissue disorder, unspecified: Secondary | ICD-10-CM | POA: Diagnosis not present

## 2022-02-05 DIAGNOSIS — M799 Soft tissue disorder, unspecified: Secondary | ICD-10-CM | POA: Diagnosis not present

## 2022-02-05 DIAGNOSIS — M546 Pain in thoracic spine: Secondary | ICD-10-CM | POA: Diagnosis not present

## 2022-02-05 DIAGNOSIS — M5459 Other low back pain: Secondary | ICD-10-CM | POA: Diagnosis not present

## 2022-02-07 DIAGNOSIS — M799 Soft tissue disorder, unspecified: Secondary | ICD-10-CM | POA: Diagnosis not present

## 2022-02-07 DIAGNOSIS — M546 Pain in thoracic spine: Secondary | ICD-10-CM | POA: Diagnosis not present

## 2022-02-07 DIAGNOSIS — M5459 Other low back pain: Secondary | ICD-10-CM | POA: Diagnosis not present

## 2022-02-12 DIAGNOSIS — M799 Soft tissue disorder, unspecified: Secondary | ICD-10-CM | POA: Diagnosis not present

## 2022-02-12 DIAGNOSIS — M546 Pain in thoracic spine: Secondary | ICD-10-CM | POA: Diagnosis not present

## 2022-02-12 DIAGNOSIS — M5459 Other low back pain: Secondary | ICD-10-CM | POA: Diagnosis not present

## 2022-02-14 DIAGNOSIS — M5459 Other low back pain: Secondary | ICD-10-CM | POA: Diagnosis not present

## 2022-02-14 DIAGNOSIS — M546 Pain in thoracic spine: Secondary | ICD-10-CM | POA: Diagnosis not present

## 2022-02-14 DIAGNOSIS — M799 Soft tissue disorder, unspecified: Secondary | ICD-10-CM | POA: Diagnosis not present

## 2022-02-19 ENCOUNTER — Encounter (HOSPITAL_COMMUNITY): Payer: Self-pay

## 2022-02-19 DIAGNOSIS — M799 Soft tissue disorder, unspecified: Secondary | ICD-10-CM | POA: Diagnosis not present

## 2022-02-19 DIAGNOSIS — M5459 Other low back pain: Secondary | ICD-10-CM | POA: Diagnosis not present

## 2022-02-19 DIAGNOSIS — M546 Pain in thoracic spine: Secondary | ICD-10-CM | POA: Diagnosis not present

## 2022-02-26 DIAGNOSIS — M546 Pain in thoracic spine: Secondary | ICD-10-CM | POA: Diagnosis not present

## 2022-02-26 DIAGNOSIS — M5459 Other low back pain: Secondary | ICD-10-CM | POA: Diagnosis not present

## 2022-02-26 DIAGNOSIS — M799 Soft tissue disorder, unspecified: Secondary | ICD-10-CM | POA: Diagnosis not present

## 2022-04-11 DIAGNOSIS — N644 Mastodynia: Secondary | ICD-10-CM | POA: Diagnosis not present

## 2022-04-11 DIAGNOSIS — R922 Inconclusive mammogram: Secondary | ICD-10-CM | POA: Diagnosis not present

## 2022-05-18 DIAGNOSIS — Z20822 Contact with and (suspected) exposure to covid-19: Secondary | ICD-10-CM | POA: Diagnosis not present

## 2022-05-18 DIAGNOSIS — U071 COVID-19: Secondary | ICD-10-CM | POA: Diagnosis not present

## 2022-05-18 DIAGNOSIS — R6883 Chills (without fever): Secondary | ICD-10-CM | POA: Diagnosis not present

## 2022-05-27 DIAGNOSIS — I10 Essential (primary) hypertension: Secondary | ICD-10-CM | POA: Diagnosis not present

## 2022-05-27 DIAGNOSIS — U071 COVID-19: Secondary | ICD-10-CM | POA: Diagnosis not present

## 2022-05-27 DIAGNOSIS — R000 Tachycardia, unspecified: Secondary | ICD-10-CM | POA: Diagnosis not present

## 2022-06-03 DIAGNOSIS — U071 COVID-19: Secondary | ICD-10-CM | POA: Diagnosis not present

## 2022-06-03 DIAGNOSIS — R000 Tachycardia, unspecified: Secondary | ICD-10-CM | POA: Diagnosis not present

## 2022-06-03 DIAGNOSIS — I10 Essential (primary) hypertension: Secondary | ICD-10-CM | POA: Diagnosis not present

## 2022-06-04 DIAGNOSIS — H524 Presbyopia: Secondary | ICD-10-CM | POA: Diagnosis not present

## 2022-06-04 DIAGNOSIS — H2513 Age-related nuclear cataract, bilateral: Secondary | ICD-10-CM | POA: Diagnosis not present

## 2022-06-04 DIAGNOSIS — H5203 Hypermetropia, bilateral: Secondary | ICD-10-CM | POA: Diagnosis not present

## 2022-06-04 DIAGNOSIS — H52223 Regular astigmatism, bilateral: Secondary | ICD-10-CM | POA: Diagnosis not present

## 2022-06-07 IMAGING — CT CT CARDIAC CORONARY ARTERY CALCIUM SCORE
2 series · 14 of 20 positions shown, 16 images · non-contrast
Comparison: None.

CLINICAL DATA: 76-year-old African American female with history of
hyperlipidemia and family history of heart disease.

EXAM:
CT CARDIAC CORONARY ARTERY CALCIUM SCORE
TECHNIQUE: Non-contrast imaging through the heart was performed using
prospective ECG gating. Image post processing was performed on an
independent workstation, allowing for quantitative analysis of the
heart and coronary arteries. Note that this exam targets the heart
and the chest was not imaged in its entirety.

[Series 2: cascseq 2.0 d10f 70% · axial · 0.34mm/px · z∈[-138,+2]mm · 8 of 86 slices shown]
[im 8/86  vessel]
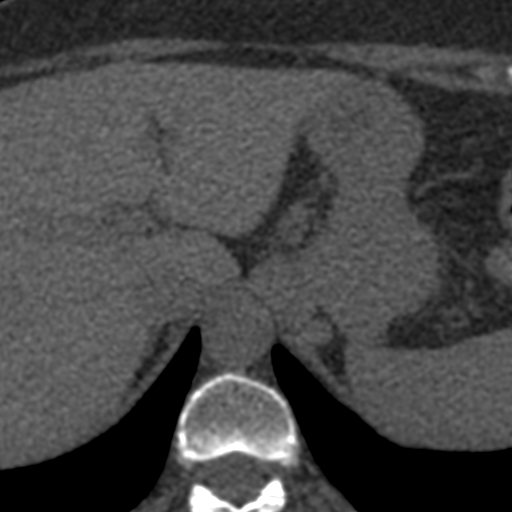
[im 16/86  vessel]
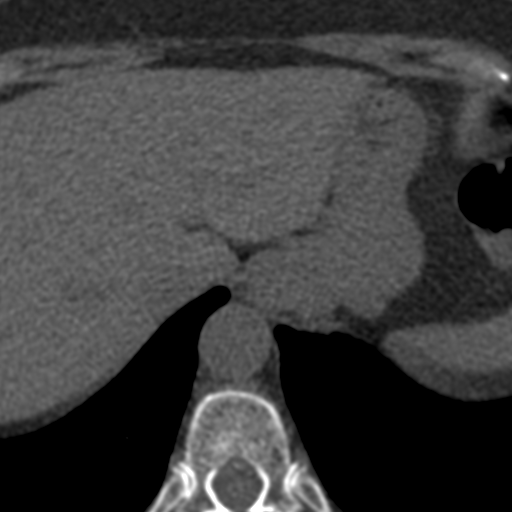
[im 31/86  vessel]
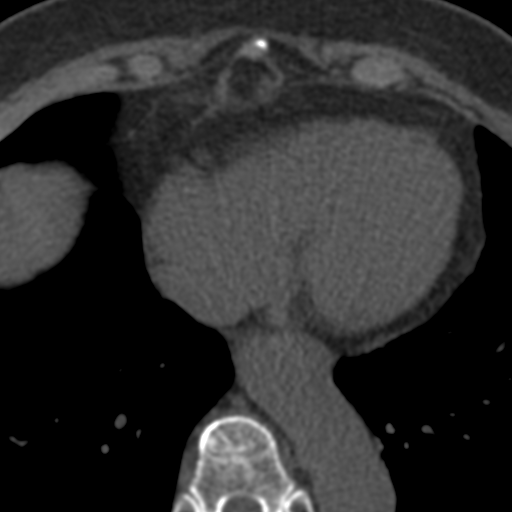
[im 39/86  vessel]
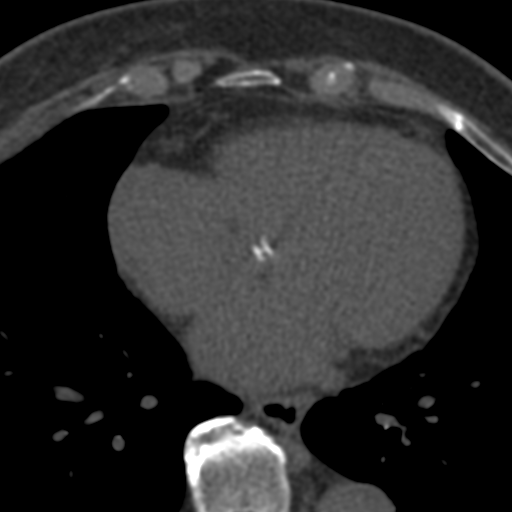
[im 47/86  vessel]
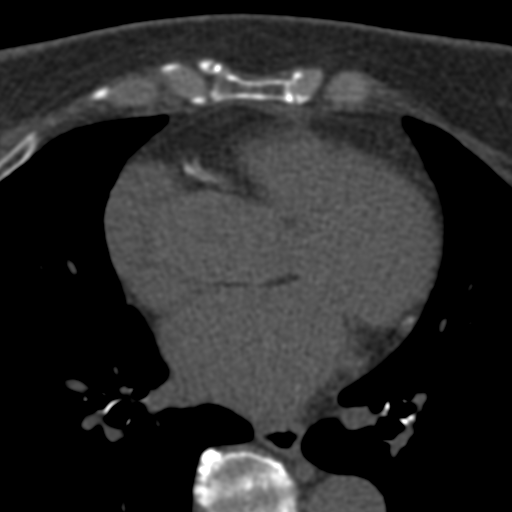
[im 55/86  vessel]
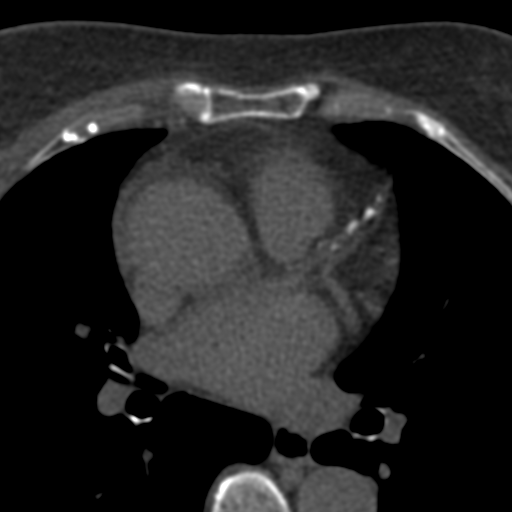
[im 70/86  vessel]
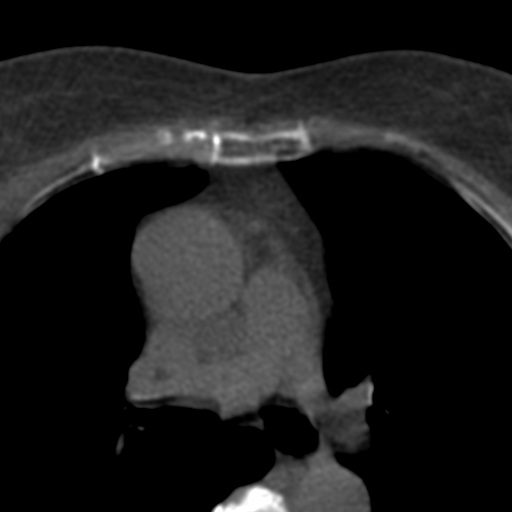
[im 78/86  vessel]
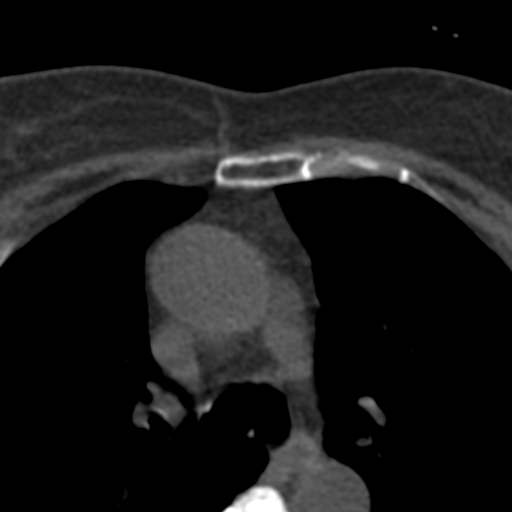

[Series 5: full fov chest · axial · 0.98mm/px · z∈[-127,-7]mm · 6 of 57 slices shown, 8 images]
[im 9/57  vessel]
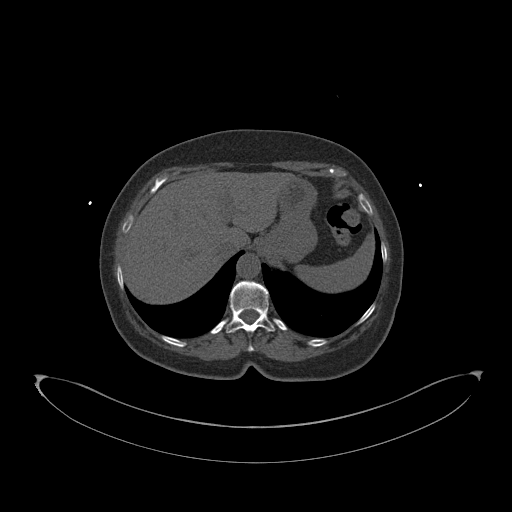
[im 9/57  lung]
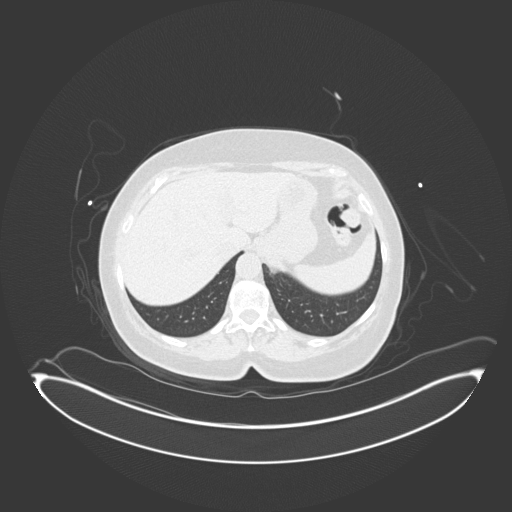
[im 17/57  vessel]
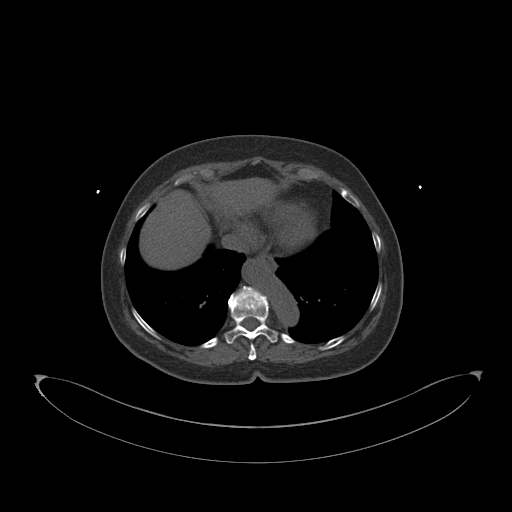
[im 25/57  vessel]
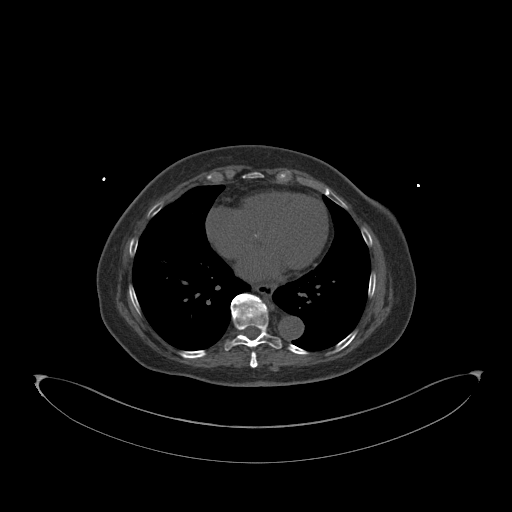
[im 33/57  vessel]
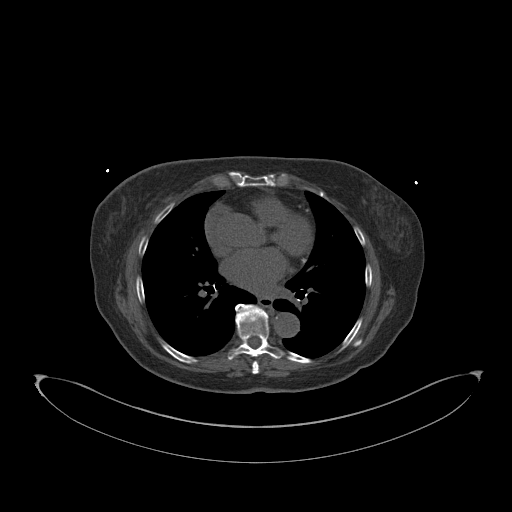
[im 41/57  vessel]
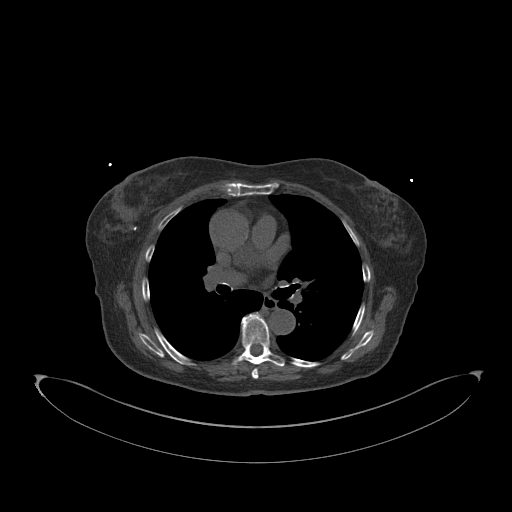
[im 41/57  lung]
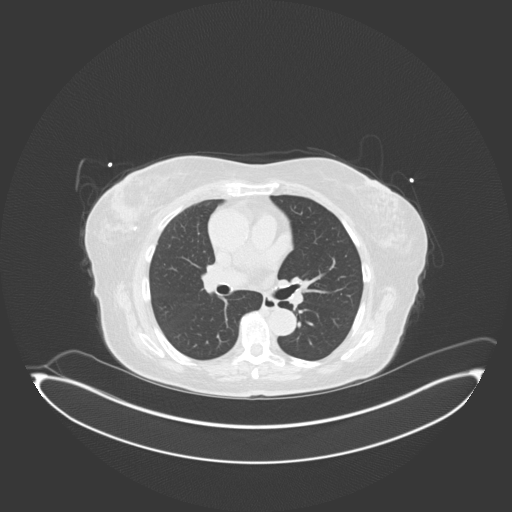
[im 49/57  vessel]
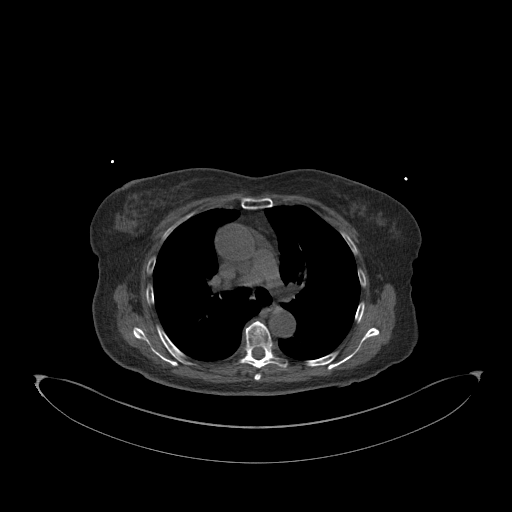

[14 of 20 positions shown; findings below may reference images not displayed]

FINDINGS: CORONARY CALCIUM SCORES:

Left Main: 0

LAD: 134

LCx: 55

RCA: 149

Total Agatston Score: 338

[HOSPITAL] percentile: 85

AORTA MEASUREMENTS:

Ascending Aorta: 35 mm

Descending Aorta: 25 mm

OTHER FINDINGS:

The heart size is within normal limits. There are some
calcifications associated with the aortic valve. No pericardial
fluid is identified. Visualized segments of the thoracic aorta and
central pulmonary arteries are normal in caliber. Visualized
mediastinum and hilar regions demonstrate no lymphadenopathy or
masses. Visualized lungs show no evidence of pulmonary edema,
consolidation, pneumothorax, nodule or pleural fluid. Visualized
upper abdomen and bony structures are unremarkable. Evidence of
prior right breast surgery.
IMPRESSION: 1. Coronary calcium score of 338 is at the 85th percentile for the
patient's age, sex and race.
2. Calcifications associated with the aortic valve. Correlation with
echocardiography may be helpful.

## 2022-06-11 ENCOUNTER — Ambulatory Visit: Payer: Medicare Other | Admitting: Cardiology

## 2022-06-11 ENCOUNTER — Encounter: Payer: Self-pay | Admitting: Cardiology

## 2022-06-11 VITALS — BP 134/84 | HR 68 | Temp 97.6°F | Resp 16 | Ht 61.0 in | Wt 145.2 lb

## 2022-06-11 DIAGNOSIS — E7800 Pure hypercholesterolemia, unspecified: Secondary | ICD-10-CM

## 2022-06-11 DIAGNOSIS — I10 Essential (primary) hypertension: Secondary | ICD-10-CM

## 2022-06-11 DIAGNOSIS — R931 Abnormal findings on diagnostic imaging of heart and coronary circulation: Secondary | ICD-10-CM

## 2022-06-11 DIAGNOSIS — I351 Nonrheumatic aortic (valve) insufficiency: Secondary | ICD-10-CM

## 2022-06-11 DIAGNOSIS — R000 Tachycardia, unspecified: Secondary | ICD-10-CM

## 2022-06-11 MED ORDER — METOPROLOL SUCCINATE ER 25 MG PO TB24: 25.0000 mg | ORAL_TABLET | Freq: Every evening | ORAL | 2 refills | Status: DC

## 2022-06-11 MED ORDER — ATORVASTATIN CALCIUM 20 MG PO TABS: 20.0000 mg | ORAL_TABLET | Freq: Every day | ORAL | 2 refills | Status: DC

## 2022-06-11 NOTE — Progress Notes (Signed)
Primary Physician/Referring:  Deland Pretty, MD  Patient ID: Tracy Horne, female    DOB: 1946-06-07, 76 y.o.   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  Symptoms started about a month ago after she had a trip to [place], at that time she also developed marked leg edema and amlodipine was discontinued with resolution of the edema.  However she has noticed frequent episodes of sudden onset rapid heartbeat lasting for about 30 seconds to a couple minutes at most, can occur at any time during the day or night.  No other associated symptoms.  Denies chest pain or dyspnea.  Past Medical History:  Diagnosis Date   Arthritis    shoulders and lower back per patient   Headache    Heart murmur    Hypertension    Thyroid disease    Past Surgical History:  Procedure Laterality Date   BREAST EXCISIONAL BIOPSY Left    BREAST LUMPECTOMY WITH RADIOACTIVE SEED LOCALIZATION Right 04/16/2021   Procedure: RIGHT BREAST LUMPECTOMY WITH RADIOACTIVE SEED LOCALIZATION;  Surgeon: Rolm Bookbinder, MD;  Location: [facility];  Service: General;  Laterality: Right;   CARPAL TUNNEL RELEASE Right    PILONIDAL CYST EXCISION     THYROID CYST EXCISION Right    TONSILLECTOMY     removed as a child   Family History  Problem Relation Age of Onset   Heart attack Mother    Heart failure Father    High blood pressure Brother    Cancer Maternal Grandmother 74       breast cancer   Breast cancer Maternal Grandmother     Social History   Tobacco Use   Smoking status: Never    Passive exposure: Yes   Smokeless tobacco: Never  Substance Use Topics   Alcohol use: No   Marital Status: Widowed  ROS  Review of Systems  Cardiovascular:  Positive for palpitations. Negative for chest pain,  dyspnea on exertion and leg swelling.   Objective  Blood pressure 134/84, pulse 68, temperature 97.6 F (36.4 C), temperature source Temporal, resp. rate 16, height $RemoveBe'5\' 1"'DDFohZbwJ$  (1.549 m), weight 145 lb 3.2 oz (65.9 kg), SpO2 98 %. Body mass index is 27.44 kg/m.     06/11/2022   12:47 PM 01/17/2022   11:24 AM 12/28/2021   12:16 PM  Vitals with BMI  Height $Remov'5\' 1"'ErJyDH$     Weight 145 lbs 3 oz 144 lbs 144 lbs 6 oz  BMI 87.68    Systolic 115 726 203  Diastolic 84 88 90  Pulse 68 73 82    Physical Exam Neck:     Vascular: No JVD.  Cardiovascular:     Rate and Rhythm: Normal rate and regular rhythm.     Pulses: Intact distal pulses.     Heart sounds: Murmur heard.     Harsh early systolic murmur is present with a grade of 2/6 at the upper right sternal border.     No gallop.  Pulmonary:     Effort: Pulmonary effort is normal.     Breath sounds: Normal breath sounds.  Abdominal:     General: Bowel sounds are normal.     Palpations: Abdomen is soft.  Musculoskeletal:  Right lower leg: No edema.     Left lower leg: No edema.     Medications and allergies   Allergies  Allergen Reactions   Alendronate Sodium Other (See Comments)   Felodipine     Anxiety    Vitamin D      Medication list after today's encounter   Current Outpatient Medications:    acetaminophen (TYLENOL) 325 MG tablet, Take 650 mg by mouth every 6 (six) hours as needed for moderate pain., Disp: , Rfl:    anastrozole (ARIMIDEX) 1 MG tablet, TAKE 1 TABLET(1 MG) BY MOUTH DAILY, Disp: 90 tablet, Rfl: 2   atorvastatin (LIPITOR) 20 MG tablet, Take 1 tablet (20 mg total) by mouth daily., Disp: 30 tablet, Rfl: 2   Cholecalciferol 100 MCG (4000 UT) CAPS, Take 1 capsule by mouth daily., Disp: , Rfl:    meloxicam (MOBIC) 15 MG tablet, Take 1 tablet by mouth as needed., Disp: , Rfl:    metoprolol succinate (TOPROL-XL) 25 MG 24 hr tablet, Take 1 tablet (25 mg total) by mouth every evening. Take with or immediately following a meal.,  Disp: 30 tablet, Rfl: 2   Multiple Vitamins-Minerals (HAIR FORMULA EXTRA STRENGTH PO), Take 1 tablet by mouth daily., Disp: , Rfl:    Omega-3 1000 MG CAPS, Take 1,000 mg by mouth daily., Disp: , Rfl:    OVER THE COUNTER MEDICATION, Apply 1 application topically at bedtime as needed (pain). Magnesium Cream, Disp: , Rfl:    Propylene Glycol (SYSTANE BALANCE) 0.6 % SOLN, Place 1 drop into both eyes daily as needed (dry eyes)., Disp: , Rfl:    telmisartan-hydrochlorothiazide (MICARDIS HCT) 80-12.5 MG tablet, Take 1 tablet by mouth daily., Disp: , Rfl:    valACYclovir (VALTREX) 500 MG tablet, TAKE 2 TABLETS(1000 MG) BY MOUTH TWICE DAILY (Patient taking differently: Take 500 mg by mouth 2 (two) times daily as needed (outbreaks).), Disp: 20 tablet, Rfl: 0  Laboratory examination:   Lab Results  Component Value Date   NA 141 12/28/2021   K 4.3 12/28/2021   CO2 32 12/28/2021   GLUCOSE 103 (H) 12/28/2021   BUN 16 12/28/2021   CREATININE 0.67 12/28/2021   CALCIUM 10.0 12/28/2021   GFRNONAA >60 12/28/2021       Latest Ref Rng & Units 12/28/2021   11:54 AM 04/10/2021   12:13 PM 10/29/2010   10:40 AM  CMP  Glucose 70 - 99 mg/dL 103  98  94   BUN 8 - 23 mg/dL $Remove'16  14  11   'ppmpbWT$ Creatinine 0.44 - 1.00 mg/dL 0.67  0.64  0.69   Sodium 135 - 145 mmol/L 141  138  145   Potassium 3.5 - 5.1 mmol/L 4.3  3.7  4.5   Chloride 98 - 111 mmol/L 104  102  106   CO2 22 - 32 mmol/L 32  28  30   Calcium 8.9 - 10.3 mg/dL 10.0  9.6  9.8   Total Protein 6.5 - 8.1 g/dL 6.9     Total Bilirubin 0.3 - 1.2 mg/dL 1.0     Alkaline Phos 38 - 126 U/L 77     AST 15 - 41 U/L 20     ALT 0 - 44 U/L 14         Latest Ref Rng & Units 12/28/2021   11:54 AM 06/21/2021   10:24 AM 06/07/2021   10:24 AM  CBC  WBC 4.0 - 10.5 K/uL 3.1  5.7  3.2   Hemoglobin  12.0 - 15.0 g/dL 13.6  14.2  14.2   Hematocrit 36.0 - 46.0 % 39.6  41.8  42.2   Platelets 150 - 400 K/uL 163  213  192     Lipid Panel No results for input(s): "CHOL", "TRIG",  "[place]", "VLDL", "HDL", "CHOLHDL", "LDLDIRECT" in the last 8760 hours.  HEMOGLOBIN A1C No results found for: "HGBA1C", "MPG" TSH No results for input(s): "TSH" in the last 8760 hours.  External labs:   Labs 12/18/2021:  BUN 23, creatinine 0.70, EGFR 98 mL, potassium 5.1, sodium 143.  LFTs normal.  Total cholesterol 250, triglycerides 44, HDL 98, LDL 143.  Non-HDL cholesterol 152.  Vitamin D 35.3.  TSH normal at 0.58.  Hb 14.0/HCT 42.3, platelets 172, normal indicis.  Radiology:   Coronary calcium score 01/11/2022: LM 0 LAD 134 LCx 55 RCA 149 Total Agatston score 338.  MESA database percentile 85. Acing aorta measures 35 mm and descending 25 mm, normal. There is calcification noted on the aortic valve.  Cardiac Studies:   NA  EKG:     EKG 06/11/2022: Normal sinus rhythm at the rate of 66 bpm, normal axis.  Left atrial enlargement.  No evidence of ischemia.  Assessment     ICD-10-CM   1. Rapid heartbeat  R00.0 EKG 12-Lead    metoprolol succinate (TOPROL-XL) 25 MG 24 hr tablet    2. Primary hypertension  I10 metoprolol succinate (TOPROL-XL) 25 MG 24 hr tablet    3. Elevated coronary artery calcium score 01/11/2022: Total Agatston score 338.  MESA database percentile 85.  R93.1 atorvastatin (LIPITOR) 20 MG tablet    4. Aortic ejection murmur  I35.1 PCV ECHOCARDIOGRAM COMPLETE    5. Hypercholesteremia  E78.00 atorvastatin (LIPITOR) 20 MG tablet    Lipid Panel With LDL/HDL Ratio    Lipoprotein A (LPA)       Orders Placed This Encounter  Procedures   Lipid Panel With LDL/HDL Ratio   Lipoprotein A (LPA)   EKG 12-Lead   PCV ECHOCARDIOGRAM COMPLETE    Standing Status:   Future    Standing Expiration Date:   06/12/2023    Meds ordered this encounter  Medications   atorvastatin (LIPITOR) 20 MG tablet    Sig: Take 1 tablet (20 mg total) by mouth daily.    Dispense:  30 tablet    Refill:  2   metoprolol succinate (TOPROL-XL) 25 MG 24 hr tablet    Sig: Take 1  tablet (25 mg total) by mouth every evening. Take with or immediately following a meal.    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  Symptoms started about a month ago after she had a trip to [place], at that time she also developed marked leg edema and amlodipine was discontinued with resolution of the edema.  However she has noticed frequent episodes of sudden onset rapid heartbeat lasting for about 30 seconds to a couple minutes at most, can occur at any time during the day or night.  No other associated symptoms.  Symptoms are nonspecific, could represent PACs or PVCs or  brief episodes of atrial tachycardia.  I would like to try metoprolol succinate 25 mg in the evening to see if her symptoms would improve, if not she will need EKG monitoring in the outpatient basis.  Her blood pressure is usually well controlled but has been labile since amlodipine was discontinued, hopefully addition of metoprolol will help in stabilizing her blood pressure.  I reviewed her previously performed labs and also coronary calcium score.  She is in the 85th percentile and needs primary prevention, after discussion she is willing to start statins, Lipitor 20 mg prescribed.  She has aortic valve calcification by CT and also aortic sclerotic murmur by physical exam, will obtain echocardiogram.  I will see her back in 6 to 8 weeks, she will obtain lipids prior to her next office visit.    Adrian Prows, MD, [facility] 06/11/2022, 7:22 PM Office: [phone]

## 2022-06-12 DIAGNOSIS — R931 Abnormal findings on diagnostic imaging of heart and coronary circulation: Secondary | ICD-10-CM | POA: Diagnosis not present

## 2022-06-12 DIAGNOSIS — I351 Nonrheumatic aortic (valve) insufficiency: Secondary | ICD-10-CM | POA: Diagnosis not present

## 2022-06-12 DIAGNOSIS — E7800 Pure hypercholesterolemia, unspecified: Secondary | ICD-10-CM | POA: Diagnosis not present

## 2022-06-12 DIAGNOSIS — R000 Tachycardia, unspecified: Secondary | ICD-10-CM | POA: Diagnosis not present

## 2022-06-12 DIAGNOSIS — I10 Essential (primary) hypertension: Secondary | ICD-10-CM | POA: Diagnosis not present

## 2022-06-24 ENCOUNTER — Telehealth: Payer: Self-pay

## 2022-06-24 DIAGNOSIS — E7800 Pure hypercholesterolemia, unspecified: Secondary | ICD-10-CM | POA: Diagnosis not present

## 2022-06-24 NOTE — Telephone Encounter (Signed)
Pt called to inform us that she was informed by you that she needed some labs done a week before her appt. Her appt is not until September and she had her blood drawn today. Pt wants to know does she need to get her blood drawn again before her appt in September. Please advise

## 2022-06-24 NOTE — Telephone Encounter (Signed)
Do not know if her cholesterol was checked.  If it was not checked then she needs to go to [facility] where I have already placed orders.  If cholesterol is checked, I will be able to see 1 when she comes to see me on the web.

## 2022-06-25 LAB — LIPID PANEL WITH LDL/HDL RATIO
Cholesterol, Total: 187 mg/dL (ref 100–199)
HDL: 92 mg/dL (ref >39)
LDL Chol Calc (NIH): 83 mg/dL (ref 0–99)
LDL/HDL Ratio: 0.9 ratio (ref 0.0–3.2)
Triglycerides: 61 mg/dL (ref 0–149)
VLDL Cholesterol Cal: 12 mg/dL (ref 5–40)

## 2022-06-25 LAB — LIPOPROTEIN A (LPA): Lipoprotein (a): 44.3 nmol/L (ref <75.0)

## 2022-06-25 NOTE — Telephone Encounter (Signed)
Spoke with patient and she is aware of Dr. Irven Shelling  message

## 2022-06-25 NOTE — Telephone Encounter (Signed)
Called pt no answer left a vm

## 2022-06-27 ENCOUNTER — Ambulatory Visit: Payer: Medicare Other | Admitting: Cardiology

## 2022-07-01 ENCOUNTER — Telehealth: Payer: Self-pay | Admitting: Hematology

## 2022-07-01 NOTE — Telephone Encounter (Signed)
Left message with rescheduled upcoming appointment due to provider's PAL. 

## 2022-07-05 ENCOUNTER — Other Ambulatory Visit: Payer: Medicare Other

## 2022-07-05 ENCOUNTER — Ambulatory Visit: Payer: Medicare Other | Admitting: Hematology

## 2022-07-16 ENCOUNTER — Other Ambulatory Visit: Payer: Self-pay | Admitting: Cardiology

## 2022-07-16 ENCOUNTER — Ambulatory Visit: Payer: Medicare Other

## 2022-07-16 DIAGNOSIS — R931 Abnormal findings on diagnostic imaging of heart and coronary circulation: Secondary | ICD-10-CM

## 2022-07-16 DIAGNOSIS — E7800 Pure hypercholesterolemia, unspecified: Secondary | ICD-10-CM

## 2022-07-16 DIAGNOSIS — I351 Nonrheumatic aortic (valve) insufficiency: Secondary | ICD-10-CM

## 2022-07-16 NOTE — Progress Notes (Signed)
Elevated coronary artery calcium score - Plan: Lipid Panel With LDL/HDL Ratio  Hypercholesteremia - Plan: Lipid Panel With LDL/HDL Ratio  Orders Placed This Encounter  Procedures   Lipid Panel With LDL/HDL Ratio    Standing Status:   Future    Standing Expiration Date:   07/17/2023

## 2022-07-18 ENCOUNTER — Encounter: Payer: Self-pay | Admitting: Radiation Oncology

## 2022-07-18 ENCOUNTER — Inpatient Hospital Stay: Payer: Medicare Other

## 2022-07-18 ENCOUNTER — Other Ambulatory Visit: Payer: Self-pay

## 2022-07-18 ENCOUNTER — Ambulatory Visit
Admission: RE | Admit: 2022-07-18 | Discharge: 2022-07-18 | Disposition: A | Discharge status: Home / Self Care | Payer: Medicare Other | Source: Ambulatory Visit | Attending: Radiation Oncology | Admitting: Radiation Oncology

## 2022-07-18 ENCOUNTER — Inpatient Hospital Stay: Payer: Medicare Other | Attending: Hematology | Admitting: Hematology

## 2022-07-18 VITALS — BP 132/88 | HR 62 | Temp 97.0°F | Resp 12 | Ht 61.0 in | Wt 145.5 lb

## 2022-07-18 DIAGNOSIS — C50511 Malignant neoplasm of lower-outer quadrant of right female breast: Secondary | ICD-10-CM | POA: Insufficient documentation

## 2022-07-18 DIAGNOSIS — Z79811 Long term (current) use of aromatase inhibitors: Secondary | ICD-10-CM | POA: Insufficient documentation

## 2022-07-18 DIAGNOSIS — Z923 Personal history of irradiation: Secondary | ICD-10-CM | POA: Insufficient documentation

## 2022-07-18 DIAGNOSIS — Z170 Estrogen receptor positive status [ER+]: Secondary | ICD-10-CM | POA: Insufficient documentation

## 2022-07-18 DIAGNOSIS — M25562 Pain in left knee: Secondary | ICD-10-CM | POA: Insufficient documentation

## 2022-07-18 DIAGNOSIS — Z79624 Long term (current) use of inhibitors of nucleotide synthesis: Secondary | ICD-10-CM | POA: Insufficient documentation

## 2022-07-18 DIAGNOSIS — Z79899 Other long term (current) drug therapy: Secondary | ICD-10-CM | POA: Insufficient documentation

## 2022-07-18 DIAGNOSIS — M8588 Other specified disorders of bone density and structure, other site: Secondary | ICD-10-CM | POA: Insufficient documentation

## 2022-07-18 NOTE — Progress Notes (Signed)
Radiation Oncology Follow up Note  Name: Tracy Horne   Date:   07/18/2022 MRN:  [id number] DOB: [birth year]    This 76 y.o. female presents to the clinic today for 1 year follow-up status post whole breast radiation to her right breast for stage Ia ER/PR positive invasive mammary carcinoma.  REFERRING PROVIDER: Deland Pretty, MD  HPI: Patient is a 76 year old female now at 1 year having completed whole breast radiation to her right breast for stage Ia (T1 aN0 M0) ER/PR positive invasive mammary carcinoma.  Seen today in routine follow-up she is doing well specifically denies breast tenderness cough or bone pain..  She has had mammograms in outside facility which I am having trouble tracking down although she states they were fine.  She is currently on Arimidex tolerating it well without side effect.  COMPLICATIONS OF TREATMENT: none  FOLLOW UP COMPLIANCE: keeps appointments   PHYSICAL EXAM:  BP 132/88   Pulse 62   Temp (!) 97 F (36.1 C) (Tympanic)   Resp 12   Ht '5\' 1"'$  (1.549 m)   Wt 145 lb 8 oz (66 kg)   BMI 27.49 kg/m  Lungs are clear to A&P cardiac examination essentially unremarkable with regular rate and rhythm. No dominant mass or nodularity is noted in either breast in 2 positions examined. Incision is well-healed. No axillary or supraclavicular adenopathy is appreciated. Cosmetic result is excellent.  Well-developed well-nourished patient in NAD. HEENT reveals PERLA, EOMI, discs not visualized.  Oral cavity is clear. No oral mucosal lesions are identified. Neck is clear without evidence of cervical or supraclavicular adenopathy. Lungs are clear to A&P. Cardiac examination is essentially unremarkable with regular rate and rhythm without murmur rub or thrill. Abdomen is benign with no organomegaly or masses noted. Motor sensory and DTR levels are equal and symmetric in the upper and lower extremities. Cranial nerves II through XII are grossly intact. Proprioception is intact. No  peripheral adenopathy or edema is identified. No motor or sensory levels are noted. Crude visual fields are within normal range.  RADIOLOGY RESULTS: No current films to review all films done at outside facility not showing up on care everywhere  PLAN: Present time patient is doing well with no evidence of disease now out 1 year.  Of asked to see her back in 1 year for follow-up.  I have asked her to copy me on her follow-up mammograms in the near future.  Patient knows to call with any concerns.  I would like to take this opportunity to thank you for allowing me to participate in the care of your patient.Noreene Filbert, MD

## 2022-07-31 ENCOUNTER — Ambulatory Visit: Payer: Medicare Other | Admitting: Cardiology

## 2022-08-02 LAB — LIPID PANEL WITH LDL/HDL RATIO
Cholesterol, Total: 172 mg/dL (ref 100–199)
HDL: 87 mg/dL (ref >39)
LDL Chol Calc (NIH): 74 mg/dL (ref 0–99)
LDL/HDL Ratio: 0.9 ratio (ref 0.0–3.2)
Triglycerides: 55 mg/dL (ref 0–149)
VLDL Cholesterol Cal: 11 mg/dL (ref 5–40)

## 2022-08-08 ENCOUNTER — Encounter: Payer: Self-pay | Admitting: Hematology

## 2022-08-08 ENCOUNTER — Other Ambulatory Visit: Payer: Self-pay

## 2022-08-08 ENCOUNTER — Inpatient Hospital Stay: Payer: Medicare Other

## 2022-08-08 ENCOUNTER — Inpatient Hospital Stay (HOSPITAL_BASED_OUTPATIENT_CLINIC_OR_DEPARTMENT_OTHER): Payer: Medicare Other | Admitting: Hematology

## 2022-08-08 VITALS — BP 124/82 | HR 68 | Temp 97.8°F | Resp 17 | Ht 61.0 in | Wt 145.1 lb

## 2022-08-08 DIAGNOSIS — Z923 Personal history of irradiation: Secondary | ICD-10-CM | POA: Diagnosis not present

## 2022-08-08 DIAGNOSIS — Z79624 Long term (current) use of inhibitors of nucleotide synthesis: Secondary | ICD-10-CM | POA: Insufficient documentation

## 2022-08-08 DIAGNOSIS — Z79811 Long term (current) use of aromatase inhibitors: Secondary | ICD-10-CM | POA: Insufficient documentation

## 2022-08-08 DIAGNOSIS — C50511 Malignant neoplasm of lower-outer quadrant of right female breast: Secondary | ICD-10-CM

## 2022-08-08 DIAGNOSIS — Z170 Estrogen receptor positive status [ER+]: Secondary | ICD-10-CM

## 2022-08-08 DIAGNOSIS — M25562 Pain in left knee: Secondary | ICD-10-CM | POA: Insufficient documentation

## 2022-08-08 DIAGNOSIS — M8588 Other specified disorders of bone density and structure, other site: Secondary | ICD-10-CM | POA: Insufficient documentation

## 2022-08-08 DIAGNOSIS — Z79899 Other long term (current) drug therapy: Secondary | ICD-10-CM | POA: Insufficient documentation

## 2022-08-08 LAB — CMP (CANCER CENTER ONLY)
ALT: 19 U/L (ref 0–44)
AST: 21 U/L (ref 15–41)
Albumin: 4.7 g/dL (ref 3.5–5.0)
Alkaline Phosphatase: 88 U/L (ref 38–126)
Anion gap: 8 (ref 5–15)
BUN: 18 mg/dL (ref 8–23)
CO2: 31 mmol/L (ref 22–32)
Calcium: 10.4 mg/dL — ABNORMAL HIGH (ref 8.9–10.3)
Chloride: 101 mmol/L (ref 98–111)
Creatinine: 0.65 mg/dL (ref 0.44–1.00)
GFR, Estimated: >60 mL/min (ref >60)
Glucose, Bld: 97 mg/dL (ref 70–99)
Potassium: 4.1 mmol/L (ref 3.5–5.1)
Sodium: 140 mmol/L (ref 135–145)
Total Bilirubin: 1.2 mg/dL (ref 0.3–1.2)
Total Protein: 7.5 g/dL (ref 6.5–8.1)

## 2022-08-08 LAB — CBC WITH DIFFERENTIAL (CANCER CENTER ONLY)
Abs Immature Granulocytes: 0.02 10*3/uL (ref 0.00–0.07)
Basophils Absolute: 0 10*3/uL (ref 0.0–0.1)
Basophils Relative: 1 %
Eosinophils Absolute: 0 10*3/uL (ref 0.0–0.5)
Eosinophils Relative: 1 %
HCT: 41.2 % (ref 36.0–46.0)
Hemoglobin: 14.3 g/dL (ref 12.0–15.0)
Immature Granulocytes: 1 %
Lymphocytes Relative: 25 %
Lymphs Abs: 1 10*3/uL (ref 0.7–4.0)
MCH: 31 pg (ref 26.0–34.0)
MCHC: 34.7 g/dL (ref 30.0–36.0)
MCV: 89.2 fL (ref 80.0–100.0)
Monocytes Absolute: 0.3 10*3/uL (ref 0.1–1.0)
Monocytes Relative: 8 %
Neutro Abs: 2.5 10*3/uL (ref 1.7–7.7)
Neutrophils Relative %: 64 %
Platelet Count: 189 10*3/uL (ref 150–400)
RBC: 4.62 MIL/uL (ref 3.87–5.11)
RDW: 12.6 % (ref 11.5–15.5)
WBC Count: 3.9 10*3/uL — ABNORMAL LOW (ref 4.0–10.5)
nRBC: 0 % (ref 0.0–0.2)

## 2022-08-08 MED ORDER — MELOXICAM 7.5 MG PO TABS: 7.5000 mg | ORAL_TABLET | Freq: Every day | ORAL | 0 refills | Status: DC

## 2022-08-08 NOTE — Progress Notes (Signed)
[place]   [phone] [phone]   Clinic Follow up Note   Patient Care Team: Deland Pretty, MD as PCP - General (Internal Medicine) Valinda Party, MD (Rheumatology) Rockwell Germany, RN as Oncology Nurse Navigator Mauro Kaufmann, RN as Oncology Nurse Navigator Rolm Bookbinder, MD as Consulting Physician (General Surgery) Alla Feeling, NP as Nurse Practitioner (Nurse Practitioner) Truitt Merle, MD as Consulting Physician (Hematology)  Date of Service:  08/08/2022  CHIEF COMPLAINT: f/u of right breast cancer  CURRENT THERAPY:  Anastrozole, started 07/2021  ASSESSMENT & PLAN:  Tracy Horne is a 76 y.o. post-menopausal female with   1. 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 There was progression on 03/13/21 mammogram. 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  -s/p right lumpectomy on 04/16/21 with Dr Donne Hazel, pathology showed a 0.15cm grade I invasive ductal carcinoma and components of high grade DCIS. -s/p radiation therapy 05/28/21 - 06/28/21 -she started anastrozole in 07/2021, plan for 5 years. She is tolerating well overall, but she has developed left knee pain, which is new to her. I recommend holding anastrozole for 3 months to see if her pain improves. -she reports diffuse left breast tenderness. She is overdue for mammogram (we will check with [facility] to make sure she hasn't had one recently). Physical exam was unremarkable for nodules. There is no clinical concern for recurrence. -phone visit in 3 months to see how she is doing off anastrozole. If her knee pain and rib tenderness improves, I may switch her to tamoxifen    2. Bone Health  -DEXA on 05/08/21 showed osteopenia (-2.1 in L1-4) -she reports she has tried alendronate but experienced increasing bone pain. She endorses taking vit D. -she  previously declined any kind of bone medication today.    3. New left knee pain -developed since starting anastrozole -she previously took meloxicam for carpal tunnel. I will refill for her today. -will hold anastrozole for 3 months to see if this improved.     PLAN:  -hold anastrozole -phone visit in 3 months -lab and f/u in 6 months   No problem-specific Assessment & Plan notes found for this encounter.   SUMMARY OF ONCOLOGIC HISTORY: Oncology History Overview Note  Cancer Staging Malignant neoplasm of lower-outer quadrant of right breast of female, estrogen receptor positive ([place]) Staging form: Breast, AJCC 8th Edition - Pathologic stage from 04/16/2021: Stage Unknown (pT1a, pNX, G1, ER+, PR-, HER2+) - Signed by Truitt Merle, MD on 05/06/2021 Stage prefix: Initial diagnosis Multigene prognostic tests performed: None Histologic grading system: 3 grade system Residual tumor (R): R0 - None    Malignant neoplasm of lower-outer quadrant of right breast of female, estrogen receptor positive ([place])  08/16/2020 Pathology Results   Diagnosis Breast, right, needle core biopsy - LOBULAR NEOPLASIA (ATYPICAL LOBULAR HYPERPLASIA) WITH MICROCALCIFICATIONS - SEE COMMENT Microscopic Comment These results were called to [facility] on August 17, 2020.   03/13/2021 Mammogram   Right mammogram 03/13/21 There are pleomorphic calcifications spanning 1.6cm in the slightly linear configuration within the right lower outer breast anterior to middle depth 2.7cm from the nipple. These are increased in number. No other significant masses or calcifications.    Left Mammogram on 03/20/21  Negative    03/20/2021 Initial Biopsy   Diagnosis Breast, right, needle core biopsy, lower outer, 2.7cmfn, anterior depth - MAMMARY CARCINOMA IN  SITU WITH CALCIFICATIONS AND NECROSIS. - LOBULAR NEOPLASIA (ATYPICAL LOBULAR HYPERPLASIA). Microscopic Comment The carcinoma appears high grade. E-cadherin is pending and  will be reported in an addendum. Dr. Saralyn Pilar has reviewed the case. The case was called to Dr. Luan Pulling on 03/21/2021.  E-cadherin is positive in the areas of in situ carcinoma and thus, these areas are consistent with high grade ductal carcinoma in situ. E-cadherin is negative in areas of atypical lobular hyperplasia.   04/16/2021 Cancer Staging   Staging form: Breast, AJCC 8th Edition - Pathologic stage from 04/16/2021: Stage Unknown (pT1a, pNX, G1, ER+, PR-, HER2+) - Signed by Truitt Merle, MD on 05/06/2021 Stage prefix: Initial diagnosis Multigene prognostic tests performed: None Histologic grading system: 3 grade system Residual tumor (R): R0 - None   04/16/2021 Surgery   RIGHT BREAST LUMPECTOMY WITH RADIOACTIVE SEED LOCALIZATION by Dr Donne Hazel    FINAL MICROSCOPIC DIAGNOSIS:   A. 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   All controls stained appropriately.    05/06/2021 Initial Diagnosis   Malignant neoplasm of lower-outer quadrant of right breast of female, estrogen receptor positive ([place])   05/24/2021 - 06/28/2021 Radiation Therapy   Whole breast radiation by Dr. Noreene Filbert    07/2021 -  Anti-estrogen oral therapy   Adjuvant Anastrozole   10/30/2021 Survivorship   SCP delivered by Cira Rue, NP      INTERVAL HISTORY:   Tracy Horne is here for a follow up of breast cancer. She was last seen by me on 12/28/21. She presents to the clinic alone. She reports new left knee pain and tenderness, denies injury. She also reports diffuse tenderness to her left breast.   All other systems were reviewed with the patient and are negative.  MEDICAL HISTORY:  Past Medical History:  Diagnosis Date   Arthritis    shoulders and lower back per patient   Headache    Heart murmur    Hypertension    Thyroid disease     SURGICAL HISTORY: Past Surgical History:  Procedure Laterality Date   BREAST EXCISIONAL BIOPSY Left    BREAST LUMPECTOMY WITH RADIOACTIVE SEED LOCALIZATION Right 04/16/2021   Procedure: RIGHT BREAST LUMPECTOMY WITH RADIOACTIVE SEED LOCALIZATION;  Surgeon: Rolm Bookbinder, MD;  Location: [place];  Service: General;  Laterality: Right;   CARPAL TUNNEL RELEASE Right    PILONIDAL CYST EXCISION     THYROID CYST EXCISION Right    TONSILLECTOMY     removed as a child    I have reviewed the social history and family history with the patient and they are unchanged from previous note.  ALLERGIES:  is allergic to alendronate sodium, felodipine, and vitamin d.  MEDICATIONS:  Current Outpatient Medications  Medication Sig Dispense  Refill   meloxicam (MOBIC) 7.5 MG tablet Take 1-2 tablets (7.5-15 mg total) by mouth daily. 40 tablet 0   acetaminophen (TYLENOL) 325 MG tablet Take 650 mg by mouth every 6 (six) hours as needed for moderate pain.     anastrozole (ARIMIDEX) 1 MG tablet TAKE 1 TABLET(1 MG) BY MOUTH DAILY 90 tablet 2   atorvastatin (LIPITOR) 20 MG tablet Take 1 tablet (20 mg total) by mouth daily. 30 tablet 2   Cholecalciferol 100 MCG (4000 UT) CAPS Take 1 capsule by mouth daily.     metoprolol succinate (TOPROL-XL) 25 MG 24 hr tablet Take 1 tablet (25 mg total) by mouth every evening. Take with or immediately following a meal. 30 tablet 2   Multiple Vitamins-Minerals (HAIR FORMULA EXTRA  STRENGTH PO) Take 1 tablet by mouth daily.     Omega-3 1000 MG CAPS Take 1,000 mg by mouth daily.     OVER THE COUNTER MEDICATION Apply 1 application topically at bedtime as needed (pain). Magnesium Cream     Propylene Glycol (SYSTANE BALANCE) 0.6 % SOLN Place 1 drop into both eyes daily as needed (dry eyes).     telmisartan-hydrochlorothiazide (MICARDIS HCT) 80-12.5 MG tablet Take 1 tablet by mouth daily.     valACYclovir (VALTREX) 500 MG tablet TAKE 2 TABLETS(1000 MG) BY MOUTH TWICE DAILY (Patient taking differently: Take 500 mg by mouth 2 (two) times daily as needed (outbreaks).) 20 tablet 0   No current facility-administered medications for this visit.    PHYSICAL EXAMINATION: ECOG PERFORMANCE STATUS: 1 - Symptomatic but completely ambulatory  Vitals:   08/08/22 1347  BP: 124/82  Pulse: 68  Resp: 17  Temp: 97.8 F (36.6 C)  SpO2: 100%   Wt Readings from Last 3 Encounters:  08/08/22 145 lb 1.6 oz (65.8 kg)  07/18/22 145 lb 8 oz (66 kg)  06/11/22 145 lb 3.2 oz (65.9 kg)     GENERAL:alert, no distress and comfortable SKIN: skin color, texture, turgor are normal, no rashes or significant lesions EYES: normal, Conjunctiva are pink and non-injected, sclera clear  NECK: supple, thyroid normal size, non-tender, without nodularity LYMPH:  no palpable lymphadenopathy in the cervical, axillary LUNGS: clear to auscultation and percussion with normal breathing effort HEART: regular rate & rhythm and no murmurs and no lower extremity edema ABDOMEN:abdomen soft, non-tender and normal bowel sounds Musculoskeletal:no cyanosis of digits and no clubbing, (+) tenderness to left knee NEURO: alert & oriented x 3 with fluent speech, no focal motor/sensory deficits BREAST: (+) left breast diffuse tenderness. No palpable mass, nodules or adenopathy bilaterally. Breast exam benign.   LABORATORY DATA:  I have reviewed the data as listed    Latest Ref Rng & Units 08/08/2022    1:12 PM 12/28/2021    11:54 AM 06/21/2021   10:24 AM  CBC  WBC 4.0 - 10.5 K/uL 3.9  3.1  5.7   Hemoglobin 12.0 - 15.0 g/dL 14.3  13.6  14.2   Hematocrit 36.0 - 46.0 % 41.2  39.6  41.8   Platelets 150 - 400 K/uL 189  163  213         Latest Ref Rng & Units 08/08/2022    1:12 PM 12/28/2021   11:54 AM 04/10/2021   12:13 PM  CMP  Glucose 70 - 99 mg/dL 97  103  98   BUN 8 - 23 mg/dL _0 Creatinine 0.44 - 1.00 mg/dL 0.65  0.67  0.64   Sodium  135 - 145 mmol/L 140  141  138   Potassium 3.5 - 5.1 mmol/L 4.1  4.3  3.7   Chloride 98 - 111 mmol/L 101  104  102   CO2 22 - 32 mmol/L 31  32  28   Calcium 8.9 - 10.3 mg/dL 10.4  10.0  9.6   Total Protein 6.5 - 8.1 g/dL 7.5  6.9    Total Bilirubin 0.3 - 1.2 mg/dL 1.2  1.0    Alkaline Phos 38 - 126 U/L 88  77    AST 15 - 41 U/L 21  20    ALT 0 - 44 U/L 19  14        RADIOGRAPHIC STUDIES: I have personally reviewed the radiological images as listed and agreed with the findings in the report. No results found.    Orders Placed This Encounter  Procedures   MM DIAG BREAST TOMO BILATERAL    Standing Status:   Future    Standing Expiration Date:   08/09/2023    Scheduling Instructions:     [facility]    Order Specific Question:   Reason for Exam (SYMPTOM  OR DIAGNOSIS REQUIRED)    Answer:   screening    Order Specific Question:   Preferred imaging location?    Answer:   External   All questions were answered. The patient knows to call the clinic with any problems, questions or concerns. No barriers to learning was detected. The total time spent in the appointment was 30 minutes.     Truitt Merle, MD 08/08/2022   I, Wilburn Mylar, am acting as scribe for Truitt Merle, MD.   I have reviewed the above documentation for accuracy and completeness, and I agree with the above.

## 2022-08-09 ENCOUNTER — Ambulatory Visit: Payer: Medicare Other | Admitting: Cardiology

## 2022-08-09 ENCOUNTER — Encounter: Payer: Self-pay | Admitting: Cardiology

## 2022-08-09 VITALS — BP 117/74 | HR 77 | Temp 98.0°F | Resp 16 | Ht 61.0 in | Wt 146.8 lb

## 2022-08-09 DIAGNOSIS — R931 Abnormal findings on diagnostic imaging of heart and coronary circulation: Secondary | ICD-10-CM

## 2022-08-09 DIAGNOSIS — R000 Tachycardia, unspecified: Secondary | ICD-10-CM | POA: Diagnosis not present

## 2022-08-09 DIAGNOSIS — R011 Cardiac murmur, unspecified: Secondary | ICD-10-CM | POA: Diagnosis not present

## 2022-08-09 DIAGNOSIS — I10 Essential (primary) hypertension: Secondary | ICD-10-CM

## 2022-08-09 DIAGNOSIS — E7800 Pure hypercholesterolemia, unspecified: Secondary | ICD-10-CM

## 2022-08-09 MED ORDER — METOPROLOL SUCCINATE ER 25 MG PO TB24: 25.0000 mg | ORAL_TABLET | Freq: Every evening | ORAL | 2 refills | Status: DC

## 2022-08-09 MED ORDER — METOPROLOL SUCCINATE ER 25 MG PO TB24: 25.0000 mg | ORAL_TABLET | Freq: Every evening | ORAL | 3 refills | Status: DC

## 2022-08-09 MED ORDER — ATORVASTATIN CALCIUM 20 MG PO TABS: 20.0000 mg | ORAL_TABLET | Freq: Every day | ORAL | 3 refills | Status: DC

## 2022-08-09 MED ORDER — ATORVASTATIN CALCIUM 20 MG PO TABS: 20.0000 mg | ORAL_TABLET | Freq: Every day | ORAL | 2 refills | Status: DC

## 2022-08-09 NOTE — Progress Notes (Signed)
Primary Physician/Referring:  Deland Pretty, MD  Patient ID: Tracy Horne, female    DOB: 1946/06/24, 76 y.o.   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  Symptoms started about a month ago after she had a trip to [place], at that time she also developed marked leg edema and amlodipine was discontinued with resolution of the edema.  However she had developed PACs and PVCs and symptomatic palpitations.  On her last office visit I had ordered metoprolol succinate 25 mg daily, also in view of elevated coronary calcium score added atorvastatin 20 mg daily which she is tolerating.  She is essentially asymptomatic   Past Medical History:  Diagnosis Date   Arthritis    shoulders and lower back per patient   Headache    Heart murmur    Hypertension    Thyroid disease    Past Surgical History:  Procedure Laterality Date   BREAST EXCISIONAL BIOPSY Left    BREAST LUMPECTOMY WITH RADIOACTIVE SEED LOCALIZATION Right 04/16/2021   Procedure: RIGHT BREAST LUMPECTOMY WITH RADIOACTIVE SEED LOCALIZATION;  Surgeon: Rolm Bookbinder, MD;  Location: [place];  Service: General;  Laterality: Right;   CARPAL TUNNEL RELEASE Right    PILONIDAL CYST EXCISION     THYROID CYST EXCISION Right    TONSILLECTOMY     removed as a child   Family History  Problem Relation Age of Onset   Heart attack Mother    Heart failure Father    High blood pressure Brother    Cancer Maternal Grandmother 75       breast cancer   Breast cancer Maternal Grandmother     Social History   Tobacco Use   Smoking status: Never    Passive exposure: Yes   Smokeless tobacco: Never  Substance Use Topics   Alcohol use: No   Marital Status: Widowed  ROS  Review of Systems  Cardiovascular:  Negative for chest pain, dyspnea on exertion, leg swelling and  palpitations.   Objective  Blood pressure 117/74, pulse 77, temperature 98 F (36.7 C), temperature source Temporal, resp. rate 16, height _0  (1.549 m), weight 146 lb 12.8 oz (66.6 kg), SpO2 98 %. Body mass index is 27.74 kg/m.     08/09/2022    9:37 AM 08/08/2022    1:47 PM 07/18/2022   10:06 AM  Vitals with BMI  Height _1  _2  _3   Weight 146 lbs 13 oz 145 lbs 2 oz 145 lbs 8 oz  BMI 27.75 57.90 38.33  Systolic 383 291 916  Diastolic 74 82 88  Pulse 77 68 62    Physical Exam Neck:     Vascular: No JVD.  Cardiovascular:     Rate and Rhythm: Normal rate and regular rhythm.     Pulses: Intact distal pulses.     Heart sounds: Murmur heard.     Harsh early systolic murmur is present with a grade of 2/6 at the upper right sternal border.     No gallop.  Pulmonary:     Effort: Pulmonary effort is normal.     Breath sounds: Normal breath sounds.  Abdominal:     General: Bowel sounds are normal.     Palpations: Abdomen is soft.  Musculoskeletal:     Right lower leg:  No edema.     Left lower leg: No edema.     Medications and allergies   Allergies  Allergen Reactions   Alendronate Sodium Other (See Comments)   Felodipine     Anxiety    Vitamin D      Medication list after today's encounter   Current Outpatient Medications:    acetaminophen (TYLENOL) 325 MG tablet, Take 650 mg by mouth every 6 (six) hours as needed for moderate pain., Disp: , Rfl:    anastrozole (ARIMIDEX) 1 MG tablet, TAKE 1 TABLET(1 MG) BY MOUTH DAILY, Disp: 90 tablet, Rfl: 2   Cholecalciferol 100 MCG (4000 UT) CAPS, Take 1 capsule by mouth daily., Disp: , Rfl:    meloxicam (MOBIC) 7.5 MG tablet, Take 1-2 tablets (7.5-15 mg total) by mouth daily., Disp: 40 tablet, Rfl: 0   Multiple Vitamins-Minerals (HAIR FORMULA EXTRA STRENGTH PO), Take 1 tablet by mouth daily., Disp: , Rfl:    Omega-3 1000 MG CAPS, Take 1,000 mg by mouth daily., Disp: , Rfl:    OVER THE COUNTER MEDICATION, Apply 1 application  topically at bedtime as needed (pain). Magnesium Cream, Disp: , Rfl:    Propylene Glycol (SYSTANE BALANCE) 0.6 % SOLN, Place 1 drop into both eyes daily as needed (dry eyes)., Disp: , Rfl:    telmisartan-hydrochlorothiazide (MICARDIS HCT) 80-12.5 MG tablet, Take 1 tablet by mouth daily., Disp: , Rfl:    valACYclovir (VALTREX) 500 MG tablet, TAKE 2 TABLETS(1000 MG) BY MOUTH TWICE DAILY (Patient taking differently: Take 500 mg by mouth 2 (two) times daily as needed (outbreaks).), Disp: 20 tablet, Rfl: 0   atorvastatin (LIPITOR) 20 MG tablet, Take 1 tablet (20 mg total) by mouth daily., Disp: 90 tablet, Rfl: 3   metoprolol succinate (TOPROL-XL) 25 MG 24 hr tablet, Take 1 tablet (25 mg total) by mouth every evening. Take with or immediately following a meal., Disp: 90 tablet, Rfl: 3  Laboratory examination:   Lab Results  Component Value Date   NA 140 08/08/2022   K 4.1 08/08/2022   CO2 31 08/08/2022   GLUCOSE 97 08/08/2022   BUN 18 08/08/2022   CREATININE 0.65 08/08/2022   CALCIUM 10.4 (H) 08/08/2022   GFRNONAA >60 08/08/2022       Latest Ref Rng & Units 08/08/2022    1:12 PM 12/28/2021   11:54 AM 04/10/2021   12:13 PM  CMP  Glucose 70 - 99 mg/dL 97  103  98   BUN 8 - 23 mg/dL _0 Creatinine 0.44 - 1.00 mg/dL 0.65  0.67  0.64   Sodium 135 - 145 mmol/L 140  141  138   Potassium 3.5 - 5.1 mmol/L 4.1  4.3  3.7   Chloride 98 - 111 mmol/L 101  104  102   CO2 22 - 32 mmol/L 31  32  28   Calcium 8.9 - 10.3 mg/dL 10.4  10.0  9.6   Total Protein 6.5 - 8.1 g/dL 7.5  6.9    Total Bilirubin 0.3 - 1.2 mg/dL 1.2  1.0    Alkaline Phos 38 - 126 U/L 88  77    AST 15 - 41 U/L 21  20    ALT 0 - 44 U/L 19  14        Latest Ref Rng & Units 08/08/2022    1:12 PM 12/28/2021   11:54 AM 06/21/2021   10:24 AM  CBC  WBC 4.0 - 10.5 K/uL 3.9  3.1  5.7   Hemoglobin 12.0 - 15.0 g/dL 14.3  13.6  14.2   Hematocrit 36.0 - 46.0 % 41.2  39.6  41.8   Platelets 150 - 400 K/uL 189  163  213     Lipid  Panel Recent Labs    06/24/22 1111 08/01/22 1015  CHOL 187 172  TRIG 61 55  LDLCALC 83 74  HDL 92 87    HEMOGLOBIN A1C No results found for: "HGBA1C", "MPG" TSH No results for input(s): "TSH" in the last 8760 hours.  External labs:   Labs 05/27/2022:  Hb 13.5/HCT 39.4, platelets 226, normal indicis.  BUN 16, creatinine 0.70, EGFR 97 mL. Potassium 4.6.  Labs 12/18/2021:  BUN 23, creatinine 0.70, EGFR 98 mL, potassium 5.1, sodium 143.  LFTs normal.  Total cholesterol 250, triglycerides 44, HDL 98, LDL 143.  Non-HDL cholesterol 152.  Vitamin D 35.3.  TSH normal at 0.58.  Hb 14.0/HCT 42.3, platelets 172, normal indicis.  Radiology:   Coronary calcium score 01/11/2022: LM 0 LAD 134 LCx 55 RCA 149 Total Agatston score 338.  MESA database percentile 85. Ascending aorta measures 35 mm and descending 25 mm, normal. There is calcification noted on the aortic valve.  Cardiac Studies:    PCV ECHOCARDIOGRAM COMPLETE 07/16/2022  Narrative Echocardiogram 07/16/2022: Normal LV systolic function with visual EF 60-65%. Left ventricle cavity is normal in size. Normal global wall motion. Doppler evidence of grade I (impaired) diastolic dysfunction, normal LAP. Calculated EF 67%. Structurally normal mitral valve.  Mild (Grade I) mitral regurgitation. Structurally normal tricuspid valve.  Mild tricuspid regurgitation. No evidence of pulmonary hypertension. no prior available for comparison     EKG:     EKG 06/11/2022: Normal sinus rhythm at the rate of 66 bpm, normal axis.  Left atrial enlargement.  No evidence of ischemia.  Assessment     ICD-10-CM   1. Elevated coronary artery calcium score 01/11/2022: Total Agatston score 338.  MESA database percentile 85.  R93.1 PCV CARDIAC STRESS TEST    atorvastatin (LIPITOR) 20 MG tablet    DISCONTINUED: atorvastatin (LIPITOR) 20 MG tablet    2. Hypercholesteremia  E78.00 atorvastatin (LIPITOR) 20 MG tablet    DISCONTINUED:  atorvastatin (LIPITOR) 20 MG tablet    3. Primary hypertension  I10 metoprolol succinate (TOPROL-XL) 25 MG 24 hr tablet    DISCONTINUED: metoprolol succinate (TOPROL-XL) 25 MG 24 hr tablet    4. Systolic murmur  A56.9     5. Rapid heartbeat  R00.0        Orders Placed This Encounter  Procedures   PCV CARDIAC STRESS TEST    Standing Status:   Future    Standing Expiration Date:   10/09/2022    Meds ordered this encounter  Medications   DISCONTD: atorvastatin (LIPITOR) 20 MG tablet    Sig: Take 1 tablet (20 mg total) by mouth daily.    Dispense:  30 tablet    Refill:  2   DISCONTD: metoprolol succinate (TOPROL-XL) 25 MG 24 hr tablet    Sig: Take 1 tablet (25 mg total) by mouth every evening. Take with or immediately following a meal.    Dispense:  30 tablet    Refill:  2   atorvastatin (LIPITOR) 20 MG tablet    Sig: Take 1 tablet (20 mg total) by mouth daily.    Dispense:  90 tablet    Refill:  3   metoprolol succinate (TOPROL-XL) 25 MG 24 hr tablet  Sig: Take 1 tablet (25 mg total) by mouth every evening. Take with or immediately following a meal.    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  Symptoms started about a month ago after she had a trip to [place], at that time she also developed marked leg edema and amlodipine was discontinued with resolution of the edema.  However she had developed PACs and PVCs and symptomatic palpitations.  On her last office visit I had ordered metoprolol succinate 25 mg daily, also in view of elevated coronary calcium score added  atorvastatin 20 mg daily which she is tolerating.  She is essentially asymptomatic with complete resolution of palpitations and also tolerating Lipitor without any side effects.  Reviewed her lipid profile, excellent control.  Systolic murmur probably related to mild MR and TR of no clinical consequence.  I simply reassured her.  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  She also has mild calcification of the aortic valve noted by echocardiogram and also by chest CT.  No further evaluation is indicated unless there is change in murmur or she develops any symptoms.  In view of coronary calcium score that is elevated, hypertension and hyperlipidemia as risk factors, I will schedule her for a routine treadmill exercise stress test. I will personally perform the test and if I find abnormalities,  will perform further evaluation. Otherwise unless new on ongoing symptoms(patient advised to contact us), preventive  therapy is recommended. I will then see the patient on a PRN basis.      Adrian Prows, MD, [facility] 08/09/2022, 10:46 AM Office: [phone]

## 2022-08-22 ENCOUNTER — Ambulatory Visit: Payer: Medicare Other

## 2022-08-22 DIAGNOSIS — R931 Abnormal findings on diagnostic imaging of heart and coronary circulation: Secondary | ICD-10-CM | POA: Diagnosis not present

## 2022-08-25 NOTE — Progress Notes (Signed)
Treadmill Exercise Stress 08/22/2022: The patient exercised for 4 minutes and 13 seconds on Bruce protocol; achieved 6.09 METs at 90% of maximum predicted heart rate.  Chest pain is not present.  The heart rate response was normal.  Exercise capacity reduced. The baseline blood pressure was 140/80 mmHg and increased to 160/100 mmHg at peak exercise. Rare PAC and PVC.  Low risk.

## 2022-08-29 ENCOUNTER — Other Ambulatory Visit: Payer: Self-pay | Admitting: Hematology

## 2022-09-17 DIAGNOSIS — Z23 Encounter for immunization: Secondary | ICD-10-CM | POA: Diagnosis not present

## 2022-10-01 DIAGNOSIS — Z23 Encounter for immunization: Secondary | ICD-10-CM | POA: Diagnosis not present

## 2022-10-16 DIAGNOSIS — Z853 Personal history of malignant neoplasm of breast: Secondary | ICD-10-CM | POA: Diagnosis not present

## 2022-10-16 DIAGNOSIS — R922 Inconclusive mammogram: Secondary | ICD-10-CM | POA: Diagnosis not present

## 2022-11-12 ENCOUNTER — Telehealth: Payer: Self-pay | Admitting: Hematology

## 2022-11-12 ENCOUNTER — Inpatient Hospital Stay: Payer: Medicare Other | Attending: Hematology | Admitting: Hematology

## 2022-11-12 ENCOUNTER — Encounter: Payer: Self-pay | Admitting: Hematology

## 2022-11-12 DIAGNOSIS — C50511 Malignant neoplasm of lower-outer quadrant of right female breast: Secondary | ICD-10-CM

## 2022-11-12 DIAGNOSIS — Z170 Estrogen receptor positive status [ER+]: Secondary | ICD-10-CM | POA: Diagnosis not present

## 2022-11-12 MED ORDER — ANASTROZOLE 1 MG PO TABS: ORAL_TABLET | ORAL | 2 refills | Status: DC

## 2022-11-12 NOTE — Progress Notes (Addendum)
Tracy Horne   [phone] [phone]   Clinic Follow up Note   Patient Care Team: Deland Pretty, MD as PCP - General (Internal Medicine) Valinda Party, MD (Rheumatology) Rockwell Germany, RN as Oncology Nurse Navigator Mauro Kaufmann, RN as Oncology Nurse Navigator Rolm Bookbinder, MD as Consulting Physician (General Surgery) Alla Feeling, NP as Nurse Practitioner (Nurse Practitioner) Truitt Merle, MD as Consulting Physician (Hematology) 11/12/2022  I connected with Tracy Horne on 11/12/22 at 12:40 PM EST by telephone and verified that I am speaking with the correct person using two identifiers.   I discussed the limitations, risks, security and privacy concerns of performing an evaluation and management service by telephone and the availability of in person appointments. I also discussed with the patient that there may be a patient responsible charge related to this service. The patient expressed understanding and agreed to proceed.   Patient's location:  Home  Provider's location:  Office    CHIEF COMPLAINT: f/u right breast cancer    CURRENT THERAPY: restarting anastrozole   ASSESSMENT & PLAN:  Tracy Horne is a 77 y.o. post-menopausal female with    1. 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  -s/p right lumpectomy on 04/16/21 with Dr Donne Hazel, pathology showed a 0.15cm grade I invasive ductal carcinoma and components of high grade DCIS. -s/p radiation therapy 05/28/21 - 06/28/21 -she started anastrozole in 07/2021, held in 07/2022 due to her left knee pain  -Her left knee pain is overall stable, no significant change since she came off anastrozole.  She would like to restart anastrozole.  I again reviewed the option of tamoxifen, she is concerned about potential risk of thrombosis from  tamoxifen, and preferred to restart anastrozole.  I called in today.   2. Bone Health  -DEXA on 05/08/21 showed osteopenia (-2.1 in L1-4) -she reports she has tried alendronate but experienced increasing bone pain. She endorses taking vit D. -she previously declined any kind of bone medication     Plan -Patient would like to restart anastrozole, I refilled for her today -We discussed healthy diet and exercise to help her lose weight. -Lab and follow-up in 3 months as scheduled   SUMMARY OF ONCOLOGIC HISTORY: Oncology History Overview Note  Cancer Staging Malignant neoplasm of lower-outer quadrant of right breast of female, estrogen receptor positive ([place]) Staging form: Breast, AJCC 8th Edition - Pathologic stage from 04/16/2021: Stage Unknown (pT1a, pNX, G1, ER+, PR-, HER2+) - Signed by Truitt Merle, MD on 05/06/2021 Stage prefix: Initial diagnosis Multigene prognostic tests performed: None Histologic grading system: 3 grade system Residual tumor (R): R0 - None    Malignant neoplasm of lower-outer quadrant of right breast of female, estrogen receptor positive ([place])  08/16/2020 Pathology Results   Diagnosis Breast, right, needle core biopsy - LOBULAR NEOPLASIA (ATYPICAL LOBULAR HYPERPLASIA) WITH MICROCALCIFICATIONS - SEE COMMENT Microscopic Comment These results were called to [facility] on August 17, 2020.   03/13/2021 Mammogram   Right mammogram 03/13/21 There are pleomorphic calcifications spanning 1.6cm in the slightly linear configuration within the right lower outer breast anterior to middle depth 2.7cm from the nipple. These are increased in number. No other significant masses or calcifications.    Left Mammogram on 03/20/21  Negative    03/20/2021 Initial Biopsy   Diagnosis Breast, right, needle core biopsy, lower  outer, 2.7cmfn, anterior depth - MAMMARY CARCINOMA IN SITU WITH CALCIFICATIONS AND NECROSIS. - LOBULAR NEOPLASIA (ATYPICAL LOBULAR HYPERPLASIA). Microscopic  Comment The carcinoma appears high grade. E-cadherin is pending and will be reported in an addendum. Dr. Saralyn Pilar has reviewed the case. The case was called to Dr. Luan Pulling on 03/21/2021.  E-cadherin is positive in the areas of in situ carcinoma and thus, these areas are consistent with high grade ductal carcinoma in situ. E-cadherin is negative in areas of atypical lobular hyperplasia.   04/16/2021 Cancer Staging   Staging form: Breast, AJCC 8th Edition - Pathologic stage from 04/16/2021: Stage Unknown (pT1a, pNX, G1, ER+, PR-, HER2+) - Signed by Truitt Merle, MD on 05/06/2021 Stage prefix: Initial diagnosis Multigene prognostic tests performed: None Histologic grading system: 3 grade system Residual tumor (R): R0 - None   04/16/2021 Surgery   RIGHT BREAST LUMPECTOMY WITH RADIOACTIVE SEED LOCALIZATION by Dr Donne Hazel    FINAL MICROSCOPIC DIAGNOSIS:   A. 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   All controls stained appropriately.    05/06/2021 Initial Diagnosis   Malignant neoplasm of lower-outer quadrant of right breast of female, estrogen receptor positive ([place])   05/24/2021 - 06/28/2021 Radiation Therapy   Whole breast radiation by Dr. Noreene Filbert    07/2021 -  Anti-estrogen oral therapy   Adjuvant Anastrozole   10/30/2021 Survivorship    SCP delivered by Cira Rue, NP     INTERVAL HISTORY: Pt is here for a routine scheduled virtual follow up.  I verified her identity.  She was last seen in the office 3 months ago.  Anastrozole has been held since last visit, she noticed that her left knee pain is overall stable, may be slightly improved.  Denies any other new symptoms.  She feels well overall, she is little concerned about her overall weight.   REVIEW OF SYSTEMS:   Constitutional: Denies fevers, chills or abnormal weight loss Eyes: Denies blurriness of vision Ears, nose, mouth, throat, and face: Denies mucositis or sore throat Respiratory: Denies cough, dyspnea or wheezes Cardiovascular: Denies palpitation, chest discomfort or lower extremity swelling Gastrointestinal:  Denies nausea, heartburn or change in bowel habits Skin: Denies abnormal skin rashes Lymphatics: Denies new lymphadenopathy or easy bruising Neurological:Denies numbness, tingling or new weaknesses Behavioral/Psych: Mood is stable, no new changes  All other systems were reviewed with the patient and are negative.  MEDICAL HISTORY:  Past Medical History:  Diagnosis Date   Arthritis    shoulders and lower back per patient   Headache    Heart murmur    Hypertension    Thyroid disease     SURGICAL HISTORY: Past Surgical History:  Procedure Laterality Date   BREAST EXCISIONAL BIOPSY Left  BREAST LUMPECTOMY WITH RADIOACTIVE SEED LOCALIZATION Right 04/16/2021   Procedure: RIGHT BREAST LUMPECTOMY WITH RADIOACTIVE SEED LOCALIZATION;  Surgeon: Rolm Bookbinder, MD;  Location: [facility];  Service: General;  Laterality: Right;   CARPAL TUNNEL RELEASE Right    PILONIDAL CYST EXCISION     THYROID CYST EXCISION Right    TONSILLECTOMY     removed as a child    I have reviewed the social history and family history with the patient and they are unchanged from previous note.  ALLERGIES:  is allergic to alendronate sodium, felodipine, and vitamin d  (calciferol).  MEDICATIONS:  Current Outpatient Medications  Medication Sig Dispense Refill   acetaminophen (TYLENOL) 325 MG tablet Take 650 mg by mouth every 6 (six) hours as needed for moderate pain.     anastrozole (ARIMIDEX) 1 MG tablet TAKE 1 TABLET(1 MG) BY MOUTH DAILY 90 tablet 2   atorvastatin (LIPITOR) 20 MG tablet Take 1 tablet (20 mg total) by mouth daily. 90 tablet 3   Cholecalciferol 100 MCG (4000 UT) CAPS Take 1 capsule by mouth daily.     meloxicam (MOBIC) 7.5 MG tablet TAKE 1 TO 2 TABLETS(7.5 TO 15 MG) BY MOUTH DAILY 40 tablet 0   metoprolol succinate (TOPROL-XL) 25 MG 24 hr tablet Take 1 tablet (25 mg total) by mouth every evening. Take with or immediately following a meal. 90 tablet 3   Multiple Vitamins-Minerals (HAIR FORMULA EXTRA STRENGTH PO) Take 1 tablet by mouth daily.     Omega-3 1000 MG CAPS Take 1,000 mg by mouth daily.     OVER THE COUNTER MEDICATION Apply 1 application topically at bedtime as needed (pain). Magnesium Cream     Propylene Glycol (SYSTANE BALANCE) 0.6 % SOLN Place 1 drop into both eyes daily as needed (dry eyes).     telmisartan-hydrochlorothiazide (MICARDIS HCT) 80-12.5 MG tablet Take 1 tablet by mouth daily.     valACYclovir (VALTREX) 500 MG tablet TAKE 2 TABLETS(1000 MG) BY MOUTH TWICE DAILY (Patient taking differently: Take 500 mg by mouth 2 (two) times daily as needed (outbreaks).) 20 tablet 0   No current facility-administered medications for this visit.    PHYSICAL EXAMINATION: Not performed   LABORATORY DATA:  I have reviewed the data as listed    Latest Ref Rng & Units 08/08/2022    1:12 PM 12/28/2021   11:54 AM 06/21/2021   10:24 AM  CBC  WBC 4.0 - 10.5 K/uL 3.9  3.1  5.7   Hemoglobin 12.0 - 15.0 g/dL 14.3  13.6  14.2   Hematocrit 36.0 - 46.0 % 41.2  39.6  41.8   Platelets 150 - 400 K/uL 189  163  213         Latest Ref Rng & Units 08/08/2022    1:12 PM 12/28/2021   11:54 AM 04/10/2021   12:13 PM  CMP  Glucose 70 - 99 mg/dL  97  103  98   BUN 8 - 23 mg/dL _0 Creatinine 0.44 - 1.00 mg/dL 0.65  0.67  0.64   Sodium 135 - 145 mmol/L 140  141  138   Potassium 3.5 - 5.1 mmol/L 4.1  4.3  3.7   Chloride 98 - 111 mmol/L 101  104  102   CO2 22 - 32 mmol/L 31  32  28   Calcium 8.9 - 10.3 mg/dL 10.4  10.0  9.6   Total Protein 6.5 - 8.1 g/dL 7.5  6.9  Total Bilirubin 0.3 - 1.2 mg/dL 1.2  1.0    Alkaline Phos 38 - 126 U/L 88  77    AST 15 - 41 U/L 21  20    ALT 0 - 44 U/L 19  14        RADIOGRAPHIC STUDIES: I have personally reviewed the radiological images as listed and agreed with the findings in the report. No results found.     I discussed the assessment and treatment plan with the patient. The patient was provided an opportunity to ask questions and all were answered. The patient agreed with the plan and demonstrated an understanding of the instructions.   The patient was advised to call back or seek an in-person evaluation if the symptoms worsen or if the condition fails to improve as anticipated.  I provided 15 minutes of non face-to-face telephone visit time during this encounter, and > 50% was spent counseling as documented under my assessment & plan.     Truitt Merle, MD 11/12/22

## 2022-11-12 NOTE — Telephone Encounter (Signed)
Left patient a voicemail regarding appointment change  

## 2022-11-14 ENCOUNTER — Telehealth: Payer: No Typology Code available for payment source | Admitting: Hematology

## 2022-12-19 DIAGNOSIS — E785 Hyperlipidemia, unspecified: Secondary | ICD-10-CM | POA: Diagnosis not present

## 2022-12-19 DIAGNOSIS — E559 Vitamin D deficiency, unspecified: Secondary | ICD-10-CM | POA: Diagnosis not present

## 2022-12-19 DIAGNOSIS — E041 Nontoxic single thyroid nodule: Secondary | ICD-10-CM | POA: Diagnosis not present

## 2022-12-19 DIAGNOSIS — R300 Dysuria: Secondary | ICD-10-CM | POA: Diagnosis not present

## 2022-12-19 DIAGNOSIS — U071 COVID-19: Secondary | ICD-10-CM | POA: Diagnosis not present

## 2022-12-26 ENCOUNTER — Other Ambulatory Visit: Payer: Self-pay | Admitting: Cardiology

## 2022-12-26 DIAGNOSIS — E559 Vitamin D deficiency, unspecified: Secondary | ICD-10-CM | POA: Diagnosis not present

## 2022-12-26 DIAGNOSIS — E785 Hyperlipidemia, unspecified: Secondary | ICD-10-CM | POA: Diagnosis not present

## 2022-12-26 DIAGNOSIS — I10 Essential (primary) hypertension: Secondary | ICD-10-CM | POA: Diagnosis not present

## 2023-01-14 ENCOUNTER — Telehealth: Payer: Self-pay | Admitting: Internal Medicine

## 2023-01-14 NOTE — Telephone Encounter (Signed)
Reached out to patient to reschedule , provider going to be out of the office. Left voicemail for patient to call back.

## 2023-02-06 ENCOUNTER — Ambulatory Visit: Payer: No Typology Code available for payment source | Admitting: Hematology

## 2023-02-06 ENCOUNTER — Other Ambulatory Visit: Payer: No Typology Code available for payment source

## 2023-02-11 ENCOUNTER — Other Ambulatory Visit: Payer: Self-pay

## 2023-02-11 DIAGNOSIS — Z780 Asymptomatic menopausal state: Secondary | ICD-10-CM | POA: Insufficient documentation

## 2023-02-11 DIAGNOSIS — C50511 Malignant neoplasm of lower-outer quadrant of right female breast: Secondary | ICD-10-CM

## 2023-02-11 NOTE — Assessment & Plan Note (Signed)
-  DEXA on 05/08/21 showed osteopenia (-2.1 in L1-4) -she reports she has tried alendronate but experienced increasing bone pain. She endorses taking vit D. -she previously declined any kind of bone medication

## 2023-02-11 NOTE — Progress Notes (Unsigned)
[place]   [phone] Fax:(336) [phone]   Clinic Follow up Note   Patient Care Team: Deland Pretty, MD as PCP - General (Internal Medicine) Valinda Party, MD (Rheumatology) Rockwell Germany, RN as Oncology Nurse Navigator Mauro Kaufmann, RN as Oncology Nurse Navigator Rolm Bookbinder, MD as Consulting Physician (General Surgery) Alla Feeling, NP as Nurse Practitioner (Nurse Practitioner) Truitt Merle, MD as Consulting Physician (Hematology)  Date of Service:  02/12/2023  CHIEF COMPLAINT: f/u of  right breast cancer   CURRENT THERAPY:  anastrozole  started 11/2022   ASSESSMENT:  Tracy Horne is a 77 y.o. female with   Malignant neoplasm of lower-outer quadrant of right breast of female, estrogen receptor positive ([place]) Stage I, p(T1aN0M0), ER+/PR-/HER2+, Grade I  -diagnosed with ALH in right breast on 08/16/20 and high grade DCIS and ALH in 2022.  -s/p right lumpectomy on 04/16/21 with Dr Donne Hazel, pathology showed a 0.15cm grade I invasive ductal carcinoma and components of high grade DCIS. -s/p radiation therapy 05/28/21 - 06/28/21 -she started anastrozole in 07/2021, held in 07/2022 due to her left knee pain. She restarted anastrozole in 11/2022 since her knee pain did not improved with holding anastrozole, she previously declined tamoxifen  -She has been tolerating anastrozole well since she restarted a months ago, will continue.  Osteopenia after menopause -DEXA on 05/08/21 showed osteopenia (-2.1 in L1-4) -she reports she has tried alendronate but experienced increasing bone pain. She endorses taking vit D. -she previously declined any kind of bone medication      PLAN: -Pt is tolerating Anastrozole well, will continue - I refill Anastrozole -Encourage the pt to increase activity by exercising. -schedule mammo at [facility] and bone density scan with PCP -f/u in 6 months   SUMMARY OF ONCOLOGIC HISTORY: Oncology History Overview Note   Cancer Staging Malignant neoplasm of lower-outer quadrant of right breast of female, estrogen receptor positive ([place]) Staging form: Breast, AJCC 8th Edition - Pathologic stage from 04/16/2021: Stage Unknown (pT1a, pNX, G1, ER+, PR-, HER2+) - Signed by Truitt Merle, MD on 05/06/2021 Stage prefix: Initial diagnosis Multigene prognostic tests performed: None Histologic grading system: 3 grade system Residual tumor (R): R0 - None    Malignant neoplasm of lower-outer quadrant of right breast of female, estrogen receptor positive  08/16/2020 Pathology Results   Diagnosis Breast, right, needle core biopsy - LOBULAR NEOPLASIA (ATYPICAL LOBULAR HYPERPLASIA) WITH MICROCALCIFICATIONS - SEE COMMENT Microscopic Comment These results were called to [facility] on August 17, 2020.   03/13/2021 Mammogram   Right mammogram 03/13/21 There are pleomorphic calcifications spanning 1.6cm in the slightly linear configuration within the right lower outer breast anterior to middle depth 2.7cm from the nipple. These are increased in number. No other significant masses or calcifications.    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 - LOBULAR NEOPLASIA (ATYPICAL LOBULAR HYPERPLASIA). Microscopic Comment The carcinoma appears high grade. E-cadherin is pending and will be reported in an addendum. Dr. Saralyn Pilar has reviewed the case. The case was called to Dr. Luan Pulling on 03/21/2021.  E-cadherin is positive in the areas of in situ carcinoma and thus, these areas are consistent with high grade ductal carcinoma in situ. E-cadherin is negative in areas of atypical lobular hyperplasia.   04/16/2021 Cancer Staging   Staging form: Breast, AJCC 8th Edition - Pathologic  stage from 04/16/2021: Stage Unknown (pT1a, pNX, G1, ER+, PR-, HER2+) - Signed by Truitt Merle,  MD on 05/06/2021 Stage prefix: Initial diagnosis Multigene prognostic tests performed: None Histologic grading system: 3 grade system Residual tumor (R): R0 - None   04/16/2021 Surgery   RIGHT BREAST LUMPECTOMY WITH RADIOACTIVE SEED LOCALIZATION by Dr Donne Hazel    FINAL MICROSCOPIC DIAGNOSIS:   A. 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   All controls stained appropriately.    05/06/2021 Initial Diagnosis   Malignant neoplasm of lower-outer quadrant of right breast of female, estrogen receptor positive ([place])   05/24/2021 - 06/28/2021 Radiation Therapy   Whole breast radiation by Dr. Noreene Filbert    07/2021 -  Anti-estrogen oral therapy   Adjuvant Anastrozole   10/30/2021 Survivorship   SCP delivered by Cira Rue, NP      INTERVAL HISTORY:  Tracy Horne is here for a follow up of  right breast cancer  She was last seen by me on 11/12/2022 She presents to the clinic alone. Pt state that she has gain some weight. Pt state that she is doing well in the antiestrogen therapy. Pt state that her knee pain has resolve not as bad as it was.    All other systems were reviewed with the patient and are negative.  MEDICAL HISTORY:  Past Medical History:  Diagnosis Date   Arthritis    shoulders and lower back per patient   Headache     Heart murmur    Hypertension    Thyroid disease     SURGICAL HISTORY: Past Surgical History:  Procedure Laterality Date   BREAST EXCISIONAL BIOPSY Left    BREAST LUMPECTOMY WITH RADIOACTIVE SEED LOCALIZATION Right 04/16/2021   Procedure: RIGHT BREAST LUMPECTOMY WITH RADIOACTIVE SEED LOCALIZATION;  Surgeon: Rolm Bookbinder, MD;  Location: [facility] [place];  Service: General;  Laterality: Right;   CARPAL TUNNEL RELEASE Right    PILONIDAL CYST EXCISION     THYROID CYST EXCISION Right    TONSILLECTOMY     removed as a child    I have reviewed the social history and family history with the patient and they are unchanged from previous note.  ALLERGIES:  is allergic to alendronate sodium, felodipine, and vitamin d (calciferol).  MEDICATIONS:  Current Outpatient Medications  Medication Sig Dispense Refill   acetaminophen (TYLENOL) 325 MG tablet Take 650 mg by mouth every 6 (six) hours as needed for moderate pain.     anastrozole (ARIMIDEX) 1 MG tablet TAKE 1 TABLET(1 MG) BY MOUTH DAILY 90 tablet 3   atorvastatin (LIPITOR) 20 MG tablet Take 1 tablet (20 mg total) by mouth daily. 90 tablet 3   Cholecalciferol 100 MCG (4000 UT) CAPS Take 1 capsule by  mouth daily.     meloxicam (MOBIC) 7.5 MG tablet TAKE 1 TO 2 TABLETS(7.5 TO 15 MG) BY MOUTH DAILY 40 tablet 0   metoprolol succinate (TOPROL-XL) 25 MG 24 hr tablet TAKE 1 TABLET(25 MG) BY MOUTH EVERY EVENING WITH OR IMMEDIATELY FOLLOWING A MEAL 30 tablet 0   Multiple Vitamins-Minerals (HAIR FORMULA EXTRA STRENGTH PO) Take 1 tablet by mouth daily.     Omega-3 1000 MG CAPS Take 1,000 mg by mouth daily.     OVER THE COUNTER MEDICATION Apply 1 application topically at bedtime as needed (pain). Magnesium Cream     Propylene Glycol (SYSTANE BALANCE) 0.6 % SOLN Place 1 drop into both eyes daily as needed (dry eyes).     telmisartan-hydrochlorothiazide (MICARDIS HCT) 80-12.5 MG tablet Take 1 tablet by mouth daily.     valACYclovir (VALTREX) 500 MG tablet TAKE 2  TABLETS(1000 MG) BY MOUTH TWICE DAILY (Patient taking differently: Take 500 mg by mouth 2 (two) times daily as needed (outbreaks).) 20 tablet 0   No current facility-administered medications for this visit.    PHYSICAL EXAMINATION: ECOG PERFORMANCE STATUS: 1 - Symptomatic but completely ambulatory  Vitals:   02/12/23 1027  BP: (!) 142/86  Pulse: 72  Resp: 15  Temp: 98.5 F (36.9 C)  SpO2: 99%   Wt Readings from Last 3 Encounters:  02/12/23 153 lb (69.4 kg)  08/09/22 146 lb 12.8 oz (66.6 kg)  08/08/22 145 lb 1.6 oz (65.8 kg)     HEART: regular rate & rhythm and no murmurs and (+)lower left extremity edema ABDOMEN:(-) abdomen soft, (-)non-tender and (-) normal bowel sounds BREAST:Rt breast lumpectomy tender to the touch, lt breast no palpable mass breast exam benign.    LABORATORY DATA:  I have reviewed the data as listed    Latest Ref Rng & Units 02/12/2023   10:09 AM 08/08/2022    1:12 PM 12/28/2021   11:54 AM  CBC  WBC 4.0 - 10.5 K/uL 3.0  3.9  3.1   Hemoglobin 12.0 - 15.0 g/dL 13.1  14.3  13.6   Hematocrit 36.0 - 46.0 % 38.2  41.2  39.6   Platelets 150 - 400 K/uL 168  189  163         Latest Ref Rng & Units 02/12/2023   10:09 AM 08/08/2022    1:12 PM 12/28/2021   11:54 AM  CMP  Glucose 70 - 99 mg/dL 129  97  103   BUN 8 - 23 mg/dL 19  18  16    Creatinine 0.44 - 1.00 mg/dL 0.70  0.65  0.67   Sodium 135 - 145 mmol/L 138  140  141   Potassium 3.5 - 5.1 mmol/L 3.6  4.1  4.3   Chloride 98 - 111 mmol/L 104  101  104   CO2 22 - 32 mmol/L 29  31  32   Calcium 8.9 - 10.3 mg/dL 9.7  10.4  10.0   Total Protein 6.5 - 8.1 g/dL 6.6  7.5  6.9   Total Bilirubin 0.3 - 1.2 mg/dL 0.9  1.2  1.0   Alkaline Phos 38 - 126 U/L 78  88  77   AST 15 - 41 U/L 21  21  20    ALT 0 - 44 U/L 22  19  14        RADIOGRAPHIC STUDIES: I have personally reviewed the radiological images as listed and agreed with the findings in the report. No results found.    No  orders of the defined types  were placed in this encounter.  All questions were answered. The patient knows to call the clinic with any problems, questions or concerns. No barriers to learning was detected. The total time spent in the appointment was 20 minutes.     Truitt Merle, MD 02/12/2023   Felicity Coyer, CMA, am acting as scribe for Truitt Merle, MD.   I have reviewed the above documentation for accuracy and completeness, and I agree with the above.

## 2023-02-11 NOTE — Assessment & Plan Note (Signed)
Stage I, p(T1aN0M0), ER+/PR-/HER2+, Grade I  -diagnosed with ALH in right breast on 08/16/20 and high grade DCIS and ALH in 2022.  -s/p right lumpectomy on 04/16/21 with Dr Donne Hazel, pathology showed a 0.15cm grade I invasive ductal carcinoma and components of high grade DCIS. -s/p radiation therapy 05/28/21 - 06/28/21 -she started anastrozole in 07/2021, held in 07/2022 due to her left knee pain. She restarted anastrozole in 11/2022 since her knee pain did not improved with holding anastrozole, she previously declined tamoxifen

## 2023-02-12 ENCOUNTER — Encounter: Payer: Self-pay | Admitting: Hematology

## 2023-02-12 ENCOUNTER — Inpatient Hospital Stay (HOSPITAL_BASED_OUTPATIENT_CLINIC_OR_DEPARTMENT_OTHER): Payer: Medicare Other | Admitting: Hematology

## 2023-02-12 ENCOUNTER — Inpatient Hospital Stay: Payer: Medicare Other | Attending: Hematology

## 2023-02-12 ENCOUNTER — Other Ambulatory Visit: Payer: Self-pay

## 2023-02-12 VITALS — BP 142/86 | HR 72 | Temp 98.5°F | Resp 15 | Wt 153.0 lb

## 2023-02-12 DIAGNOSIS — Z780 Asymptomatic menopausal state: Secondary | ICD-10-CM | POA: Diagnosis not present

## 2023-02-12 DIAGNOSIS — C50511 Malignant neoplasm of lower-outer quadrant of right female breast: Secondary | ICD-10-CM | POA: Insufficient documentation

## 2023-02-12 DIAGNOSIS — Z79899 Other long term (current) drug therapy: Secondary | ICD-10-CM | POA: Diagnosis not present

## 2023-02-12 DIAGNOSIS — Z170 Estrogen receptor positive status [ER+]: Secondary | ICD-10-CM | POA: Insufficient documentation

## 2023-02-12 DIAGNOSIS — Z923 Personal history of irradiation: Secondary | ICD-10-CM | POA: Insufficient documentation

## 2023-02-12 DIAGNOSIS — I10 Essential (primary) hypertension: Secondary | ICD-10-CM | POA: Diagnosis not present

## 2023-02-12 DIAGNOSIS — E079 Disorder of thyroid, unspecified: Secondary | ICD-10-CM | POA: Diagnosis not present

## 2023-02-12 DIAGNOSIS — Z79811 Long term (current) use of aromatase inhibitors: Secondary | ICD-10-CM | POA: Diagnosis not present

## 2023-02-12 DIAGNOSIS — M8580 Other specified disorders of bone density and structure, unspecified site: Secondary | ICD-10-CM | POA: Diagnosis not present

## 2023-02-12 DIAGNOSIS — Z79624 Long term (current) use of inhibitors of nucleotide synthesis: Secondary | ICD-10-CM | POA: Insufficient documentation

## 2023-02-12 DIAGNOSIS — M8588 Other specified disorders of bone density and structure, other site: Secondary | ICD-10-CM | POA: Insufficient documentation

## 2023-02-12 LAB — CMP (CANCER CENTER ONLY)
ALT: 22 U/L (ref 0–44)
AST: 21 U/L (ref 15–41)
Albumin: 4.1 g/dL (ref 3.5–5.0)
Alkaline Phosphatase: 78 U/L (ref 38–126)
Anion gap: 5 (ref 5–15)
BUN: 19 mg/dL (ref 8–23)
CO2: 29 mmol/L (ref 22–32)
Calcium: 9.7 mg/dL (ref 8.9–10.3)
Chloride: 104 mmol/L (ref 98–111)
Creatinine: 0.7 mg/dL (ref 0.44–1.00)
GFR, Estimated: >60 mL/min (ref >60)
Glucose, Bld: 129 mg/dL — ABNORMAL HIGH (ref 70–99)
Potassium: 3.6 mmol/L (ref 3.5–5.1)
Sodium: 138 mmol/L (ref 135–145)
Total Bilirubin: 0.9 mg/dL (ref 0.3–1.2)
Total Protein: 6.6 g/dL (ref 6.5–8.1)

## 2023-02-12 LAB — CBC WITH DIFFERENTIAL (CANCER CENTER ONLY)
Abs Immature Granulocytes: 0.01 10*3/uL (ref 0.00–0.07)
Basophils Absolute: 0 10*3/uL (ref 0.0–0.1)
Basophils Relative: 1 %
Eosinophils Absolute: 0.1 10*3/uL (ref 0.0–0.5)
Eosinophils Relative: 2 %
HCT: 38.2 % (ref 36.0–46.0)
Hemoglobin: 13.1 g/dL (ref 12.0–15.0)
Immature Granulocytes: 0 %
Lymphocytes Relative: 29 %
Lymphs Abs: 0.9 10*3/uL (ref 0.7–4.0)
MCH: 31 pg (ref 26.0–34.0)
MCHC: 34.3 g/dL (ref 30.0–36.0)
MCV: 90.3 fL (ref 80.0–100.0)
Monocytes Absolute: 0.3 10*3/uL (ref 0.1–1.0)
Monocytes Relative: 10 %
Neutro Abs: 1.8 10*3/uL (ref 1.7–7.7)
Neutrophils Relative %: 58 %
Platelet Count: 168 10*3/uL (ref 150–400)
RBC: 4.23 MIL/uL (ref 3.87–5.11)
RDW: 12.6 % (ref 11.5–15.5)
WBC Count: 3 10*3/uL — ABNORMAL LOW (ref 4.0–10.5)
nRBC: 0 % (ref 0.0–0.2)

## 2023-02-12 MED ORDER — ANASTROZOLE 1 MG PO TABS: ORAL_TABLET | ORAL | 3 refills | Status: DC

## 2023-02-27 DIAGNOSIS — M65351 Trigger finger, right little finger: Secondary | ICD-10-CM | POA: Diagnosis not present

## 2023-02-27 DIAGNOSIS — M65341 Trigger finger, right ring finger: Secondary | ICD-10-CM | POA: Diagnosis not present

## 2023-02-27 DIAGNOSIS — M65331 Trigger finger, right middle finger: Secondary | ICD-10-CM | POA: Diagnosis not present

## 2023-03-27 DIAGNOSIS — M65351 Trigger finger, right little finger: Secondary | ICD-10-CM | POA: Diagnosis not present

## 2023-03-27 DIAGNOSIS — R2232 Localized swelling, mass and lump, left upper limb: Secondary | ICD-10-CM | POA: Diagnosis not present

## 2023-03-27 DIAGNOSIS — M65331 Trigger finger, right middle finger: Secondary | ICD-10-CM | POA: Diagnosis not present

## 2023-03-27 DIAGNOSIS — M65341 Trigger finger, right ring finger: Secondary | ICD-10-CM | POA: Diagnosis not present

## 2023-05-02 DIAGNOSIS — Z853 Personal history of malignant neoplasm of breast: Secondary | ICD-10-CM | POA: Diagnosis not present

## 2023-05-02 DIAGNOSIS — N644 Mastodynia: Secondary | ICD-10-CM | POA: Diagnosis not present

## 2023-05-06 DIAGNOSIS — H5711 Ocular pain, right eye: Secondary | ICD-10-CM | POA: Diagnosis not present

## 2023-05-06 DIAGNOSIS — H1045 Other chronic allergic conjunctivitis: Secondary | ICD-10-CM | POA: Diagnosis not present

## 2023-06-26 ENCOUNTER — Other Ambulatory Visit: Payer: Self-pay

## 2023-06-26 ENCOUNTER — Emergency Department (HOSPITAL_BASED_OUTPATIENT_CLINIC_OR_DEPARTMENT_OTHER): Payer: Medicare Other

## 2023-06-26 ENCOUNTER — Encounter (HOSPITAL_BASED_OUTPATIENT_CLINIC_OR_DEPARTMENT_OTHER): Payer: Self-pay | Admitting: Emergency Medicine

## 2023-06-26 ENCOUNTER — Emergency Department (HOSPITAL_BASED_OUTPATIENT_CLINIC_OR_DEPARTMENT_OTHER)
Admission: EM | Admit: 2023-06-26 | Discharge: 2023-06-26 | Disposition: A | Discharge status: Home / Self Care | Payer: Medicare Other | Attending: Emergency Medicine | Admitting: Emergency Medicine

## 2023-06-26 DIAGNOSIS — M25571 Pain in right ankle and joints of right foot: Secondary | ICD-10-CM | POA: Insufficient documentation

## 2023-06-26 DIAGNOSIS — M25561 Pain in right knee: Secondary | ICD-10-CM | POA: Insufficient documentation

## 2023-06-26 DIAGNOSIS — W1839XA Other fall on same level, initial encounter: Secondary | ICD-10-CM | POA: Diagnosis not present

## 2023-06-26 DIAGNOSIS — Y92520 Airport as the place of occurrence of the external cause: Secondary | ICD-10-CM | POA: Insufficient documentation

## 2023-06-26 DIAGNOSIS — M25511 Pain in right shoulder: Secondary | ICD-10-CM | POA: Diagnosis not present

## 2023-06-26 DIAGNOSIS — W19XXXA Unspecified fall, initial encounter: Secondary | ICD-10-CM

## 2023-06-26 DIAGNOSIS — S0993XA Unspecified injury of face, initial encounter: Secondary | ICD-10-CM | POA: Diagnosis not present

## 2023-06-26 DIAGNOSIS — S199XXA Unspecified injury of neck, initial encounter: Secondary | ICD-10-CM | POA: Diagnosis not present

## 2023-06-26 DIAGNOSIS — S0083XA Contusion of other part of head, initial encounter: Secondary | ICD-10-CM | POA: Diagnosis not present

## 2023-06-26 DIAGNOSIS — I10 Essential (primary) hypertension: Secondary | ICD-10-CM | POA: Diagnosis not present

## 2023-06-26 DIAGNOSIS — S0990XA Unspecified injury of head, initial encounter: Secondary | ICD-10-CM | POA: Diagnosis not present

## 2023-06-26 NOTE — ED Notes (Signed)
Report given to the next RN... 

## 2023-06-26 NOTE — Discharge Instructions (Addendum)
The x-rays fortunately do not show any signs of serious injury.  Take over the counter medication as needed for aches and pains.  Follow-up with your dentist to have your dental injury evaluated.

## 2023-06-26 NOTE — ED Triage Notes (Signed)
Fall on face/chin Headache [place] balance at airport Last night around 8pm

## 2023-06-26 NOTE — ED Notes (Signed)
Pt pulse ox not picking up well, placed on 5 lead heart monitor.

## 2023-06-26 NOTE — ED Provider Notes (Signed)
[place] EMERGENCY DEPARTMENT AT [facility] Provider Note   CSN: [id number] Arrival date & time: 06/26/23  1728     History  Chief Complaint  Patient presents with   Tracy Horne    Tracy Horne is a 77 y.o. female.   Fall     Patient has history of hypertension arthritis, thyroid disease.  Patient presents ED for evaluation after a fall.  Patient was traveling yesterday.  She was at the airport when she fell forward landing on her face.  Patient states she chipped her front teeth.  Patient was not evaluated a medical facility because she needed to catch her flight.  Patient states she is having trouble with headaches and pain in her jaw.  Patient is also having some pain in her right shoulder right knee and right ankle.  She has been able to ambulate.  She has not had any vomiting.  No numbness or weakness.  Home Medications Prior to Admission medications   Medication Sig Start Date End Date Taking? Authorizing Provider  acetaminophen (TYLENOL) 325 MG tablet Take 650 mg by mouth every 6 (six) hours as needed for moderate pain.    [provider]  anastrozole (ARIMIDEX) 1 MG tablet TAKE 1 TABLET(1 MG) BY MOUTH DAILY 02/12/23   Malachy Mood, MD  atorvastatin (LIPITOR) 20 MG tablet Take 1 tablet (20 mg total) by mouth daily. 08/09/22 08/04/23  Yates Decamp, MD  Cholecalciferol 100 MCG (4000 UT) CAPS Take 1 capsule by mouth daily.    [provider]  meloxicam (MOBIC) 7.5 MG tablet TAKE 1 TO 2 TABLETS(7.5 TO 15 MG) BY MOUTH DAILY 08/30/22   Malachy Mood, MD  metoprolol succinate (TOPROL-XL) 25 MG 24 hr tablet TAKE 1 TABLET(25 MG) BY MOUTH EVERY EVENING WITH OR IMMEDIATELY FOLLOWING A MEAL 12/27/22   Yates Decamp, MD  Multiple Vitamins-Minerals (HAIR FORMULA EXTRA STRENGTH PO) Take 1 tablet by mouth daily.    [provider]  Omega-3 1000 MG CAPS Take 1,000 mg by mouth daily.    [provider]  OVER THE COUNTER MEDICATION Apply 1 application topically at  bedtime as needed (pain). Magnesium Cream    [provider]  Propylene Glycol (SYSTANE BALANCE) 0.6 % SOLN Place 1 drop into both eyes daily as needed (dry eyes).    [provider]  telmisartan-hydrochlorothiazide (MICARDIS HCT) 80-12.5 MG tablet Take 1 tablet by mouth daily.    [provider]  valACYclovir (VALTREX) 500 MG tablet TAKE 2 TABLETS(1000 MG) BY MOUTH TWICE DAILY Patient taking differently: Take 500 mg by mouth 2 (two) times daily as needed (outbreaks). 08/15/17   Levert Feinstein, MD      Allergies    Alendronate sodium, Felodipine, and Vitamin d (calciferol)    Review of Systems   Review of Systems  Physical Exam Updated Vital Signs BP 132/71 (BP Location: Right Arm)   Pulse 74   Temp 98.2 F (36.8 C)   Resp 16   SpO2 99%  Physical Exam Vitals and nursing note reviewed.  Constitutional:      Appearance: She is well-developed. She is not diaphoretic.  HENT:     Head: Normocephalic.     Comments: Type 1 dental fracture noted upper central incisors , facial ttp maxillary and mandible, small ecchymoses lip, no laceration    Right Ear: External ear normal.     Left Ear: External ear normal.  Eyes:     General: No scleral icterus.  Right eye: No discharge.        Left eye: No discharge.     Conjunctiva/sclera: Conjunctivae normal.  Neck:     Trachea: No tracheal deviation.  Cardiovascular:     Rate and Rhythm: Normal rate and regular rhythm.  Pulmonary:     Effort: Pulmonary effort is normal. No respiratory distress.     Breath sounds: Normal breath sounds. No stridor. No wheezing or rales.  Abdominal:     General: Bowel sounds are normal. There is no distension.     Palpations: Abdomen is soft.     Tenderness: There is no abdominal tenderness. There is no guarding or rebound.  Musculoskeletal:        General: No deformity.     Right shoulder: Tenderness present. Normal range of motion.     Left shoulder: Normal.     Right forearm:  Normal.     Left forearm: Normal.     Cervical back: Neck supple. No tenderness.     Thoracic back: No tenderness.     Lumbar back: No tenderness.     Right knee: Tenderness present.     Right ankle: No swelling. Tenderness present.  Skin:    General: Skin is warm and dry.     Findings: No rash.  Neurological:     General: No focal deficit present.     Mental Status: She is alert.     Cranial Nerves: No cranial nerve deficit, dysarthria or facial asymmetry.     Sensory: No sensory deficit.     Motor: No abnormal muscle tone or seizure activity.     Coordination: Coordination normal.  Psychiatric:        Mood and Affect: Mood normal.     ED Results / Procedures / Treatments   Labs (all labs ordered are listed, but only abnormal results are displayed) Labs Reviewed - No data to display  EKG None  Radiology CT Head Wo Contrast  Result Date: 06/26/2023 CLINICAL DATA:  Head trauma, minor (Age >= 65y); Neck trauma (Age >= 65y); Facial trauma, blunt l ost balance at airport Last night around 8pm EXAM: CT HEAD WITHOUT CONTRAST CT MAXILLOFACIAL WITHOUT CONTRAST CT CERVICAL SPINE WITHOUT CONTRAST TECHNIQUE: Multidetector CT imaging of the head, cervical spine, and maxillofacial structures were performed using the standard protocol without intravenous contrast. Multiplanar CT image reconstructions of the cervical spine and maxillofacial structures were also generated. RADIATION DOSE REDUCTION: This exam was performed according to the departmental dose-optimization program which includes automated exposure control, adjustment of the mA and/or kV according to patient size and/or use of iterative reconstruction technique. COMPARISON:  None Available. FINDINGS: CT HEAD FINDINGS Brain: No evidence of large-territorial acute infarction. No parenchymal hemorrhage. No mass lesion. No extra-axial collection. No mass effect or midline shift. No hydrocephalus. Basilar cisterns are patent. Vascular: No  hyperdense vessel. Skull: No acute fracture or focal lesion. Other: None. CT MAXILLOFACIAL FINDINGS Osseous: No fracture or mandibular dislocation. No destructive process. Sinuses/Orbits: Paranasal sinuses and mastoid air cells are clear. The orbits are unremarkable. Soft tissues: Negative. CT CERVICAL SPINE FINDINGS Alignment: Normal. Skull base and vertebrae: Multilevel moderate degenerative changes spine. No associated severe osseous neural foraminal or central canal stenosis. No acute fracture. No aggressive appearing focal osseous lesion or focal pathologic process. Soft tissues and spinal canal: No prevertebral fluid or swelling. No visible canal hematoma. Upper chest: Unremarkable. Other: None. IMPRESSION: 1. No acute intracranial abnormality. 2.  No acute displaced facial fracture. 3. No  acute displaced fracture or traumatic listhesis of the cervical spine. Electronically Signed   By: Tish Frederickson M.D.   On: 06/26/2023 19:49   CT Cervical Spine Wo Contrast  Result Date: 06/26/2023 CLINICAL DATA:  Head trauma, minor (Age >= 65y); Neck trauma (Age >= 65y); Facial trauma, blunt l ost balance at airport Last night around 8pm EXAM: CT HEAD WITHOUT CONTRAST CT MAXILLOFACIAL WITHOUT CONTRAST CT CERVICAL SPINE WITHOUT CONTRAST TECHNIQUE: Multidetector CT imaging of the head, cervical spine, and maxillofacial structures were performed using the standard protocol without intravenous contrast. Multiplanar CT image reconstructions of the cervical spine and maxillofacial structures were also generated. RADIATION DOSE REDUCTION: This exam was performed according to the departmental dose-optimization program which includes automated exposure control, adjustment of the mA and/or kV according to patient size and/or use of iterative reconstruction technique. COMPARISON:  None Available. FINDINGS: CT HEAD FINDINGS Brain: No evidence of large-territorial acute infarction. No parenchymal hemorrhage. No mass lesion. No  extra-axial collection. No mass effect or midline shift. No hydrocephalus. Basilar cisterns are patent. Vascular: No hyperdense vessel. Skull: No acute fracture or focal lesion. Other: None. CT MAXILLOFACIAL FINDINGS Osseous: No fracture or mandibular dislocation. No destructive process. Sinuses/Orbits: Paranasal sinuses and mastoid air cells are clear. The orbits are unremarkable. Soft tissues: Negative. CT CERVICAL SPINE FINDINGS Alignment: Normal. Skull base and vertebrae: Multilevel moderate degenerative changes spine. No associated severe osseous neural foraminal or central canal stenosis. No acute fracture. No aggressive appearing focal osseous lesion or focal pathologic process. Soft tissues and spinal canal: No prevertebral fluid or swelling. No visible canal hematoma. Upper chest: Unremarkable. Other: None. IMPRESSION: 1. No acute intracranial abnormality. 2.  No acute displaced facial fracture. 3. No acute displaced fracture or traumatic listhesis of the cervical spine. Electronically Signed   By: Tish Frederickson M.D.   On: 06/26/2023 19:49   CT Maxillofacial WO CM  Result Date: 06/26/2023 CLINICAL DATA:  Head trauma, minor (Age >= 65y); Neck trauma (Age >= 65y); Facial trauma, blunt l ost balance at airport Last night around 8pm EXAM: CT HEAD WITHOUT CONTRAST CT MAXILLOFACIAL WITHOUT CONTRAST CT CERVICAL SPINE WITHOUT CONTRAST TECHNIQUE: Multidetector CT imaging of the head, cervical spine, and maxillofacial structures were performed using the standard protocol without intravenous contrast. Multiplanar CT image reconstructions of the cervical spine and maxillofacial structures were also generated. RADIATION DOSE REDUCTION: This exam was performed according to the departmental dose-optimization program which includes automated exposure control, adjustment of the mA and/or kV according to patient size and/or use of iterative reconstruction technique. COMPARISON:  None Available. FINDINGS: CT HEAD  FINDINGS Brain: No evidence of large-territorial acute infarction. No parenchymal hemorrhage. No mass lesion. No extra-axial collection. No mass effect or midline shift. No hydrocephalus. Basilar cisterns are patent. Vascular: No hyperdense vessel. Skull: No acute fracture or focal lesion. Other: None. CT MAXILLOFACIAL FINDINGS Osseous: No fracture or mandibular dislocation. No destructive process. Sinuses/Orbits: Paranasal sinuses and mastoid air cells are clear. The orbits are unremarkable. Soft tissues: Negative. CT CERVICAL SPINE FINDINGS Alignment: Normal. Skull base and vertebrae: Multilevel moderate degenerative changes spine. No associated severe osseous neural foraminal or central canal stenosis. No acute fracture. No aggressive appearing focal osseous lesion or focal pathologic process. Soft tissues and spinal canal: No prevertebral fluid or swelling. No visible canal hematoma. Upper chest: Unremarkable. Other: None. IMPRESSION: 1. No acute intracranial abnormality. 2.  No acute displaced facial fracture. 3. No acute displaced fracture or traumatic listhesis of the cervical spine. Electronically Signed  By: Tish Frederickson M.D.   On: 06/26/2023 19:49   DG Shoulder Right  Result Date: 06/26/2023 CLINICAL DATA:  Fall last night. Headache with multiple extremity pain. EXAM: RIGHT SHOULDER - 2+ VIEW; RIGHT KNEE - COMPLETE 4+ VIEW; RIGHT ANKLE - COMPLETE 3+ VIEW COMPARISON:  None Available. FINDINGS: Right shoulder: The bones are demineralized. There is no evidence of acute fracture or dislocation. Mild glenohumeral and acromioclavicular degenerative changes are present with mild narrowing of the subacromial space. The soft tissues appear unremarkable. Right knee: The mineralization and alignment are normal. There is no evidence of acute fracture or dislocation. Minimal medial and patellofemoral joint space narrowing. No joint effusion or focal soft tissue abnormality. There is mild spurring at the  quadriceps insertion on the patella. Right ankle: The mineralization and alignment are normal. There is no evidence of acute fracture or dislocation. The joint spaces are preserved. Small bidirectional calcaneal spurs. Mild nonspecific generalized soft tissue edema. No evidence of foreign body. IMPRESSION: 1. No evidence of acute fracture or dislocation in the right shoulder, right knee or right ankle. 2. Mild degenerative changes as described with mild narrowing of the subacromial space of the right shoulder. Electronically Signed   By: Carey Bullocks M.D.   On: 06/26/2023 19:05   DG Ankle Complete Right  Result Date: 06/26/2023 CLINICAL DATA:  Fall last night. Headache with multiple extremity pain. EXAM: RIGHT SHOULDER - 2+ VIEW; RIGHT KNEE - COMPLETE 4+ VIEW; RIGHT ANKLE - COMPLETE 3+ VIEW COMPARISON:  None Available. FINDINGS: Right shoulder: The bones are demineralized. There is no evidence of acute fracture or dislocation. Mild glenohumeral and acromioclavicular degenerative changes are present with mild narrowing of the subacromial space. The soft tissues appear unremarkable. Right knee: The mineralization and alignment are normal. There is no evidence of acute fracture or dislocation. Minimal medial and patellofemoral joint space narrowing. No joint effusion or focal soft tissue abnormality. There is mild spurring at the quadriceps insertion on the patella. Right ankle: The mineralization and alignment are normal. There is no evidence of acute fracture or dislocation. The joint spaces are preserved. Small bidirectional calcaneal spurs. Mild nonspecific generalized soft tissue edema. No evidence of foreign body. IMPRESSION: 1. No evidence of acute fracture or dislocation in the right shoulder, right knee or right ankle. 2. Mild degenerative changes as described with mild narrowing of the subacromial space of the right shoulder. Electronically Signed   By: Carey Bullocks M.D.   On: 06/26/2023 19:05    DG Knee Complete 4 Views Right  Result Date: 06/26/2023 CLINICAL DATA:  Fall last night. Headache with multiple extremity pain. EXAM: RIGHT SHOULDER - 2+ VIEW; RIGHT KNEE - COMPLETE 4+ VIEW; RIGHT ANKLE - COMPLETE 3+ VIEW COMPARISON:  None Available. FINDINGS: Right shoulder: The bones are demineralized. There is no evidence of acute fracture or dislocation. Mild glenohumeral and acromioclavicular degenerative changes are present with mild narrowing of the subacromial space. The soft tissues appear unremarkable. Right knee: The mineralization and alignment are normal. There is no evidence of acute fracture or dislocation. Minimal medial and patellofemoral joint space narrowing. No joint effusion or focal soft tissue abnormality. There is mild spurring at the quadriceps insertion on the patella. Right ankle: The mineralization and alignment are normal. There is no evidence of acute fracture or dislocation. The joint spaces are preserved. Small bidirectional calcaneal spurs. Mild nonspecific generalized soft tissue edema. No evidence of foreign body. IMPRESSION: 1. No evidence of acute fracture or dislocation in the right  shoulder, right knee or right ankle. 2. Mild degenerative changes as described with mild narrowing of the subacromial space of the right shoulder. Electronically Signed   By: Carey Bullocks M.D.   On: 06/26/2023 19:05    Procedures Procedures    Medications Ordered in ED Medications - No data to display  ED Course/ Medical Decision Making/ A&P Clinical Course as of 06/26/23 2025  Thu Jun 26, 2023  2002 Head C-spine and maxillofacial CT without acute abnormality [JK]  2002 Right shoulder knee and ankle x-ray without signs of fracture or dislocation [JK]    Clinical Course User Index [JK] Linwood Dibbles, MD                                 Medical Decision Making Problems Addressed: Contusion of face, initial encounter: acute illness or injury  Amount and/or Complexity of Data  Reviewed Radiology: ordered.   Patient presented to the ED for evaluation after a fall.  Patient tripped and had a mechanical fall.  Injury occurred last evening.  Patient's ED workup fortunately does not show any signs of severe injury.  No severe head injury.  No facial bone fracture.  No cervical spine injury.  No evidence of knee ankle or shoulder dislocation or fracture.  Evaluation and diagnostic testing in the emergency department does not suggest an emergent condition requiring admission or immediate intervention beyond what has been performed at this time.  The patient is safe for discharge and has been instructed to return immediately for worsening symptoms, change in symptoms or any other concerns.        Final Clinical Impression(s) / ED Diagnoses Final diagnoses:  Contusion of face, initial encounter  Fall, initial encounter    Rx / DC Orders ED Discharge Orders     None         Linwood Dibbles, MD 06/26/23 2025

## 2023-06-26 NOTE — ED Notes (Signed)
Pt took her night meds... Pt had them are person... Provider aware and authorized.Marland KitchenMarland Kitchen

## 2023-07-01 DIAGNOSIS — I10 Essential (primary) hypertension: Secondary | ICD-10-CM | POA: Diagnosis not present

## 2023-07-01 DIAGNOSIS — E785 Hyperlipidemia, unspecified: Secondary | ICD-10-CM | POA: Diagnosis not present

## 2023-07-02 ENCOUNTER — Telehealth: Payer: Self-pay

## 2023-07-02 NOTE — Telephone Encounter (Signed)
Transition Care Management Unsuccessful Follow-up Telephone Call  Date of discharge and from where:  [facility] 8/15  Attempts:  1st Attempt  Reason for unsuccessful TCM follow-up call:  No answer/busy   Tracy Horne  [facility], [facility] Guide, Phone: [phone] Website: [email or web address]

## 2023-07-03 ENCOUNTER — Telehealth: Payer: Self-pay

## 2023-07-03 DIAGNOSIS — Z9181 History of falling: Secondary | ICD-10-CM | POA: Diagnosis not present

## 2023-07-03 DIAGNOSIS — S0083XD Contusion of other part of head, subsequent encounter: Secondary | ICD-10-CM | POA: Diagnosis not present

## 2023-07-03 DIAGNOSIS — K0381 Cracked tooth: Secondary | ICD-10-CM | POA: Diagnosis not present

## 2023-07-03 DIAGNOSIS — I10 Essential (primary) hypertension: Secondary | ICD-10-CM | POA: Diagnosis not present

## 2023-07-03 DIAGNOSIS — E559 Vitamin D deficiency, unspecified: Secondary | ICD-10-CM | POA: Diagnosis not present

## 2023-07-03 DIAGNOSIS — Z09 Encounter for follow-up examination after completed treatment for conditions other than malignant neoplasm: Secondary | ICD-10-CM | POA: Diagnosis not present

## 2023-07-03 DIAGNOSIS — E7800 Pure hypercholesterolemia, unspecified: Secondary | ICD-10-CM | POA: Diagnosis not present

## 2023-07-03 NOTE — Telephone Encounter (Signed)
Transition Care Management Unsuccessful Follow-up Telephone Call  Date of discharge and from where:  [facility] 8/15  Attempts:  2nd Attempt  Reason for unsuccessful TCM follow-up call:  No answer/busy   Tracy Horne  [facility], [facility] Guide, Phone: [phone] Website: [email or web address]

## 2023-08-07 ENCOUNTER — Ambulatory Visit: Payer: No Typology Code available for payment source | Admitting: Radiation Oncology

## 2023-08-12 NOTE — Progress Notes (Unsigned)
Patient Care Team: Fatima Sanger, FNP as PCP - General (Internal Medicine) Rossie Muskrat, MD (Rheumatology) Donnelly Angelica, RN as Oncology Nurse Navigator Pershing Proud, RN as Oncology Nurse Navigator Emelia Loron, MD as Consulting Physician (General Surgery) Pollyann Samples, NP as Nurse Practitioner (Nurse Practitioner) Malachy Mood, MD as Consulting Physician (Hematology)   CHIEF COMPLAINT: Follow-up right breast cancer  Oncology History Overview Note  Cancer Staging Malignant neoplasm of lower-outer quadrant of right breast of female, estrogen receptor positive (HCC) Staging form: Breast, AJCC 8th Edition - Pathologic stage from 04/16/2021: Stage Unknown (pT1a, pNX, G1, ER+, PR-, HER2+) - Signed by Malachy Mood, MD on 05/06/2021 Stage prefix: Initial diagnosis Multigene prognostic tests performed: None Histologic grading system: 3 grade system Residual tumor (R): R0 - None    Malignant neoplasm of lower-outer quadrant of right breast of female, estrogen receptor positive (HCC)  08/16/2020 Pathology Results   Diagnosis Breast, right, needle core biopsy - LOBULAR NEOPLASIA (ATYPICAL LOBULAR HYPERPLASIA) WITH MICROCALCIFICATIONS - SEE COMMENT Microscopic Comment These results were called to [facility] on August 17, 2020.   03/13/2021 Mammogram   Right mammogram 03/13/21 There are pleomorphic calcifications spanning 1.6cm in the slightly linear configuration within the right lower outer breast anterior to middle depth 2.7cm from the nipple. These are increased in number. No other significant masses or calcifications.    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 - LOBULAR NEOPLASIA (ATYPICAL LOBULAR HYPERPLASIA). Microscopic Comment The carcinoma appears high grade. E-cadherin is pending and  will be reported in an addendum. Dr. Luisa Hart has reviewed the case. The case was called to Dr. Juanetta Gosling on 03/21/2021.  E-cadherin is positive in the areas of in situ carcinoma and thus, these areas are consistent with high grade ductal carcinoma in situ. E-cadherin is negative in areas of atypical lobular hyperplasia.   04/16/2021 Cancer Staging   Staging form: Breast, AJCC 8th Edition - Pathologic stage from 04/16/2021: Stage Unknown (pT1a, pNX, G1, ER+, PR-, HER2+) - Signed by Malachy Mood, MD on 05/06/2021 Stage prefix: Initial diagnosis Multigene prognostic tests performed: None Histologic grading system: 3 grade system Residual tumor (R): R0 - None   04/16/2021 Surgery   RIGHT BREAST LUMPECTOMY WITH RADIOACTIVE SEED LOCALIZATION by Dr Dwain Sarna    FINAL MICROSCOPIC DIAGNOSIS:   A. 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   All controls stained appropriately.    05/06/2021 Initial Diagnosis   Malignant neoplasm of lower-outer quadrant of right breast of female, estrogen receptor positive (HCC)   05/24/2021 - 06/28/2021 Radiation Therapy  Whole breast radiation by Dr. Carmina Miller    07/2021 -  Anti-estrogen oral therapy   Adjuvant Anastrozole   10/30/2021 Survivorship   SCP delivered by Santiago Glad, NP      CURRENT THERAPY:  Anastrozole, starting 07/2021, held 9/23 for joint pain which did not improve with holding AI - restarted 01/2023    INTERVAL HISTORY Ms. Kopecky returns for follow-up as scheduled, last seen by Dr. Mosetta Putt 02/12/23.  [place], was not dizzy but broke some teeth and bruised her chin.  Imaging ruled out any fractures.  She has recovered.  Has occasional tenderness in the right lower outer breast, denies new lump/mass, nipple discharge or inversion, or skin change.  She continues anastrozole, frustrated with weight gain and hair loss but otherwise tolerating well.  ROS  All other systems reviewed and negative  Past Medical History:  Diagnosis Date   Arthritis    shoulders and lower back per patient   Headache    Heart murmur    Hypertension    Thyroid disease      Past Surgical History:  Procedure Laterality Date   BREAST EXCISIONAL BIOPSY Left    BREAST LUMPECTOMY WITH RADIOACTIVE SEED LOCALIZATION Right 04/16/2021   Procedure: RIGHT BREAST LUMPECTOMY WITH RADIOACTIVE SEED LOCALIZATION;  Surgeon: Emelia Loron, MD;  Location: [facility] OR;  Service: General;  Laterality: Right;   CARPAL TUNNEL RELEASE Right    PILONIDAL CYST EXCISION     THYROID CYST EXCISION Right    TONSILLECTOMY     removed as a child     Outpatient Encounter Medications as of 08/14/2023  Medication Sig   acetaminophen (TYLENOL) 325 MG tablet Take 650 mg by mouth every 6 (six) hours as needed for moderate pain.   meloxicam (MOBIC) 7.5 MG tablet TAKE 1 TO 2 TABLETS(7.5 TO 15 MG) BY MOUTH DAILY   metoprolol succinate (TOPROL-XL) 25 MG 24 hr tablet TAKE 1 TABLET(25 MG) BY MOUTH EVERY EVENING WITH OR IMMEDIATELY FOLLOWING A MEAL   Multiple Vitamins-Minerals (HAIR FORMULA EXTRA STRENGTH PO) Take 1 tablet by mouth daily.   Omega-3 1000 MG CAPS Take 1,000 mg by mouth daily.   OVER THE COUNTER MEDICATION Apply 1 application topically at bedtime as needed (pain). Magnesium Cream    telmisartan-hydrochlorothiazide (MICARDIS HCT) 80-12.5 MG tablet Take 1 tablet by mouth daily.   valACYclovir (VALTREX) 500 MG tablet TAKE 2 TABLETS(1000 MG) BY MOUTH TWICE DAILY (Patient taking differently: Take 500 mg by mouth 2 (two) times daily as needed (outbreaks).)   [DISCONTINUED] anastrozole (ARIMIDEX) 1 MG tablet TAKE 1 TABLET(1 MG) BY MOUTH DAILY   anastrozole (ARIMIDEX) 1 MG tablet TAKE 1 TABLET(1 MG) BY MOUTH DAILY   atorvastatin (LIPITOR) 20 MG tablet Take 1 tablet (20 mg total) by mouth daily.   Cholecalciferol 100 MCG (4000 UT) CAPS Take 1 capsule by mouth daily.   Propylene Glycol (SYSTANE BALANCE) 0.6 % SOLN Place 1 drop into both eyes daily as needed (dry eyes).   No facility-administered encounter medications on file as of 08/14/2023.     Today's Vitals   08/14/23 1054 08/14/23 1100  BP: (!) 142/92   Pulse: 70   Resp: 18   Temp: 98.2 F (36.8 C)   TempSrc: Oral   SpO2: 100%   Weight: 153 lb 4 oz (69.5 kg)   PainSc:  1    Body mass index is 28.96 kg/m.   PHYSICAL EXAM GENERAL:alert, no distress and comfortable SKIN: no rash  EYES: sclera clear NECK: without mass LYMPH:  no palpable cervical or supraclavicular lymphadenopathy  LUNGS:  normal breathing effort HEART: no lower extremity edema ABDOMEN: abdomen soft, non-tender and normal bowel sounds NEURO: alert & oriented x 3 with fluent speech, no focal motor/sensory deficits Breast exam: No nipple discharge or inversion.  S/p right lumpectomy, incision completely healed.  No palpable mass or nodularity in either breast or axilla that I could appreciate.   CBC    Component Value Date/Time   WBC 3.6 (L) 08/14/2023 1047   WBC 5.7 06/21/2021 1024   RBC 4.26 08/14/2023 1047   HGB 13.1 08/14/2023 1047   HCT 37.9 08/14/2023 1047   PLT 165 08/14/2023 1047   MCV 89.0 08/14/2023 1047   MCH 30.8 08/14/2023 1047   MCHC 34.6 08/14/2023 1047   RDW 12.4 08/14/2023 1047   LYMPHSABS 1.0 08/14/2023 1047   MONOABS  0.3 08/14/2023 1047   EOSABS 0.1 08/14/2023 1047   BASOSABS 0.0 08/14/2023 1047     CMP     Component Value Date/Time   NA 140 08/14/2023 1047   K 3.8 08/14/2023 1047   CL 105 08/14/2023 1047   CO2 30 08/14/2023 1047   GLUCOSE 131 (H) 08/14/2023 1047   BUN 20 08/14/2023 1047   CREATININE 0.71 08/14/2023 1047   CALCIUM 9.9 08/14/2023 1047   PROT 6.7 08/14/2023 1047   ALBUMIN 4.2 08/14/2023 1047   AST 22 08/14/2023 1047   ALT 17 08/14/2023 1047   ALKPHOS 85 08/14/2023 1047   BILITOT 1.0 08/14/2023 1047   GFRNONAA >60 08/14/2023 1047   GFRAA  10/29/2010 1040    >60        The eGFR has been calculated using the MDRD equation. This calculation has not been validated in all clinical situations. eGFR's persistently <60 mL/min signify possible Chronic Kidney Disease.     ASSESSMENT & PLAN:Briseida Warnke is a 77 y.o. female with    Malignant neoplasm of lower-outer quadrant of right breast of female, estrogen receptor positive, stage I, p(T1aN0M0), ER+/PR-/HER2+, Grade I  -diagnosed with ALH in right breast on 08/16/20 and high grade DCIS and ALH in 2022.  -s/p right lumpectomy on 04/16/21 with Dr Dwain Sarna, pathology showed a 0.15cm grade I invasive ductal carcinoma and components of high grade DCIS. -s/p radiation therapy 05/28/21 - 06/28/21 -she started anastrozole in 07/2021, held in 07/2022 due to her left knee pain. She restarted anastrozole in 01/2023 since her knee pain did not improved with holding anastrozole, she previously declined tamoxifen  -Ms. Wilfong is clinically doing well.  Tolerating anastrozole with weight gain and hair thinning/loss.  She takes a vitamin.  Breast exam is benign, labs are stable.  Overall no clinical concern for recurrence. -Will request her recent mammogram report. -We discussed antiestrogen alternatives such as letrozole and exemestane, she will research and let me know if she would like to switch to see if side effects are more  tolerable -Continue breast cancer surveillance, follow-up in 6 months or sooner if needed  Osteopenia after menopause -DEXA on 05/08/21 showed osteopenia (-2.1 in L1-4) -she reports she has tried alendronate but experienced increasing bone pain. She endorses taking vit D. -she previously declined any kind of bone medication  -She recently fell, no fractures        PLAN: -Labs reviewed -Continue breast cancer surveillance and anastrozole, refilled -She will let me know if she would like to try letrozole or exemestane to see if side effects are more tolerable -  Will request the recent mammogram report from [facility] -Follow-up in 6 months, or sooner if needed    All questions were answered. The patient knows to call the clinic with any problems, questions or concerns. No barriers to learning were detected.   Santiago Glad, NP-C 08/14/2023

## 2023-08-14 ENCOUNTER — Inpatient Hospital Stay: Payer: Medicare Other | Attending: Nurse Practitioner

## 2023-08-14 ENCOUNTER — Encounter: Payer: Self-pay | Admitting: Nurse Practitioner

## 2023-08-14 ENCOUNTER — Inpatient Hospital Stay: Payer: Medicare Other | Admitting: Nurse Practitioner

## 2023-08-14 VITALS — BP 142/92 | HR 70 | Temp 98.2°F | Resp 18 | Wt 153.2 lb

## 2023-08-14 DIAGNOSIS — Z170 Estrogen receptor positive status [ER+]: Secondary | ICD-10-CM

## 2023-08-14 DIAGNOSIS — C50511 Malignant neoplasm of lower-outer quadrant of right female breast: Secondary | ICD-10-CM

## 2023-08-14 DIAGNOSIS — Z923 Personal history of irradiation: Secondary | ICD-10-CM | POA: Insufficient documentation

## 2023-08-14 DIAGNOSIS — Z79811 Long term (current) use of aromatase inhibitors: Secondary | ICD-10-CM | POA: Diagnosis not present

## 2023-08-14 LAB — CMP (CANCER CENTER ONLY)
ALT: 17 U/L (ref 0–44)
AST: 22 U/L (ref 15–41)
Albumin: 4.2 g/dL (ref 3.5–5.0)
Alkaline Phosphatase: 85 U/L (ref 38–126)
Anion gap: 5 (ref 5–15)
BUN: 20 mg/dL (ref 8–23)
CO2: 30 mmol/L (ref 22–32)
Calcium: 9.9 mg/dL (ref 8.9–10.3)
Chloride: 105 mmol/L (ref 98–111)
Creatinine: 0.71 mg/dL (ref 0.44–1.00)
GFR, Estimated: >60 mL/min (ref >60)
Glucose, Bld: 131 mg/dL — ABNORMAL HIGH (ref 70–99)
Potassium: 3.8 mmol/L (ref 3.5–5.1)
Sodium: 140 mmol/L (ref 135–145)
Total Bilirubin: 1 mg/dL (ref 0.3–1.2)
Total Protein: 6.7 g/dL (ref 6.5–8.1)

## 2023-08-14 LAB — CBC WITH DIFFERENTIAL (CANCER CENTER ONLY)
Abs Immature Granulocytes: 0 10*3/uL (ref 0.00–0.07)
Basophils Absolute: 0 10*3/uL (ref 0.0–0.1)
Basophils Relative: 1 %
Eosinophils Absolute: 0.1 10*3/uL (ref 0.0–0.5)
Eosinophils Relative: 1 %
HCT: 37.9 % (ref 36.0–46.0)
Hemoglobin: 13.1 g/dL (ref 12.0–15.0)
Immature Granulocytes: 0 %
Lymphocytes Relative: 29 %
Lymphs Abs: 1 10*3/uL (ref 0.7–4.0)
MCH: 30.8 pg (ref 26.0–34.0)
MCHC: 34.6 g/dL (ref 30.0–36.0)
MCV: 89 fL (ref 80.0–100.0)
Monocytes Absolute: 0.3 10*3/uL (ref 0.1–1.0)
Monocytes Relative: 9 %
Neutro Abs: 2.1 10*3/uL (ref 1.7–7.7)
Neutrophils Relative %: 60 %
Platelet Count: 165 10*3/uL (ref 150–400)
RBC: 4.26 MIL/uL (ref 3.87–5.11)
RDW: 12.4 % (ref 11.5–15.5)
WBC Count: 3.6 10*3/uL — ABNORMAL LOW (ref 4.0–10.5)
nRBC: 0 % (ref 0.0–0.2)

## 2023-08-14 MED ORDER — ANASTROZOLE 1 MG PO TABS: ORAL_TABLET | ORAL | 3 refills | Status: DC

## 2023-08-15 DIAGNOSIS — H5203 Hypermetropia, bilateral: Secondary | ICD-10-CM | POA: Diagnosis not present

## 2023-08-15 DIAGNOSIS — H2513 Age-related nuclear cataract, bilateral: Secondary | ICD-10-CM | POA: Diagnosis not present

## 2023-08-15 DIAGNOSIS — H524 Presbyopia: Secondary | ICD-10-CM | POA: Diagnosis not present

## 2023-08-21 ENCOUNTER — Ambulatory Visit
Admission: RE | Admit: 2023-08-21 | Discharge: 2023-08-21 | Disposition: A | Discharge status: Home / Self Care | Payer: Medicare Other | Source: Ambulatory Visit | Attending: Radiation Oncology | Admitting: Radiation Oncology

## 2023-08-21 DIAGNOSIS — Z79811 Long term (current) use of aromatase inhibitors: Secondary | ICD-10-CM | POA: Diagnosis not present

## 2023-08-21 DIAGNOSIS — Z923 Personal history of irradiation: Secondary | ICD-10-CM | POA: Diagnosis not present

## 2023-08-21 DIAGNOSIS — C50511 Malignant neoplasm of lower-outer quadrant of right female breast: Secondary | ICD-10-CM | POA: Insufficient documentation

## 2023-08-21 DIAGNOSIS — Z170 Estrogen receptor positive status [ER+]: Secondary | ICD-10-CM | POA: Diagnosis not present

## 2023-08-21 NOTE — Progress Notes (Signed)
Radiation Oncology Follow up Note  Name: Tracy Horne   Date:   08/21/2023 MRN:  [id number] DOB: 04-23-1946    This 77 y.o. female presents to the clinic today for 2-year follow-up status post whole breast radiation to her right breast for stage Ia ER/PR positive invasive mammary carcinoma.  REFERRING PROVIDER: Merri Brunette, MD  HPI: Patient is a 77 year old female now out 2 years having completed whole breast radiation to her right breast for stage Ia ER/PR positive invasive mammary carcinoma.  Seen today in routine follow-up she is doing well she specifically denies breast tenderness cough or bone pain.  She is currently on Arimidex tolerating it well she has gained some weight..  Have not seen a follow-up mammogram on the patient.  COMPLICATIONS OF TREATMENT: none  FOLLOW UP COMPLIANCE: keeps appointments   PHYSICAL EXAM:  There were no vitals taken for this visit. Lungs are clear to A&P cardiac examination essentially unremarkable with regular rate and rhythm. No dominant mass or nodularity is noted in either breast in 2 positions examined. Incision is well-healed. No axillary or supraclavicular adenopathy is appreciated. Cosmetic result is excellent.  Well-developed well-nourished patient in NAD. HEENT reveals PERLA, EOMI, discs not visualized.  Oral cavity is clear. No oral mucosal lesions are identified. Neck is clear without evidence of cervical or supraclavicular adenopathy. Lungs are clear to A&P. Cardiac examination is essentially unremarkable with regular rate and rhythm without murmur rub or thrill. Abdomen is benign with no organomegaly or masses noted. Motor sensory and DTR levels are equal and symmetric in the upper and lower extremities. Cranial nerves II through XII are grossly intact. Proprioception is intact. No peripheral adenopathy or edema is identified. No motor or sensory levels are noted. Crude visual fields are within normal range.  RADIOLOGY RESULTS: We will try  to track down recent mammogram  PLAN: Present time patient is doing well with no evidence of disease.  Do not see a record of recent mammograms I am going to contact the patient and try to track that down.  Otherwise have asked to see her back in 1 year for follow-up.  Patient knows to call with any concerns.    Carmina Miller, MD

## 2023-08-25 ENCOUNTER — Encounter: Payer: Self-pay | Admitting: Radiation Oncology

## 2023-08-26 DIAGNOSIS — Z23 Encounter for immunization: Secondary | ICD-10-CM | POA: Diagnosis not present

## 2023-09-04 DIAGNOSIS — H4921 Sixth [abducent] nerve palsy, right eye: Secondary | ICD-10-CM | POA: Diagnosis not present

## 2023-09-04 NOTE — Progress Notes (Deleted)
Cardiology Clinic Note   Patient Name: Tracy Horne Date of Encounter: 09/04/2023  Primary Care Provider:  Fatima Sanger, FNP Primary Cardiologist:  None  Patient Profile    ***  Past Medical History    Past Medical History:  Diagnosis Date   Arthritis    shoulders and lower back per patient   Headache    Heart murmur    Hypertension    Thyroid disease    Past Surgical History:  Procedure Laterality Date   BREAST EXCISIONAL BIOPSY Left    BREAST LUMPECTOMY WITH RADIOACTIVE SEED LOCALIZATION Right 04/16/2021   Procedure: RIGHT BREAST LUMPECTOMY WITH RADIOACTIVE SEED LOCALIZATION;  Surgeon: Emelia Loron, MD;  Location: [facility] OR;  Service: General;  Laterality: Right;   CARPAL TUNNEL RELEASE Right    PILONIDAL CYST EXCISION     THYROID CYST EXCISION Right    TONSILLECTOMY     removed as a child    Allergies  Allergies  Allergen Reactions   Alendronate Sodium Other (See Comments)   Felodipine     Anxiety    Vitamin D (Calciferol)     History of Present Illness    ***  Home Medications    Prior to Admission medications   Medication Sig Start Date End Date Taking? Authorizing Provider  acetaminophen (TYLENOL) 325 MG tablet Take 650 mg by mouth every 6 (six) hours as needed for moderate pain.    [provider]  anastrozole (ARIMIDEX) 1 MG tablet TAKE 1 TABLET(1 MG) BY MOUTH DAILY 08/14/23   Pollyann Samples, NP  atorvastatin (LIPITOR) 20 MG tablet Take 1 tablet (20 mg total) by mouth daily. 08/09/22 08/21/23  Yates Decamp, MD  Cholecalciferol 100 MCG (4000 UT) CAPS Take 1 capsule by mouth daily.    [provider]  meloxicam (MOBIC) 7.5 MG tablet TAKE 1 TO 2 TABLETS(7.5 TO 15 MG) BY MOUTH DAILY 08/30/22   Malachy Mood, MD  metoprolol succinate (TOPROL-XL) 25 MG 24 hr tablet TAKE 1 TABLET(25 MG) BY MOUTH EVERY EVENING WITH OR IMMEDIATELY FOLLOWING A MEAL 12/27/22   Yates Decamp, MD  Multiple Vitamins-Minerals (HAIR FORMULA EXTRA STRENGTH  PO) Take 1 tablet by mouth daily.    [provider]  Omega-3 1000 MG CAPS Take 1,000 mg by mouth daily.    [provider]  OVER THE COUNTER MEDICATION Apply 1 application topically at bedtime as needed (pain). Magnesium Cream    [provider]  telmisartan-hydrochlorothiazide (MICARDIS HCT) 80-12.5 MG tablet Take 1 tablet by mouth daily.    [provider]  valACYclovir (VALTREX) 500 MG tablet TAKE 2 TABLETS(1000 MG) BY MOUTH TWICE DAILY Patient taking differently: Take 500 mg by mouth 2 (two) times daily as needed (outbreaks). 08/15/17   Levert Feinstein, MD    Family History    Family History  Problem Relation Age of Onset   Heart attack Mother    Heart failure Father    High blood pressure Brother    Cancer Maternal Grandmother 43       breast cancer   Breast cancer Maternal Grandmother    She indicated that her mother is deceased. She indicated that her father is deceased. She indicated that her brother is alive. She indicated that the status of her maternal grandmother is unknown.  Social History    Social History   Socioeconomic History   Marital status: Widowed    Spouse name: Not on file   Number of children: 1   Years  of education: Not on file   Highest education level: Not on file  Occupational History   Occupation: Retired  Tobacco Use   Smoking status: Never    Passive exposure: Yes   Smokeless tobacco: Never  Vaping Use   Vaping status: Never Used  Substance and Sexual Activity   Alcohol use: No   Drug use: No   Sexual activity: Not on file  Other Topics Concern   Not on file  Social History Narrative   Lives at home w/ her family   Right-handed   Caffeine: 2 cups of coffee per day, occasional sweet tea   Social Determinants of Health   Financial Resource Strain: Not on file  Food Insecurity: Not on file  Transportation Needs: Not on file  Physical Activity: Not on file  Stress: Not on file  Social Connections: Not  on file  Intimate Partner Violence: Not on file     Review of Systems    General:  No chills, fever, night sweats or weight changes.  Cardiovascular:  No chest pain, dyspnea on exertion, edema, orthopnea, palpitations, paroxysmal nocturnal dyspnea. Dermatological: No rash, lesions/masses Respiratory: No cough, dyspnea Urologic: No hematuria, dysuria Abdominal:   No nausea, vomiting, diarrhea, bright red blood per rectum, melena, or hematemesis Neurologic:  No visual changes, wkns, changes in mental status. All other systems reviewed and are otherwise negative except as noted above.  Physical Exam    VS:  There were no vitals taken for this visit. , BMI There is no height or weight on file to calculate BMI. GEN: Well nourished, well developed, in no acute distress. HEENT: normal. Neck: Supple, no JVD, carotid bruits, or masses. Cardiac: RRR, no murmurs, rubs, or gallops. No clubbing, cyanosis, edema.  Radials/DP/PT 2+ and equal bilaterally.  Respiratory:  Respirations regular and unlabored, clear to auscultation bilaterally. GI: Soft, nontender, nondistended, BS + x 4. MS: no deformity or atrophy. Skin: warm and dry, no rash. Neuro:  Strength and sensation are intact. Psych: Normal affect.  Accessory Clinical Findings    Recent Labs: 08/14/2023: ALT 17; BUN 20; Creatinine 0.71; Hemoglobin 13.1; Platelet Count 165; Potassium 3.8; Sodium 140   Recent Lipid Panel    Component Value Date/Time   CHOL 172 08/01/2022 1015   TRIG 55 08/01/2022 1015   HDL 87 08/01/2022 1015   LDLCALC 74 08/01/2022 1015    No BP recorded.  {Refresh Note OR Click here to enter BP  :1}***    ECG personally reviewed by me today- ***          Assessment & Plan   1.  ***   Thomasene Ripple. Annasofia Pohl NP-C     09/04/2023, 11:58 AM [facility] [address] Office [phone] Fax [phone]    I spent***minutes examining this patient, reviewing  medications, and using patient centered shared decision making involving her cardiac care.   I spent greater than 20 minutes reviewing her past medical history,  medications, and prior cardiac tests.

## 2023-09-05 ENCOUNTER — Ambulatory Visit: Payer: Medicare Other | Admitting: General Practice

## 2023-09-07 NOTE — Progress Notes (Unsigned)
Cardiology Clinic Note   Patient Name: Tracy Horne Date of Encounter: 09/08/2023  Primary Care Provider:  Fatima Sanger, FNP Primary Cardiologist: Dr. Jacinto Halim  Patient Profile    Tracy Horne 77 year old female presents to the clinic today for follow-up evaluation of her coronary artery disease and systolic murmur.  Past Medical History    Past Medical History:  Diagnosis Date   Arthritis    shoulders and lower back per patient   Headache    Heart murmur    Hypertension    Thyroid disease    Past Surgical History:  Procedure Laterality Date   BREAST EXCISIONAL BIOPSY Left    BREAST LUMPECTOMY WITH RADIOACTIVE SEED LOCALIZATION Right 04/16/2021   Procedure: RIGHT BREAST LUMPECTOMY WITH RADIOACTIVE SEED LOCALIZATION;  Surgeon: Emelia Loron, MD;  Location: [facility] OR;  Service: General;  Laterality: Right;   CARPAL TUNNEL RELEASE Right    PILONIDAL CYST EXCISION     THYROID CYST EXCISION Right    TONSILLECTOMY     removed as a child    Allergies  Allergies  Allergen Reactions   Alendronate Sodium Other (See Comments)   Felodipine     Anxiety    Vitamin D (Calciferol)     History of Present Illness    Tracy Horne has a PMH of elevated coronary artery calcium scoring Agatston score 338, hyperlipidemia, hypertension, systolic murmur, and rapid heartbeat.  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  Following her trip she developed a lower extremity swelling.  Her amlodipine was discontinued and her lower extremity swelling resolved.  She developed PACs and PVCs and had symptomatic palpitations.  Metoprolol was ordered.  She had also been started on a atorvastatin 20 mg daily.  She was seen in follow-up by Dr. Jacinto Halim on 08/09/2022.  During that time she was tolerating her metoprolol and atorvastatin well.  She was asymptomatic.  She presents to the clinic today for follow-up evaluation and states she noted some ankle swelling and  changes with her vision.  She presented to her optometrist who she had recently visited.  He re tested her eye pressures which were unchanged.  She reports that she had some old HCTZ 12.5 mg.  She took these along with her Micardis and stopped her metoprolol.  Her ankle swelling decreased.  She has been monitoring her blood pressure and has noted elevated diastolic blood pressures.  Reported numbers were in the 90s.  We reviewed her previous clinic visits and cardiac history.  She expressed understanding.  I will discontinue her metoprolol, continue her Micardis, order an extra 12.5 mg of HCTZ and repeat a BMP in 1-2 weeks.  We will plan follow-up in 2 to 3 months.  Today's she denies chest pain, shortness of breath, lower extremity edema, fatigue, palpitations, melena, hematuria, hemoptysis, diaphoresis, weakness, presyncope, syncope, orthopnea, and PND.    Home Medications    Prior to Admission medications   Medication Sig Start Date End Date Taking? Authorizing Provider  acetaminophen (TYLENOL) 325 MG tablet Take 650 mg by mouth every 6 (six) hours as needed for moderate pain.    [provider]  anastrozole (ARIMIDEX) 1 MG tablet TAKE 1 TABLET(1 MG) BY MOUTH DAILY 08/14/23   Pollyann Samples, NP  atorvastatin (LIPITOR) 20 MG tablet Take 1 tablet (20 mg total) by mouth daily. 08/09/22 08/21/23  Yates Decamp, MD  Cholecalciferol 100 MCG (4000 UT) CAPS Take 1 capsule by mouth daily.  [provider]  meloxicam (MOBIC) 7.5 MG tablet TAKE 1 TO 2 TABLETS(7.5 TO 15 MG) BY MOUTH DAILY 08/30/22   Malachy Mood, MD  metoprolol succinate (TOPROL-XL) 25 MG 24 hr tablet TAKE 1 TABLET(25 MG) BY MOUTH EVERY EVENING WITH OR IMMEDIATELY FOLLOWING A MEAL 12/27/22   Yates Decamp, MD  Multiple Vitamins-Minerals (HAIR FORMULA EXTRA STRENGTH PO) Take 1 tablet by mouth daily.    [provider]  Omega-3 1000 MG CAPS Take 1,000 mg by mouth daily.    [provider]  OVER THE COUNTER  MEDICATION Apply 1 application topically at bedtime as needed (pain). Magnesium Cream    [provider]  telmisartan-hydrochlorothiazide (MICARDIS HCT) 80-12.5 MG tablet Take 1 tablet by mouth daily.    [provider]  valACYclovir (VALTREX) 500 MG tablet TAKE 2 TABLETS(1000 MG) BY MOUTH TWICE DAILY Patient taking differently: Take 500 mg by mouth 2 (two) times daily as needed (outbreaks). 08/15/17   Levert Feinstein, MD    Family History    Family History  Problem Relation Age of Onset   Heart attack Mother    Heart failure Father    High blood pressure Brother    Cancer Maternal Grandmother 62       breast cancer   Breast cancer Maternal Grandmother    She indicated that her mother is deceased. She indicated that her father is deceased. She indicated that her brother is alive. She indicated that the status of her maternal grandmother is unknown.  Social History    Social History   Socioeconomic History   Marital status: Widowed    Spouse name: Not on file   Number of children: 1   Years of education: Not on file   Highest education level: Not on file  Occupational History   Occupation: Retired  Tobacco Use   Smoking status: Never    Passive exposure: Yes   Smokeless tobacco: Never  Vaping Use   Vaping status: Never Used  Substance and Sexual Activity   Alcohol use: No   Drug use: No   Sexual activity: Not on file  Other Topics Concern   Not on file  Social History Narrative   Lives at home w/ her family   Right-handed   Caffeine: 2 cups of coffee per day, occasional sweet tea   Social Determinants of Health   Financial Resource Strain: Not on file  Food Insecurity: Not on file  Transportation Needs: Not on file  Physical Activity: Not on file  Stress: Not on file  Social Connections: Not on file  Intimate Partner Violence: Not on file     Review of Systems    General:  No chills, fever, night sweats or weight changes.  Cardiovascular:  No  chest pain, dyspnea on exertion, edema, orthopnea, palpitations, paroxysmal nocturnal dyspnea. Dermatological: No rash, lesions/masses Respiratory: No cough, dyspnea Urologic: No hematuria, dysuria Abdominal:   No nausea, vomiting, diarrhea, bright red blood per rectum, melena, or hematemesis Neurologic:  No visual changes, wkns, changes in mental status. All other systems reviewed and are otherwise negative except as noted above.  Physical Exam    VS:  BP 120/88 (BP Location: Left Arm, Patient Position: Sitting, Cuff Size: Normal)   Pulse 87   Ht 5\' 1"  (1.549 m)   Wt 152 lb 6.4 oz (69.1 kg)   SpO2 98%   BMI 28.80 kg/m  , BMI Body mass index is 28.8 kg/m. GEN: Well nourished, well developed, in no acute  distress. HEENT: normal. Neck: Supple, no JVD, carotid bruits, or masses. Cardiac: RRR, no murmurs, rubs, or gallops. No clubbing, cyanosis, edema.  Radials/DP/PT 2+ and equal bilaterally.  Respiratory:  Respirations regular and unlabored, clear to auscultation bilaterally. GI: Soft, nontender, nondistended, BS + x 4. MS: no deformity or atrophy. Skin: warm and dry, no rash. Neuro:  Strength and sensation are intact. Psych: Normal affect.  Accessory Clinical Findings    Recent Labs: 08/14/2023: ALT 17; BUN 20; Creatinine 0.71; Hemoglobin 13.1; Platelet Count 165; Potassium 3.8; Sodium 140   Recent Lipid Panel    Component Value Date/Time   CHOL 172 08/01/2022 1015   TRIG 55 08/01/2022 1015   HDL 87 08/01/2022 1015   LDLCALC 74 08/01/2022 1015         ECG personally reviewed by me today- EKG Interpretation Date/Time:  Monday September 08 2023 15:41:19 EDT Ventricular Rate:  77 PR Interval:  168 QRS Duration:  76 QT Interval:  370 QTC Calculation: 418 R Axis:   -31  Text Interpretation: Normal sinus rhythm Left axis deviation When compared with ECG of 10-Apr-2021 11:52, No significant change was found Confirmed by Edd Fabian [phone]) on 09/08/2023 4:34:56 PM            Assessment & Plan   1.  Coronary artery disease-noted to have elevated coronary artery calcium score.  Reports compliance with a atorvastatin. Heart healthy low-sodium high-fiber diet Continue atorvastatin, metoprolol Maintain physical activity  Essential hypertension BP today 120/ 88. Continue Micardis Add extra 12.5 mg of hydrochlorothiazide Discontinue metoprolol Maintain blood pressure log Avoid high sodium foods, caffeine, chocolate, increase stress Maintain physical activity  Hyperlipidemia-LDL 95 on 07/01/2023. Continue atorvastatin High-fiber diet  Cardiac murmur-mild mitral valve regurgitation noted on echocardiogram 07/16/2022.  No increased DOE or activity intolerance. Order echocardiogram when clinically indicated  PACs/PVCs-EKG normal sinus rhythm left axis deviation 77 bpm.  Denies recent episodes of lightheadedness, presyncope or syncope.  Denies sustained episodes of palpitations. Continue metoprolol Avoid triggers caffeine, chocolate, EtOH, dehydration etc.  Disposition: Follow-up with Dr. Jacinto Halim or me in 2-3 months   Thomasene Ripple. Jaykub Mackins NP-C     09/08/2023, 4:34 PM [facility] [address] Office [phone] Fax [phone]    I spent 14 minutes examining this patient, reviewing medications, and using patient centered shared decision making involving her cardiac care.   I spent greater than 20 minutes reviewing her past medical history,  medications, and prior cardiac tests.

## 2023-09-08 ENCOUNTER — Encounter: Payer: Self-pay | Admitting: General Practice

## 2023-09-08 ENCOUNTER — Ambulatory Visit: Payer: Medicare Other | Attending: General Practice | Admitting: General Practice

## 2023-09-08 VITALS — BP 120/88 | HR 87 | Ht 61.0 in | Wt 152.4 lb

## 2023-09-08 DIAGNOSIS — R000 Tachycardia, unspecified: Secondary | ICD-10-CM | POA: Insufficient documentation

## 2023-09-08 DIAGNOSIS — R011 Cardiac murmur, unspecified: Secondary | ICD-10-CM | POA: Diagnosis not present

## 2023-09-08 DIAGNOSIS — I10 Essential (primary) hypertension: Secondary | ICD-10-CM | POA: Insufficient documentation

## 2023-09-08 DIAGNOSIS — R931 Abnormal findings on diagnostic imaging of heart and coronary circulation: Secondary | ICD-10-CM | POA: Insufficient documentation

## 2023-09-08 DIAGNOSIS — E7800 Pure hypercholesterolemia, unspecified: Secondary | ICD-10-CM | POA: Diagnosis not present

## 2023-09-08 MED ORDER — HYDROCHLOROTHIAZIDE 12.5 MG PO CAPS
12.5000 mg | ORAL_CAPSULE | Freq: Every day | ORAL | 3 refills | Status: DC
Start: 2023-09-08 — End: 2023-11-17

## 2023-09-08 NOTE — Patient Instructions (Addendum)
Medication Instructions:  START ADDITIONAL HYDROCHLOROTHIAZIDE 12-5MG  STOP METOPROLOL SUCC *If you need a refill on your cardiac medications before your next appointment, please call your pharmacy*  Lab Work: BMET TODAY  Other Instructions PLEASE READ AND FOLLOW ATTACHED  SALTY 6  Follow-Up: At [facility], you and your health needs are our priority.  As part of our continuing mission to provide you with exceptional heart care, we have created designated Provider Care Teams.  These Care Teams include your primary Cardiologist (physician) and Advanced Practice Providers (APPs -  Physician Assistants and Nurse Practitioners) who all work together to provide you with the care you need, when you need it.  Your next appointment:   2 month(s)  Provider:   Yates Decamp, MD  or Edd Fabian, FNP

## 2023-09-09 ENCOUNTER — Other Ambulatory Visit: Payer: Self-pay

## 2023-09-09 DIAGNOSIS — R931 Abnormal findings on diagnostic imaging of heart and coronary circulation: Secondary | ICD-10-CM

## 2023-09-09 DIAGNOSIS — R000 Tachycardia, unspecified: Secondary | ICD-10-CM

## 2023-09-09 DIAGNOSIS — I10 Essential (primary) hypertension: Secondary | ICD-10-CM

## 2023-09-09 DIAGNOSIS — E7800 Pure hypercholesterolemia, unspecified: Secondary | ICD-10-CM

## 2023-09-09 LAB — BASIC METABOLIC PANEL
BUN/Creatinine Ratio: 29 — ABNORMAL HIGH (ref 12–28)
BUN: 19 mg/dL (ref 8–27)
CO2: 24 mmol/L (ref 20–29)
Calcium: 10.6 mg/dL — ABNORMAL HIGH (ref 8.7–10.3)
Chloride: 100 mmol/L (ref 96–106)
Creatinine, Ser: 0.66 mg/dL (ref 0.57–1.00)
Glucose: 90 mg/dL (ref 70–99)
Potassium: 4.1 mmol/L (ref 3.5–5.2)
Sodium: 142 mmol/L (ref 134–144)
eGFR: 90 mL/min/{1.73_m2} (ref >59)

## 2023-09-12 ENCOUNTER — Other Ambulatory Visit: Payer: Self-pay | Admitting: Cardiology

## 2023-09-12 DIAGNOSIS — E7800 Pure hypercholesterolemia, unspecified: Secondary | ICD-10-CM

## 2023-09-12 DIAGNOSIS — R931 Abnormal findings on diagnostic imaging of heart and coronary circulation: Secondary | ICD-10-CM

## 2023-09-18 DIAGNOSIS — H4921 Sixth [abducent] nerve palsy, right eye: Secondary | ICD-10-CM | POA: Diagnosis not present

## 2023-09-18 DIAGNOSIS — Z23 Encounter for immunization: Secondary | ICD-10-CM | POA: Diagnosis not present

## 2023-10-01 DIAGNOSIS — J45909 Unspecified asthma, uncomplicated: Secondary | ICD-10-CM | POA: Diagnosis not present

## 2023-10-02 ENCOUNTER — Ambulatory Visit: Payer: No Typology Code available for payment source | Admitting: General Practice

## 2023-10-27 ENCOUNTER — Other Ambulatory Visit: Payer: Self-pay | Admitting: Cardiology

## 2023-10-27 DIAGNOSIS — I10 Essential (primary) hypertension: Secondary | ICD-10-CM

## 2023-10-28 NOTE — Progress Notes (Signed)
Cardiology Clinic Note   Patient Name: Tracy Horne Date of Encounter: 10/31/2023  Primary Care Provider:  Fatima Sanger, FNP Primary Cardiologist: Dr. Jacinto Halim  Patient Profile    Tracy Horne 77 year old female presents to the clinic today for follow-up evaluation of her coronary artery disease and systolic murmur.  Past Medical History    Past Medical History:  Diagnosis Date   Arthritis    shoulders and lower back per patient   Headache    Heart murmur    Hypertension    Thyroid disease    Past Surgical History:  Procedure Laterality Date   BREAST EXCISIONAL BIOPSY Left    BREAST LUMPECTOMY WITH RADIOACTIVE SEED LOCALIZATION Right 04/16/2021   Procedure: RIGHT BREAST LUMPECTOMY WITH RADIOACTIVE SEED LOCALIZATION;  Surgeon: Emelia Loron, MD;  Location: [facility] OR;  Service: General;  Laterality: Right;   CARPAL TUNNEL RELEASE Right    PILONIDAL CYST EXCISION     THYROID CYST EXCISION Right    TONSILLECTOMY     removed as a child    Allergies  Allergies  Allergen Reactions   Alendronate Sodium Other (See Comments)   Felodipine     Anxiety    Vitamin D (Calciferol)     History of Present Illness    [name] has a PMH of elevated coronary artery calcium scoring Agatston score 338, hyperlipidemia, hypertension, systolic murmur, and rapid heartbeat.  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  Following her trip she developed a lower extremity swelling.  Her amlodipine was discontinued and her lower extremity swelling resolved.  She developed PACs and PVCs and had symptomatic palpitations.  Metoprolol was ordered.  She had also been started on a atorvastatin 20 mg daily.  She was seen in follow-up by Dr. Jacinto Halim on 08/09/2022.  During that time she was tolerating her metoprolol and atorvastatin well.  She was asymptomatic.  She presented to the clinic 09/08/23 for follow-up evaluation and stated she noted some ankle swelling  and changes with her vision.  She presented to her optometrist and  He re tested her eye pressures which were unchanged.  She reported that she had some old HCTZ 12.5 mg.  She took these along with her Micardis and stopped her metoprolol.  Her ankle swelling decreased.  She had been monitoring her blood pressure and had noted elevated diastolic blood pressures.  Reported numbers were in the 90s.  We reviewed her previous clinic visits and cardiac history.  She expressed understanding.  I discontinued her metoprolol, continued her Micardis, ordered 12.5 mg of HCTZ and planned repeat a BMP in 1-2 weeks.   Follow-up in 2 to 3 months was planned.  BMP was not completed.  She presents to the clinic today for follow-up evaluation and states she has noticed that her blood pressure is much better with her increased hydrochlorothiazide.  She has been noticing some increased work of breathing with increased physical activity.  She continues to be very active.  She did notice some chest tightness in the setting of upper respiratory infection.  She received some antibiotics from her PCP and her symptoms resolved.  She does note some increased urgency with increased hydrochlorothiazide.  We reviewed this.  She has been wearing her lower extremity support stockings.  I will order a BMP today, have her continue her physical activity, continue heart healthy low-sodium diet, and plan follow-up in 9 to 12 months.  Today's she denies chest  pain, shortness of breath, lower extremity edema, fatigue, palpitations, melena, hematuria, hemoptysis, diaphoresis, weakness, presyncope, syncope, orthopnea, and PND.    Home Medications    Prior to Admission medications   Medication Sig Start Date End Date Taking? Authorizing Provider  acetaminophen (TYLENOL) 325 MG tablet Take 650 mg by mouth every 6 (six) hours as needed for moderate pain.    [provider]  anastrozole (ARIMIDEX) 1 MG tablet TAKE 1 TABLET(1 MG) BY MOUTH  DAILY 08/14/23   Pollyann Samples, NP  atorvastatin (LIPITOR) 20 MG tablet Take 1 tablet (20 mg total) by mouth daily. 08/09/22 08/21/23  Yates Decamp, MD  Cholecalciferol 100 MCG (4000 UT) CAPS Take 1 capsule by mouth daily.    [provider]  meloxicam (MOBIC) 7.5 MG tablet TAKE 1 TO 2 TABLETS(7.5 TO 15 MG) BY MOUTH DAILY 08/30/22   Malachy Mood, MD  metoprolol succinate (TOPROL-XL) 25 MG 24 hr tablet TAKE 1 TABLET(25 MG) BY MOUTH EVERY EVENING WITH OR IMMEDIATELY FOLLOWING A MEAL 12/27/22   Yates Decamp, MD  Multiple Vitamins-Minerals (HAIR FORMULA EXTRA STRENGTH PO) Take 1 tablet by mouth daily.    [provider]  Omega-3 1000 MG CAPS Take 1,000 mg by mouth daily.    [provider]  OVER THE COUNTER MEDICATION Apply 1 application topically at bedtime as needed (pain). Magnesium Cream    [provider]  telmisartan-hydrochlorothiazide (MICARDIS HCT) 80-12.5 MG tablet Take 1 tablet by mouth daily.    [provider]  valACYclovir (VALTREX) 500 MG tablet TAKE 2 TABLETS(1000 MG) BY MOUTH TWICE DAILY Patient taking differently: Take 500 mg by mouth 2 (two) times daily as needed (outbreaks). 08/15/17   Levert Feinstein, MD    Family History    Family History  Problem Relation Age of Onset   Heart attack Mother    Heart failure Father    High blood pressure Brother    Cancer Maternal Grandmother 48       breast cancer   Breast cancer Maternal Grandmother    She indicated that her mother is deceased. She indicated that her father is deceased. She indicated that her brother is alive. She indicated that the status of her maternal grandmother is unknown.  Social History    Social History   Socioeconomic History   Marital status: Widowed    Spouse name: Not on file   Number of children: 1   Years of education: Not on file   Highest education level: Not on file  Occupational History   Occupation: Retired  Tobacco Use   Smoking status: Never    Passive  exposure: Yes   Smokeless tobacco: Never  Vaping Use   Vaping status: Never Used  Substance and Sexual Activity   Alcohol use: No   Drug use: No   Sexual activity: Not on file  Other Topics Concern   Not on file  Social History Narrative   Lives at home w/ her family   Right-handed   Caffeine: 2 cups of coffee per day, occasional sweet tea   Social Drivers of Corporate investment banker Strain: Not on file  Food Insecurity: Not on file  Transportation Needs: Not on file  Physical Activity: Not on file  Stress: Not on file  Social Connections: Not on file  Intimate Partner Violence: Not on file     Review of Systems    General:  No chills, fever, night sweats or weight changes.  Cardiovascular:  No chest  pain, dyspnea on exertion, edema, orthopnea, palpitations, paroxysmal nocturnal dyspnea. Dermatological: No rash, lesions/masses Respiratory: No cough, dyspnea Urologic: No hematuria, dysuria Abdominal:   No nausea, vomiting, diarrhea, bright red blood per rectum, melena, or hematemesis Neurologic:  No visual changes, wkns, changes in mental status. All other systems reviewed and are otherwise negative except as noted above.  Physical Exam    VS:  BP 116/74 (BP Location: Left Arm, Patient Position: Sitting, Cuff Size: Normal)   Pulse 76   Ht 5\' 1"  (1.549 m)   Wt 151 lb 9.6 oz (68.8 kg)   SpO2 97%   BMI 28.64 kg/m  , BMI Body mass index is 28.64 kg/m. GEN: Well nourished, well developed, in no acute distress. HEENT: normal. Neck: Supple, no JVD, carotid bruits, or masses. Cardiac: RRR, no murmurs, rubs, or gallops. No clubbing, cyanosis, ankle edema left greater than right.  Radials/DP/PT 2+ and equal bilaterally.  Respiratory:  Respirations regular and unlabored, clear to auscultation bilaterally. GI: Soft, nontender, nondistended, BS + x 4. MS: no deformity or atrophy. Skin: warm and dry, no rash. Neuro:  Strength and sensation are intact. Psych: Normal  affect.  Accessory Clinical Findings    Recent Labs: 08/14/2023: ALT 17; Hemoglobin 13.1; Platelet Count 165 09/08/2023: BUN 19; Creatinine, Ser 0.66; Potassium 4.1; Sodium 142   Recent Lipid Panel    Component Value Date/Time   CHOL 172 08/01/2022 1015   TRIG 55 08/01/2022 1015   HDL 87 08/01/2022 1015   LDLCALC 74 08/01/2022 1015         ECG personally reviewed by me today- none today.           Assessment & Plan   1.  Essential hypertension BP today 116/74. Continue Micardis, HCTZ Continue to maintain blood pressure log Avoid high sodium foods, caffeine, chocolate, increase stress-reviewed Maintain physical activity-has increased physical activity. Ordered BMP  Coronary artery disease-previously noted to have elevated coronary artery calcium score.  Denies anginal symptoms at rest and with increased physical activity. Heart healthy low-sodium high-fiber diet Continue atorvastatin, metoprolol Maintain physical activity  Hyperlipidemia-LDL 95 on 07/01/2023. Continue atorvastatin High-fiber diet Increase physical activity as tolerated  PACs/PVCs-heart rate today 76.  Notes occasional palpitations.  Denies sustained episodes of accelerated or irregular heartbeat. Continue metoprolol Avoid triggers caffeine, chocolate, EtOH, dehydration etc Maintain physical activity  Cardiac murmur-stable activity tolerance.  Mild mitral valve regurgitation noted on echocardiogram 07/16/2022.   Order echocardiogram when clinically indicated    Disposition: Follow-up with Dr. Jacinto Halim or me in 9-12 months   Thomasene Ripple. Farah Lepak NP-C     10/31/2023, 11:54 AM [facility] [address] Office [phone] Fax [phone]    I spent 14 minutes examining this patient, reviewing medications, and using patient centered shared decision making involving her cardiac care.   I spent greater than 20 minutes reviewing her past medical history,   medications, and prior cardiac tests.

## 2023-10-31 ENCOUNTER — Ambulatory Visit: Payer: Medicare Other | Attending: General Practice | Admitting: General Practice

## 2023-10-31 ENCOUNTER — Encounter: Payer: Self-pay | Admitting: General Practice

## 2023-10-31 VITALS — BP 116/74 | HR 76 | Ht 61.0 in | Wt 151.6 lb

## 2023-10-31 DIAGNOSIS — I491 Atrial premature depolarization: Secondary | ICD-10-CM

## 2023-10-31 DIAGNOSIS — I493 Ventricular premature depolarization: Secondary | ICD-10-CM | POA: Diagnosis not present

## 2023-10-31 DIAGNOSIS — E7800 Pure hypercholesterolemia, unspecified: Secondary | ICD-10-CM | POA: Diagnosis not present

## 2023-10-31 DIAGNOSIS — R931 Abnormal findings on diagnostic imaging of heart and coronary circulation: Secondary | ICD-10-CM

## 2023-10-31 DIAGNOSIS — R000 Tachycardia, unspecified: Secondary | ICD-10-CM

## 2023-10-31 DIAGNOSIS — R011 Cardiac murmur, unspecified: Secondary | ICD-10-CM | POA: Diagnosis not present

## 2023-10-31 DIAGNOSIS — I10 Essential (primary) hypertension: Secondary | ICD-10-CM

## 2023-10-31 NOTE — Patient Instructions (Signed)
Medication Instructions:  The current medical regimen is effective;  continue present plan and medications as directed. Please refer to the Current Medication list given to you today.  *If you need a refill on your cardiac medications before your next appointment, please call your pharmacy*  Lab Work: BMET TODAY If you have labs (blood work) drawn today and your tests are completely normal, you will receive your results only by:  MyChart Message (if you have MyChart) OR  A paper copy in the mail If you have any lab test that is abnormal or we need to change your treatment, we will call you to review the results.  Follow-Up: At [facility], you and your health needs are our priority.  As part of our continuing mission to provide you with exceptional heart care, we have created designated Provider Care Teams.  These Care Teams include your primary Cardiologist (physician) and Advanced Practice Providers (APPs -  Physician Assistants and Nurse Practitioners) who all work together to provide you with the care you need, when you need it.  Your next appointment:   9-12 month(s)  Provider:   Yates Decamp, MD  or Edd Fabian, FNP

## 2023-11-01 LAB — BASIC METABOLIC PANEL
BUN/Creatinine Ratio: 31 — ABNORMAL HIGH (ref 12–28)
BUN: 21 mg/dL (ref 8–27)
CO2: 25 mmol/L (ref 20–29)
Calcium: 10.4 mg/dL — ABNORMAL HIGH (ref 8.7–10.3)
Chloride: 101 mmol/L (ref 96–106)
Creatinine, Ser: 0.68 mg/dL (ref 0.57–1.00)
Glucose: 91 mg/dL (ref 70–99)
Potassium: 3.8 mmol/L (ref 3.5–5.2)
Sodium: 142 mmol/L (ref 134–144)
eGFR: 90 mL/min/{1.73_m2} (ref >59)

## 2023-11-13 ENCOUNTER — Encounter: Payer: Self-pay | Admitting: Nurse Practitioner

## 2023-11-14 ENCOUNTER — Telehealth: Payer: Self-pay | Admitting: *Deleted

## 2023-11-14 NOTE — Telephone Encounter (Signed)
 Looks like she is scheduled with you, Lacie 11/17/2023.

## 2023-11-14 NOTE — Telephone Encounter (Signed)
 Patient called with concerns for Right breast pain and tenderness. This started about 2-3 weeks ago and has increased intensity and area. She reports tenderness and shooting pains at rest and regardless of supporting bra. She notices firmness but does not describe it as a lump. It is tender to palpitate.   Patient requested to be seen via MyChart and in phone call. Appointment scheduled in next available with Lacie, NP or Dr. Lanny per her request.

## 2023-11-15 ENCOUNTER — Other Ambulatory Visit: Payer: Self-pay | Admitting: General Practice

## 2023-11-16 NOTE — Progress Notes (Deleted)
 Patient Care Team: Royden Ronal Czar, FNP as PCP - General (Internal Medicine) Ladona Heinz, MD as PCP - Cardiology (Cardiology) Leni Marjory MATSU, MD (Rheumatology) Tyree Nanetta SAILOR, RN as Oncology Nurse Navigator Glean Stephane BROCKS, RN as Oncology Nurse Navigator Ebbie Cough, MD as Consulting Physician (General Surgery) Mishayla Sliwinski K, NP as Nurse Practitioner (Nurse Practitioner) Lanny Callander, MD as Consulting Physician (Hematology)   CHIEF COMPLAINT: Follow up breast pain   Oncology History Overview Note  Cancer Staging Malignant neoplasm of lower-outer quadrant of right breast of female, estrogen receptor positive (HCC) Staging form: Breast, AJCC 8th Edition - Pathologic stage from 04/16/2021: Stage Unknown (pT1a, pNX, G1, ER+, PR-, HER2+) - Signed by Lanny Callander, MD on 05/06/2021 Stage prefix: Initial diagnosis Multigene prognostic tests performed: None Histologic grading system: 3 grade system Residual tumor (R): R0 - None    Malignant neoplasm of lower-outer quadrant of right breast of female, estrogen receptor positive (HCC)  08/16/2020 Pathology Results   Diagnosis Breast, right, needle core biopsy - LOBULAR NEOPLASIA (ATYPICAL LOBULAR HYPERPLASIA) WITH MICROCALCIFICATIONS - SEE COMMENT Microscopic Comment These results were called to [facility] on August 17, 2020.   03/13/2021 Mammogram   Right mammogram 03/13/21 There are pleomorphic calcifications spanning 1.6cm in the slightly linear configuration within the right lower outer breast anterior to middle depth 2.7cm from the nipple. These are increased in number. No other significant masses or calcifications.    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 - LOBULAR NEOPLASIA (ATYPICAL LOBULAR HYPERPLASIA). Microscopic Comment The carcinoma  appears high grade. E-cadherin is pending and will be reported in an addendum. Dr. Belvie has reviewed the case. The case was called to Dr. Vonzell on 03/21/2021.  E-cadherin is positive in the areas of in situ carcinoma and thus, these areas are consistent with high grade ductal carcinoma in situ. E-cadherin is negative in areas of atypical lobular hyperplasia.   04/16/2021 Cancer Staging   Staging form: Breast, AJCC 8th Edition - Pathologic stage from 04/16/2021: Stage Unknown (pT1a, pNX, G1, ER+, PR-, HER2+) - Signed by Lanny Callander, MD on 05/06/2021 Stage prefix: Initial diagnosis Multigene prognostic tests performed: None Histologic grading system: 3 grade system Residual tumor (R): R0 - None   04/16/2021 Surgery   RIGHT BREAST LUMPECTOMY WITH RADIOACTIVE SEED LOCALIZATION by Dr Ebbie    FINAL MICROSCOPIC DIAGNOSIS:   A. 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   All controls stained appropriately.    05/06/2021 Initial Diagnosis   Malignant neoplasm of lower-outer quadrant of right breast of female, estrogen receptor positive (  HCC)   05/24/2021 - 06/28/2021 Radiation Therapy   Whole breast radiation by Dr. Marcey Penton    07/2021 -  Anti-estrogen oral therapy   Adjuvant Anastrozole    10/30/2021 Survivorship   SCP delivered by Andreal Vultaggio, NP      CURRENT THERAPY: Adjuvant anti-estrogen with Anastrozole , starting 07/2021  INTERVAL HISTORY Ms. Tracy Horne presents for symptom management visit for right breast pain. Approx 2-3 weeks ago developed tenderness and shooting pains at rest despite wearing support bra  ROS   Past Medical History:  Diagnosis Date   Arthritis    shoulders and lower back per patient   Headache    Heart murmur    Hypertension    Thyroid  disease      Past Surgical History:  Procedure Laterality Date   BREAST EXCISIONAL BIOPSY Left    BREAST LUMPECTOMY WITH RADIOACTIVE SEED LOCALIZATION Right 04/16/2021   Procedure: RIGHT BREAST LUMPECTOMY WITH RADIOACTIVE SEED LOCALIZATION;  Surgeon: Ebbie Cough, MD;  Location: [facility] OR;  Service: General;  Laterality: Right;   CARPAL TUNNEL RELEASE Right    PILONIDAL CYST EXCISION     THYROID  CYST EXCISION Right    TONSILLECTOMY     removed as a child     Outpatient Encounter Medications as of 11/17/2023  Medication Sig   acetaminophen  (TYLENOL ) 325 MG tablet Take 650 mg by mouth every 6 (six) hours as needed for moderate pain.   albuterol (VENTOLIN HFA) 108 (90 Base) MCG/ACT inhaler Inhale 1-2 puffs into the lungs as needed.   anastrozole  (ARIMIDEX ) 1 MG tablet TAKE 1 TABLET(1 MG) BY MOUTH DAILY   atorvastatin  (LIPITOR) 20 MG tablet TAKE 1 TABLET(20 MG) BY MOUTH DAILY   Cholecalciferol 100 MCG (4000 UT) CAPS Take 1 capsule by mouth daily.   hydrochlorothiazide  (MICROZIDE ) 12.5 MG capsule Take 1 capsule (12.5 mg total) by mouth daily.   ibuprofen (ADVIL) 200 MG tablet Take 200 mg by mouth as needed.   meloxicam  (MOBIC ) 7.5 MG tablet TAKE 1 TO 2 TABLETS(7.5 TO 15 MG) BY MOUTH DAILY (Patient taking differently: Take 7.5 mg by mouth as needed.)   Multiple Vitamins-Minerals (HAIR FORMULA EXTRA STRENGTH PO) Take 1 tablet by mouth daily.   Omega-3 1000 MG CAPS Take 1,000 mg by mouth daily.   omega-3 acid ethyl esters (LOVAZA) 1 g capsule Take 2 g by  mouth daily.   OVER THE COUNTER MEDICATION Apply 1 application topically at bedtime as needed (pain). Magnesium Cream   telmisartan -hydrochlorothiazide  (MICARDIS  HCT) 80-12.5 MG tablet Take 1 tablet by mouth daily.   valACYclovir  (VALTREX ) 500 MG tablet TAKE 2 TABLETS(1000 MG) BY MOUTH TWICE DAILY (Patient taking differently: Take 500 mg by mouth as needed (outbreaks).)   No facility-administered encounter medications on file as of 11/17/2023.     There were no vitals filed for this visit. There is no height or weight on file to calculate BMI.   PHYSICAL EXAM GENERAL:alert, no distress and comfortable SKIN: no rash  EYES: sclera clear NECK: without mass LYMPH:  no palpable cervical or supraclavicular lymphadenopathy  LUNGS: clear with normal breathing effort HEART: regular rate & rhythm, no lower extremity edema ABDOMEN: abdomen soft, non-tender and normal bowel sounds NEURO: alert & oriented x 3 with fluent speech, no focal motor/sensory deficits Breast exam:  PAC without erythema    CBC    Component Value Date/Time   WBC 3.6 (L) 08/14/2023 1047   WBC 5.7 06/21/2021 1024   RBC 4.26 08/14/2023 1047   HGB 13.1  08/14/2023 1047   HCT 37.9 08/14/2023 1047   PLT 165 08/14/2023 1047   MCV 89.0 08/14/2023 1047   MCH 30.8 08/14/2023 1047   MCHC 34.6 08/14/2023 1047   RDW 12.4 08/14/2023 1047   LYMPHSABS 1.0 08/14/2023 1047   MONOABS 0.3 08/14/2023 1047   EOSABS 0.1 08/14/2023 1047   BASOSABS 0.0 08/14/2023 1047     CMP     Component Value Date/Time   NA 142 10/31/2023 1211   K 3.8 10/31/2023 1211   CL 101 10/31/2023 1211   CO2 25 10/31/2023 1211   GLUCOSE 91 10/31/2023 1211   GLUCOSE 131 (H) 08/14/2023 1047   BUN 21 10/31/2023 1211   CREATININE 0.68 10/31/2023 1211   CREATININE 0.71 08/14/2023 1047   CALCIUM  10.4 (H) 10/31/2023 1211   PROT 6.7 08/14/2023 1047   ALBUMIN 4.2 08/14/2023 1047   AST 22 08/14/2023 1047   ALT 17 08/14/2023 1047   ALKPHOS 85 08/14/2023 1047    BILITOT 1.0 08/14/2023 1047   GFRNONAA >60 08/14/2023 1047   GFRAA  10/29/2010 1040    >60        The eGFR has been calculated using the MDRD equation. This calculation has not been validated in all clinical situations. eGFR's persistently <60 mL/min signify possible Chronic Kidney Disease.     ASSESSMENT & PLAN:Allyson Haring is a 78 y.o. female with    Malignant neoplasm of lower-outer quadrant of right breast of female, estrogen receptor positive, stage I, p(T1aN0M0), ER+/PR-/HER2+, Grade I  -diagnosed with ALH in right breast on 08/16/20 and high grade DCIS and ALH in 2022.  -s/p right lumpectomy on 04/16/21 with Dr Ebbie, pathology showed a 0.15cm grade I invasive ductal carcinoma and components of high grade DCIS. -s/p radiation therapy 05/28/21 - 06/28/21 -she started anastrozole  in 07/2021, held in 07/2022 due to her left knee pain. She restarted anastrozole  in 01/2023 since her knee pain did not improved with holding anastrozole , she previously declined tamoxifen  -08/14/2023: Clinically doing well.  Tolerating anastrozole  with weight gain and hair thinning/loss.  She takes a vitamin.  Breast exam is benign, labs are stable.  Overall no clinical concern for recurrence. Will request her recent mammogram report. We discussed antiestrogen alternatives such as letrozole and exemestane, she will research and let me know if she would like to switch to see if side effects are more tolerable. Continue breast cancer surveillance, follow-up in 6 months or sooner if needed -11/13/2023 messaged patient regarding incomplete mammogram from [facility] 04/2023, she replied with reports of right breast pain and was brought in.   Osteopenia after menopause -DEXA on 05/08/21 showed osteopenia (-2.1 in L1-4) -she reports she has tried alendronate but experienced increasing bone pain. She endorses taking vit D. -she previously declined any kind of bone medication  -She recently fell, no fractures       PLAN:  No orders of the defined types were placed in this encounter.     All questions were answered. The patient knows to call the clinic with any problems, questions or concerns. No barriers to learning were detected. I spent *** counseling the patient face to face. The total time spent in the appointment was *** and more than 50% was on counseling, review of test results, and coordination of care.   Jamarquis Crull, NP-C @DATE @

## 2023-11-17 ENCOUNTER — Inpatient Hospital Stay: Payer: Medicare Other | Admitting: Nurse Practitioner

## 2023-11-17 ENCOUNTER — Telehealth: Payer: Self-pay | Admitting: Nurse Practitioner

## 2023-12-25 NOTE — Progress Notes (Signed)
 Patient Care Team: Fatima Sanger, FNP as PCP - General (Internal Medicine) Yates Decamp, MD as PCP - Cardiology (Cardiology) Rossie Muskrat, MD (Rheumatology) Donnelly Angelica, RN as Oncology Nurse Navigator Pershing Proud, RN as Oncology Nurse Navigator Emelia Loron, MD as Consulting Physician (General Surgery) Pollyann Samples, NP as Nurse Practitioner (Nurse Practitioner) Malachy Mood, MD as Consulting Physician (Hematology)   CHIEF COMPLAINT: Follow up right breast cancer   Oncology History Overview Note  Cancer Staging Malignant neoplasm of lower-outer quadrant of right breast of female, estrogen receptor positive (HCC) Staging form: Breast, AJCC 8th Edition - Pathologic stage from 04/16/2021: Stage Unknown (pT1a, pNX, G1, ER+, PR-, HER2+) - Signed by Malachy Mood, MD on 05/06/2021 Stage prefix: Initial diagnosis Multigene prognostic tests performed: None Histologic grading system: 3 grade system Residual tumor (R): R0 - None    Malignant neoplasm of lower-outer quadrant of right breast of female, estrogen receptor positive (HCC)  08/16/2020 Pathology Results   Diagnosis Breast, right, needle core biopsy - LOBULAR NEOPLASIA (ATYPICAL LOBULAR HYPERPLASIA) WITH MICROCALCIFICATIONS - SEE COMMENT Microscopic Comment These results were called to [facility] on August 17, 2020.   03/13/2021 Mammogram   Right mammogram 03/13/21 There are pleomorphic calcifications spanning 1.6cm in the slightly linear configuration within the right lower outer breast anterior to middle depth 2.7cm from the nipple. These are increased in number. No other significant masses or calcifications.    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 - LOBULAR NEOPLASIA (ATYPICAL LOBULAR HYPERPLASIA). Microscopic Comment The  carcinoma appears high grade. E-cadherin is pending and will be reported in an addendum. Dr. Luisa Hart has reviewed the case. The case was called to Dr. Juanetta Gosling on 03/21/2021.  E-cadherin is positive in the areas of in situ carcinoma and thus, these areas are consistent with high grade ductal carcinoma in situ. E-cadherin is negative in areas of atypical lobular hyperplasia.   04/16/2021 Cancer Staging   Staging form: Breast, AJCC 8th Edition - Pathologic stage from 04/16/2021: Stage Unknown (pT1a, pNX, G1, ER+, PR-, HER2+) - Signed by Malachy Mood, MD on 05/06/2021 Stage prefix: Initial diagnosis Multigene prognostic tests performed: None Histologic grading system: 3 grade system Residual tumor (R): R0 - None   04/16/2021 Surgery   RIGHT BREAST LUMPECTOMY WITH RADIOACTIVE SEED LOCALIZATION by Dr Dwain Sarna    FINAL MICROSCOPIC DIAGNOSIS:   A. 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   All controls stained appropriately.    05/06/2021 Initial Diagnosis   Malignant neoplasm of lower-outer quadrant of right breast of female, estrogen receptor  positive (HCC)   05/24/2021 - 06/28/2021 Radiation Therapy   Whole breast radiation by Dr. Carmina Miller    07/2021 -  Anti-estrogen oral therapy   Adjuvant Anastrozole   10/30/2021 Survivorship   SCP  delivered by Santiago Glad, NP      CURRENT THERAPY: Anastrozole, starting 07/2021, held 9/23 for joint pain which did not improve with holding AI - restarted 01/2023    INTERVAL HISTORY Ms. Crites returns for follow up as scheduled. Last seen by me 08/14/23.  She called in January with increased right breast pain at rest and regardless of supporting bra, with associated firmness but not a discrete lump.  This appointment was canceled for snow but the pain has persisted and so she scheduled an appointment for today.  Denies nipple discharge, skin redness/warmth, fever or chills, or palpable mass.  She is tolerating anastrozole.  She has had some discomfort in the right flank for a few weeks. Denies urinary issues, hematuria, heavy lifting or stretching. She did urine and labs for a PCP visit this week at [facility].  ROS  All other systems reviewed and negative  Past Medical History:  Diagnosis Date   Arthritis    shoulders and lower back per patient   Headache    Heart murmur    Hypertension    Thyroid disease      Past Surgical History:  Procedure Laterality Date   BREAST EXCISIONAL BIOPSY Left    BREAST LUMPECTOMY WITH RADIOACTIVE SEED LOCALIZATION Right 04/16/2021   Procedure: RIGHT BREAST LUMPECTOMY WITH RADIOACTIVE SEED LOCALIZATION;  Surgeon: Emelia Loron, MD;  Location: [facility] OR;  Service: General;  Laterality: Right;   CARPAL TUNNEL RELEASE Right    PILONIDAL CYST EXCISION     THYROID CYST EXCISION Right    TONSILLECTOMY     removed as a child     Outpatient Encounter Medications as of 12/29/2023  Medication Sig   acetaminophen (TYLENOL) 325 MG tablet Take 650 mg by mouth every 6 (six) hours as needed for moderate pain.   albuterol (VENTOLIN HFA) 108 (90 Base) MCG/ACT inhaler Inhale 1-2 puffs into the lungs as needed.   anastrozole (ARIMIDEX) 1 MG tablet TAKE 1 TABLET(1 MG) BY MOUTH DAILY   atorvastatin (LIPITOR) 20 MG tablet TAKE 1 TABLET(20 MG) BY MOUTH DAILY    Cholecalciferol 100 MCG (4000 UT) CAPS Take 1 capsule by mouth daily.   hydrochlorothiazide (MICROZIDE) 12.5 MG capsule TAKE 1 CAPSULE(12.5 MG) BY MOUTH DAILY   ibuprofen (ADVIL) 200 MG tablet Take 200 mg by mouth as needed.   meloxicam (MOBIC) 7.5 MG tablet TAKE 1 TO 2 TABLETS(7.5 TO 15 MG) BY MOUTH DAILY (Patient taking differently: Take 7.5 mg by mouth as needed.)   Multiple Vitamins-Minerals (HAIR FORMULA EXTRA STRENGTH PO) Take 1 tablet by mouth daily.   Omega-3 1000 MG CAPS Take 1,000 mg by mouth daily.   OVER THE COUNTER MEDICATION Apply 1 application topically at bedtime as needed (pain). Magnesium Cream   telmisartan-hydrochlorothiazide (MICARDIS HCT) 80-12.5 MG tablet Take 1 tablet by mouth daily.   valACYclovir (VALTREX) 500 MG tablet TAKE 2 TABLETS(1000 MG) BY MOUTH TWICE DAILY (Patient taking differently: Take 500 mg by mouth as needed (outbreaks).)   omega-3 acid ethyl esters (LOVAZA) 1 g capsule Take 2 g by mouth daily.   No facility-administered encounter medications on file as of 12/29/2023.     Today's Vitals   12/29/23 1118  BP: 128/81  Pulse: 72  Resp: 17  Temp: 97.7  F (36.5 C)  TempSrc: Temporal  SpO2: 98%  Weight: 155 lb 1.6 oz (70.4 kg)   Body mass index is 29.31 kg/m.   PHYSICAL EXAM GENERAL:alert, no distress and comfortable SKIN: no rash  EYES: sclera clear NECK: without mass LYMPH:  no palpable cervical or supraclavicular lymphadenopathy  LUNGS:  normal breathing effort HEART:  no lower extremity edema ABDOMEN: abdomen soft, non-tender and normal bowel sounds. Mild ttp in the right flank area and low posterior ribs NEURO: alert & oriented x 3 with fluent speech, no focal motor/sensory deficits Breast exam: no nipple discharge or inversion. S/p R lumpectomy, lateral areolar incision completely healed. TTP at the medial areola and upper inner quadrant 1:00 without erythema, warmth, edema, or discrete mass. Left breast and b/l axilla benign.     CBC     Component Value Date/Time   WBC 3.6 (L) 12/29/2023 1043   WBC 5.7 06/21/2021 1024   RBC 4.42 12/29/2023 1043   HGB 13.2 12/29/2023 1043   HCT 39.1 12/29/2023 1043   PLT 181 12/29/2023 1043   MCV 88.5 12/29/2023 1043   MCH 29.9 12/29/2023 1043   MCHC 33.8 12/29/2023 1043   RDW 12.3 12/29/2023 1043   LYMPHSABS 1.1 12/29/2023 1043   MONOABS 0.3 12/29/2023 1043   EOSABS 0.1 12/29/2023 1043   BASOSABS 0.0 12/29/2023 1043     CMP     Component Value Date/Time   NA 142 12/29/2023 1043   NA 142 10/31/2023 1211   K 4.0 12/29/2023 1043   CL 106 12/29/2023 1043   CO2 30 12/29/2023 1043   GLUCOSE 89 12/29/2023 1043   BUN 16 12/29/2023 1043   BUN 21 10/31/2023 1211   CREATININE 0.59 12/29/2023 1043   CALCIUM 10.1 12/29/2023 1043   PROT 6.8 12/29/2023 1043   ALBUMIN 4.4 12/29/2023 1043   AST 22 12/29/2023 1043   ALT 14 12/29/2023 1043   ALKPHOS 78 12/29/2023 1043   BILITOT 0.9 12/29/2023 1043   GFRNONAA >60 12/29/2023 1043   GFRAA  10/29/2010 1040    >60        The eGFR has been calculated using the MDRD equation. This calculation has not been validated in all clinical situations. eGFR's persistently <60 mL/min signify possible Chronic Kidney Disease.     ASSESSMENT & PLAN:Mckinzey Bare is a 78 y.o. female with    Malignant neoplasm of lower-outer quadrant of right breast of female, estrogen receptor positive, stage I, p(T1aN0M0), ER+/PR-/HER2+, Grade I  -diagnosed with ALH in right breast on 08/16/20 and high grade DCIS and ALH in 2022.  -s/p right lumpectomy on 04/16/21 with Dr Dwain Sarna, pathology showed a 0.15cm grade I invasive ductal carcinoma and components of high grade DCIS. -s/p radiation therapy 05/28/21 - 06/28/21 -she started anastrozole in 07/2021, held in 07/2022 due to her left knee pain. She restarted anastrozole in 01/2023 since her knee pain did not improved with holding anastrozole, she previously declined tamoxifen  -Tolerating anastrozole with weight gain  and hair thinning/loss.  -Ms. Oplinger presents for symptom management visit for right breast pain. Exam is benign, no evidence of cellulitis or lymphedema. No palpable mass. I recommend further work up with R diagnostic mammo/US -She will continue AI and surveillance if work up is negative.  -She will be due annual bilateral mammo in June -F/up in 06/2024, or sooner if needed   Osteopenia after menopause -DEXA on 05/08/21 showed osteopenia (-2.1 in L1-4) -she reports she has tried alendronate but experienced increasing bone  pain. She endorses taking vit D. -she previously declined any kind of bone medication  -She recently fell, no fractures    PLAN: -Labs reviewed -Diagnostic R mammo/US for worsening R breast pain, will call with results -Continue Anastrozole -If work up is negative, f/up in 6 months    Orders Placed This Encounter  Procedures   MM DIAG BREAST TOMO UNI RIGHT    Standing Status:   Future    Expected Date:   01/05/2024    Expiration Date:   12/28/2024    Scheduling Instructions:     [facility]    Reason for Exam (SYMPTOM  OR DIAGNOSIS REQUIRED):   h/o R breast cancer, worsening breast pain, ttp medial areola and UIQ 1:00    Preferred imaging location?:   External   [place] LIMITED ULTRASOUND INCLUDING AXILLA RIGHT BREAST    Standing Status:   Future    Expected Date:   01/05/2024    Expiration Date:   12/28/2024    Scheduling Instructions:     [facility]    Reason for Exam (SYMPTOM  OR DIAGNOSIS REQUIRED):   h/o R breast cancer, worsening breast pain, ttp medial areola and UIQ 1:00    Preferred Imaging Location?:   External      All questions were answered. The patient knows to call the clinic with any problems, questions or concerns. No barriers to learning were detected. I spent 20 minutes counseling the patient face to face. The total time spent in the appointment was 30 minutes and more than 50% was on counseling, review of test results, and coordination of care.   Santiago Glad, NP-C 12/29/2023

## 2023-12-26 DIAGNOSIS — E785 Hyperlipidemia, unspecified: Secondary | ICD-10-CM | POA: Diagnosis not present

## 2023-12-26 DIAGNOSIS — Z0000 Encounter for general adult medical examination without abnormal findings: Secondary | ICD-10-CM | POA: Diagnosis not present

## 2023-12-26 DIAGNOSIS — E559 Vitamin D deficiency, unspecified: Secondary | ICD-10-CM | POA: Diagnosis not present

## 2023-12-26 DIAGNOSIS — M810 Age-related osteoporosis without current pathological fracture: Secondary | ICD-10-CM | POA: Diagnosis not present

## 2023-12-26 DIAGNOSIS — N182 Chronic kidney disease, stage 2 (mild): Secondary | ICD-10-CM | POA: Diagnosis not present

## 2023-12-26 DIAGNOSIS — E7800 Pure hypercholesterolemia, unspecified: Secondary | ICD-10-CM | POA: Diagnosis not present

## 2023-12-26 DIAGNOSIS — I129 Hypertensive chronic kidney disease with stage 1 through stage 4 chronic kidney disease, or unspecified chronic kidney disease: Secondary | ICD-10-CM | POA: Diagnosis not present

## 2023-12-29 ENCOUNTER — Encounter: Payer: Self-pay | Admitting: Nurse Practitioner

## 2023-12-29 ENCOUNTER — Telehealth: Payer: Self-pay

## 2023-12-29 ENCOUNTER — Inpatient Hospital Stay: Payer: Medicare Other | Attending: Nurse Practitioner

## 2023-12-29 ENCOUNTER — Inpatient Hospital Stay (HOSPITAL_BASED_OUTPATIENT_CLINIC_OR_DEPARTMENT_OTHER): Payer: Medicare Other | Admitting: Nurse Practitioner

## 2023-12-29 VITALS — BP 128/81 | HR 72 | Temp 97.7°F | Resp 17 | Wt 155.1 lb

## 2023-12-29 DIAGNOSIS — Z170 Estrogen receptor positive status [ER+]: Secondary | ICD-10-CM | POA: Diagnosis not present

## 2023-12-29 DIAGNOSIS — Z1722 Progesterone receptor negative status: Secondary | ICD-10-CM | POA: Diagnosis not present

## 2023-12-29 DIAGNOSIS — Z79811 Long term (current) use of aromatase inhibitors: Secondary | ICD-10-CM | POA: Insufficient documentation

## 2023-12-29 DIAGNOSIS — M8588 Other specified disorders of bone density and structure, other site: Secondary | ICD-10-CM | POA: Diagnosis not present

## 2023-12-29 DIAGNOSIS — N644 Mastodynia: Secondary | ICD-10-CM | POA: Diagnosis not present

## 2023-12-29 DIAGNOSIS — Z923 Personal history of irradiation: Secondary | ICD-10-CM | POA: Insufficient documentation

## 2023-12-29 DIAGNOSIS — Z1731 Human epidermal growth factor receptor 2 positive status: Secondary | ICD-10-CM | POA: Diagnosis not present

## 2023-12-29 DIAGNOSIS — C50511 Malignant neoplasm of lower-outer quadrant of right female breast: Secondary | ICD-10-CM

## 2023-12-29 LAB — CBC WITH DIFFERENTIAL (CANCER CENTER ONLY)
Abs Immature Granulocytes: 0.01 10*3/uL (ref 0.00–0.07)
Basophils Absolute: 0 10*3/uL (ref 0.0–0.1)
Basophils Relative: 1 %
Eosinophils Absolute: 0.1 10*3/uL (ref 0.0–0.5)
Eosinophils Relative: 1 %
HCT: 39.1 % (ref 36.0–46.0)
Hemoglobin: 13.2 g/dL (ref 12.0–15.0)
Immature Granulocytes: 0 %
Lymphocytes Relative: 32 %
Lymphs Abs: 1.1 10*3/uL (ref 0.7–4.0)
MCH: 29.9 pg (ref 26.0–34.0)
MCHC: 33.8 g/dL (ref 30.0–36.0)
MCV: 88.5 fL (ref 80.0–100.0)
Monocytes Absolute: 0.3 10*3/uL (ref 0.1–1.0)
Monocytes Relative: 8 %
Neutro Abs: 2.1 10*3/uL (ref 1.7–7.7)
Neutrophils Relative %: 58 %
Platelet Count: 181 10*3/uL (ref 150–400)
RBC: 4.42 MIL/uL (ref 3.87–5.11)
RDW: 12.3 % (ref 11.5–15.5)
WBC Count: 3.6 10*3/uL — ABNORMAL LOW (ref 4.0–10.5)
nRBC: 0 % (ref 0.0–0.2)

## 2023-12-29 LAB — CMP (CANCER CENTER ONLY)
ALT: 14 U/L (ref 0–44)
AST: 22 U/L (ref 15–41)
Albumin: 4.4 g/dL (ref 3.5–5.0)
Alkaline Phosphatase: 78 U/L (ref 38–126)
Anion gap: 6 (ref 5–15)
BUN: 16 mg/dL (ref 8–23)
CO2: 30 mmol/L (ref 22–32)
Calcium: 10.1 mg/dL (ref 8.9–10.3)
Chloride: 106 mmol/L (ref 98–111)
Creatinine: 0.59 mg/dL (ref 0.44–1.00)
GFR, Estimated: >60 mL/min (ref >60)
Glucose, Bld: 89 mg/dL (ref 70–99)
Potassium: 4 mmol/L (ref 3.5–5.1)
Sodium: 142 mmol/L (ref 135–145)
Total Bilirubin: 0.9 mg/dL (ref 0.0–1.2)
Total Protein: 6.8 g/dL (ref 6.5–8.1)

## 2023-12-29 NOTE — Telephone Encounter (Signed)
 Myriam Jacobson could you please help to schedule Diag R mammo/US at [facility] in next 1-2 weeks for worsening R breast pain and h/o R breast cancer. orders in epic.   Orders faxed and confirmation received.  [facility] to call pt with appt date and time

## 2024-01-07 DIAGNOSIS — N644 Mastodynia: Secondary | ICD-10-CM | POA: Diagnosis not present

## 2024-01-13 ENCOUNTER — Encounter: Payer: Self-pay | Admitting: Nurse Practitioner

## 2024-01-13 DIAGNOSIS — Z0000 Encounter for general adult medical examination without abnormal findings: Secondary | ICD-10-CM | POA: Diagnosis not present

## 2024-01-13 DIAGNOSIS — D0592 Unspecified type of carcinoma in situ of left breast: Secondary | ICD-10-CM | POA: Diagnosis not present

## 2024-01-13 DIAGNOSIS — E785 Hyperlipidemia, unspecified: Secondary | ICD-10-CM | POA: Diagnosis not present

## 2024-01-13 DIAGNOSIS — E559 Vitamin D deficiency, unspecified: Secondary | ICD-10-CM | POA: Diagnosis not present

## 2024-01-13 DIAGNOSIS — I10 Essential (primary) hypertension: Secondary | ICD-10-CM | POA: Diagnosis not present

## 2024-01-13 DIAGNOSIS — E041 Nontoxic single thyroid nodule: Secondary | ICD-10-CM | POA: Diagnosis not present

## 2024-01-13 DIAGNOSIS — M810 Age-related osteoporosis without current pathological fracture: Secondary | ICD-10-CM | POA: Diagnosis not present

## 2024-01-29 ENCOUNTER — Encounter: Payer: Self-pay | Admitting: Rheumatology

## 2024-02-04 ENCOUNTER — Encounter: Payer: Self-pay | Admitting: Nurse Practitioner

## 2024-02-11 ENCOUNTER — Encounter: Payer: Self-pay | Admitting: Nurse Practitioner

## 2024-02-12 ENCOUNTER — Ambulatory Visit: Payer: No Typology Code available for payment source | Admitting: Nurse Practitioner

## 2024-02-12 ENCOUNTER — Other Ambulatory Visit: Payer: Self-pay

## 2024-02-12 ENCOUNTER — Other Ambulatory Visit: Payer: No Typology Code available for payment source

## 2024-02-12 ENCOUNTER — Ambulatory Visit: Payer: Self-pay | Admitting: Nurse Practitioner

## 2024-02-17 ENCOUNTER — Encounter: Payer: Self-pay | Admitting: Nurse Practitioner

## 2024-05-04 DIAGNOSIS — Z1231 Encounter for screening mammogram for malignant neoplasm of breast: Secondary | ICD-10-CM | POA: Diagnosis not present

## 2024-05-12 ENCOUNTER — Encounter: Payer: Self-pay | Admitting: Nurse Practitioner

## 2024-06-09 ENCOUNTER — Ambulatory Visit: Attending: Cardiology

## 2024-06-09 ENCOUNTER — Telehealth: Payer: Self-pay | Admitting: Cardiology

## 2024-06-09 ENCOUNTER — Other Ambulatory Visit: Payer: Self-pay | Admitting: Cardiology

## 2024-06-09 DIAGNOSIS — R000 Tachycardia, unspecified: Secondary | ICD-10-CM

## 2024-06-09 DIAGNOSIS — R002 Palpitations: Secondary | ICD-10-CM

## 2024-06-09 NOTE — Telephone Encounter (Signed)
 Spoke to patient Dr.Ganji advised 2 week heart monitor (zio).Monitor will be mailed to your home with instructions.Follow up appointment rescheduled with Dr.Ganji 9/2 at 2:20 pm.

## 2024-06-09 NOTE — Telephone Encounter (Signed)
 Received a call back from patient.She stated she has had 2 episodes of fast heart beat with rates around 116.Each episode lasting less than 10 min. Stated she felt jittery,no chest pain.She requested appointment with Dr.Ganji.Appointment scheduled with Dr.Ganji 8/12 at 3:20 pm at [facility]. I will make Dr.Ganji aware.

## 2024-06-09 NOTE — Addendum Note (Signed)
 Addended by: CHRISTIANNE CHANNING PARAS on: 06/09/2024 03:45 PM   Modules accepted: Orders

## 2024-06-09 NOTE — Telephone Encounter (Signed)
 I would do Zio not live for 2 weeks and see her in 3 weeks to know what is the appropriate management.

## 2024-06-09 NOTE — Telephone Encounter (Signed)
 Patient returned RN's call regarding HR issue.

## 2024-06-09 NOTE — Progress Notes (Unsigned)
 Enrolled for [employer] to mail a ZIO XT long term holter monitor to the patients address on file.

## 2024-06-09 NOTE — Telephone Encounter (Signed)
 STAT if HR is under 50 or over 120 (normal HR is 60-100 beats per minute)  What is your heart rate? 116 resting HR and BP 114/100 this morning   Do you have a log of your heart rate readings (document readings)? 2 weeks ago 120 with BP 90/84  Do you have any other symptoms? Feeling fluttery, denied sob, cp perspirations, feeling hypoglycemics and coughing       The patient reported having two episodes of tachycardia - one two weeks ago and another this morning. She described a fluttery sensation, similar to the feeling she experiences with hypoglycemic symptoms.

## 2024-06-09 NOTE — Telephone Encounter (Signed)
Returned call to patient left message to call back. 

## 2024-06-21 ENCOUNTER — Ambulatory Visit: Admitting: Cardiology

## 2024-06-22 ENCOUNTER — Ambulatory Visit: Admitting: Cardiology

## 2024-06-28 ENCOUNTER — Other Ambulatory Visit: Payer: Self-pay

## 2024-06-28 DIAGNOSIS — C50511 Malignant neoplasm of lower-outer quadrant of right female breast: Secondary | ICD-10-CM

## 2024-06-28 DIAGNOSIS — R002 Palpitations: Secondary | ICD-10-CM | POA: Diagnosis not present

## 2024-06-28 DIAGNOSIS — R000 Tachycardia, unspecified: Secondary | ICD-10-CM | POA: Diagnosis not present

## 2024-06-29 ENCOUNTER — Inpatient Hospital Stay (HOSPITAL_BASED_OUTPATIENT_CLINIC_OR_DEPARTMENT_OTHER): Payer: Medicare Other | Admitting: Nurse Practitioner

## 2024-06-29 ENCOUNTER — Inpatient Hospital Stay: Payer: Medicare Other | Attending: Nurse Practitioner

## 2024-06-29 ENCOUNTER — Ambulatory Visit: Payer: Self-pay | Admitting: Cardiology

## 2024-06-29 ENCOUNTER — Encounter: Payer: Self-pay | Admitting: Nurse Practitioner

## 2024-06-29 VITALS — BP 110/68 | HR 71 | Temp 97.2°F | Resp 17 | Ht 61.0 in | Wt 152.5 lb

## 2024-06-29 DIAGNOSIS — Z1731 Human epidermal growth factor receptor 2 positive status: Secondary | ICD-10-CM | POA: Diagnosis not present

## 2024-06-29 DIAGNOSIS — Z79811 Long term (current) use of aromatase inhibitors: Secondary | ICD-10-CM | POA: Diagnosis not present

## 2024-06-29 DIAGNOSIS — Z923 Personal history of irradiation: Secondary | ICD-10-CM | POA: Diagnosis not present

## 2024-06-29 DIAGNOSIS — R002 Palpitations: Secondary | ICD-10-CM | POA: Diagnosis not present

## 2024-06-29 DIAGNOSIS — C50511 Malignant neoplasm of lower-outer quadrant of right female breast: Secondary | ICD-10-CM | POA: Diagnosis not present

## 2024-06-29 DIAGNOSIS — M8588 Other specified disorders of bone density and structure, other site: Secondary | ICD-10-CM | POA: Insufficient documentation

## 2024-06-29 DIAGNOSIS — R000 Tachycardia, unspecified: Secondary | ICD-10-CM | POA: Diagnosis not present

## 2024-06-29 DIAGNOSIS — Z1722 Progesterone receptor negative status: Secondary | ICD-10-CM | POA: Insufficient documentation

## 2024-06-29 DIAGNOSIS — Z170 Estrogen receptor positive status [ER+]: Secondary | ICD-10-CM

## 2024-06-29 LAB — CBC WITH DIFFERENTIAL (CANCER CENTER ONLY)
Abs Immature Granulocytes: 0.01 K/uL (ref 0.00–0.07)
Basophils Absolute: 0 K/uL (ref 0.0–0.1)
Basophils Relative: 1 %
Eosinophils Absolute: 0 K/uL (ref 0.0–0.5)
Eosinophils Relative: 1 %
HCT: 40 % (ref 36.0–46.0)
Hemoglobin: 13.9 g/dL (ref 12.0–15.0)
Immature Granulocytes: 0 %
Lymphocytes Relative: 25 %
Lymphs Abs: 0.9 K/uL (ref 0.7–4.0)
MCH: 30.7 pg (ref 26.0–34.0)
MCHC: 34.8 g/dL (ref 30.0–36.0)
MCV: 88.3 fL (ref 80.0–100.0)
Monocytes Absolute: 0.3 K/uL (ref 0.1–1.0)
Monocytes Relative: 8 %
Neutro Abs: 2.4 K/uL (ref 1.7–7.7)
Neutrophils Relative %: 65 %
Platelet Count: ADEQUATE K/uL (ref 150–400)
RBC: 4.53 MIL/uL (ref 3.87–5.11)
RDW: 12.3 % (ref 11.5–15.5)
WBC Count: 3.7 K/uL — ABNORMAL LOW (ref 4.0–10.5)
nRBC: 0 % (ref 0.0–0.2)

## 2024-06-29 LAB — CMP (CANCER CENTER ONLY)
ALT: 13 U/L (ref 0–44)
AST: 20 U/L (ref 15–41)
Albumin: 4.5 g/dL (ref 3.5–5.0)
Alkaline Phosphatase: 89 U/L (ref 38–126)
Anion gap: 7 (ref 5–15)
BUN: 17 mg/dL (ref 8–23)
CO2: 30 mmol/L (ref 22–32)
Calcium: 9.9 mg/dL (ref 8.9–10.3)
Chloride: 104 mmol/L (ref 98–111)
Creatinine: 0.67 mg/dL (ref 0.44–1.00)
GFR, Estimated: >60 mL/min (ref >60)
Glucose, Bld: 99 mg/dL (ref 70–99)
Potassium: 3.8 mmol/L (ref 3.5–5.1)
Sodium: 141 mmol/L (ref 135–145)
Total Bilirubin: 0.9 mg/dL (ref 0.0–1.2)
Total Protein: 7 g/dL (ref 6.5–8.1)

## 2024-06-29 MED ORDER — ANASTROZOLE 1 MG PO TABS: ORAL_TABLET | ORAL | 3 refills | Status: AC

## 2024-06-29 NOTE — Progress Notes (Signed)
 [facility]   Telephone:([phone] Fax:(336) [phone]    Patient Care Team: Royden Ronal Czar, FNP as PCP - General (Internal Medicine) Ladona Heinz, MD as PCP - Cardiology (Cardiology) Leni Marjory MATSU, MD (Rheumatology) Tyree Nanetta SAILOR, RN as Oncology Nurse Navigator Glean, Stephane BROCKS, RN (Inactive) as Oncology Nurse Navigator Ebbie Cough, MD as Consulting Physician (General Surgery) Ann Mayme POUR, NP as Nurse Practitioner (Nurse Practitioner) Lanny Callander, MD as Consulting Physician (Hematology)   CHIEF COMPLAINT: Follow-up right breast cancer  Oncology History Overview Note  Cancer Staging Malignant neoplasm of lower-outer quadrant of right breast of female, estrogen receptor positive (HCC) Staging form: Breast, AJCC 8th Edition - Pathologic stage from 04/16/2021: Stage Unknown (pT1a, pNX, G1, ER+, PR-, HER2+) - Signed by Lanny Callander, MD on 05/06/2021 Stage prefix: Initial diagnosis Multigene prognostic tests performed: None Histologic grading system: 3 grade system Residual tumor (R): R0 - None    Malignant neoplasm of lower-outer quadrant of right breast of female, estrogen receptor positive (HCC)  08/16/2020 Pathology Results   Diagnosis Breast, right, needle core biopsy - LOBULAR NEOPLASIA (ATYPICAL LOBULAR HYPERPLASIA) WITH MICROCALCIFICATIONS - SEE COMMENT Microscopic Comment These results were called to [facility] on August 17, 2020.   03/13/2021 Mammogram   Right mammogram 03/13/21 There are pleomorphic calcifications spanning 1.6cm in the slightly linear configuration within the right lower outer breast anterior to middle depth 2.7cm from the nipple. These are increased in number. No other significant masses or calcifications.    Left Mammogram on 03/20/21  Negative    03/20/2021 Initial Biopsy   Diagnosis Breast, right, needle core biopsy, lower outer, 2.7cmfn, anterior depth - MAMMARY CARCINOMA IN SITU WITH CALCIFICATIONS AND  NECROSIS. - LOBULAR NEOPLASIA (ATYPICAL LOBULAR HYPERPLASIA). Microscopic Comment The carcinoma appears high grade. E-cadherin is pending and will be reported in an addendum. Dr. Belvie has reviewed the case. The case was called to Dr. Vonzell on 03/21/2021.  E-cadherin is positive in the areas of in situ carcinoma and thus, these areas are consistent with high grade ductal carcinoma in situ. E-cadherin is negative in areas of atypical lobular hyperplasia.   04/16/2021 Cancer Staging   Staging form: Breast, AJCC 8th Edition - Pathologic stage from 04/16/2021: Stage Unknown (pT1a, pNX, G1, ER+, PR-, HER2+) - Signed by Lanny Callander, MD on 05/06/2021 Stage prefix: Initial diagnosis Multigene prognostic tests performed: None Histologic grading system: 3 grade system Residual tumor (R): R0 - None   04/16/2021 Surgery   RIGHT BREAST LUMPECTOMY WITH RADIOACTIVE SEED LOCALIZATION by Dr Ebbie    FINAL MICROSCOPIC DIAGNOSIS:   A. 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   All controls stained appropriately.    05/06/2021 Initial Diagnosis  Malignant neoplasm of lower-outer quadrant of right breast of female, estrogen receptor positive (HCC)   05/24/2021 - 06/28/2021 Radiation Therapy   Whole breast radiation by Dr. Marcey Penton    07/2021 -   Anti-estrogen oral therapy   Adjuvant Anastrozole    10/30/2021 Survivorship   SCP delivered by Marquerite Forsman, NP      CURRENT THERAPY: Anastrozole , starting 07/2021, held 9/23 for joint pain which did not improve with holding AI - restarted 01/2023    INTERVAL HISTORY Ms. Sons returns for follow-up as scheduled, last seen by me 12/29/2023 for right breast pain, diag right mammo/US  2/26 were benign.  Routine bilateral mammogram 05/04/2024 was also benign.  DEXA 01/13/2024 showed osteoporosis and high FRAX score of the hip.  She had bone pain with alendronate in the past.  Denies recent fall.  Tolerating anastrozole  with some carpal tunnel pain flare which she manages with massage and exercise.  Knee pain is doing well.  Denies breast concerns or changes such as new lump/mass, nipple discharge or inversion, or skin change.  ROS  All other systems reviewed and negative  Past Medical History:  Diagnosis Date   Arthritis    shoulders and lower back per patient   Headache    Heart murmur    Hypertension    Thyroid  disease      Past Surgical History:  Procedure Laterality Date   BREAST EXCISIONAL BIOPSY Left    BREAST LUMPECTOMY WITH RADIOACTIVE SEED LOCALIZATION Right 04/16/2021   Procedure: RIGHT BREAST LUMPECTOMY WITH RADIOACTIVE SEED LOCALIZATION;  Surgeon: Ebbie Cough, MD;  Location: [facility] OR;  Service: General;  Laterality: Right;   CARPAL TUNNEL RELEASE Right    PILONIDAL CYST EXCISION     THYROID  CYST EXCISION Right    TONSILLECTOMY     removed as a child     Outpatient Encounter Medications as of 06/29/2024  Medication Sig   acetaminophen  (TYLENOL ) 325 MG tablet Take 650 mg by mouth every 6 (six) hours as needed for moderate pain.   albuterol (VENTOLIN HFA) 108 (90 Base) MCG/ACT inhaler Inhale 1-2 puffs into the lungs as needed.   anastrozole  (ARIMIDEX ) 1 MG tablet TAKE 1 TABLET(1 MG) BY MOUTH DAILY   atorvastatin  (LIPITOR) 20 MG tablet TAKE 1 TABLET(20 MG) BY MOUTH DAILY    Cholecalciferol 100 MCG (4000 UT) CAPS Take 1 capsule by mouth daily.   hydrochlorothiazide  (MICROZIDE ) 12.5 MG capsule TAKE 1 CAPSULE(12.5 MG) BY MOUTH DAILY   ibuprofen (ADVIL) 200 MG tablet Take 200 mg by mouth as needed.   meloxicam  (MOBIC ) 7.5 MG tablet TAKE 1 TO 2 TABLETS(7.5 TO 15 MG) BY MOUTH DAILY (Patient taking differently: Take 7.5 mg by mouth as needed.)   Multiple Vitamins-Minerals (HAIR FORMULA EXTRA STRENGTH PO) Take 1 tablet by mouth daily.   Omega-3 1000 MG CAPS Take 1,000 mg by mouth daily.   omega-3 acid ethyl esters (LOVAZA) 1 g capsule Take 2 g by mouth daily.   OVER THE COUNTER MEDICATION Apply 1 application topically at bedtime as needed (pain). Magnesium Cream   telmisartan-hydrochlorothiazide  (MICARDIS HCT) 80-12.5 MG tablet Take 1 tablet by mouth daily.   valACYclovir  (VALTREX ) 500 MG tablet TAKE 2 TABLETS(1000 MG) BY MOUTH TWICE DAILY (Patient taking differently: Take 500 mg by mouth as needed (outbreaks).)   No facility-administered encounter medications on file as of 06/29/2024.     Today's Vitals   06/29/24 1155 06/29/24 1212  BP: 110/68   Pulse: 71   Resp: 17   Temp: (!)  97.2 F (36.2 C)   SpO2: 97%   Weight: 152 lb 8 oz (69.2 kg)   Height: 5' 1 (1.549 m)   PainSc: 0-No pain 0-No pain   Body mass index is 28.81 kg/m.   ECOG PERFORMANCE STATUS: 0 - Asymptomatic  PHYSICAL EXAM GENERAL:alert, no distress and comfortable SKIN: no rash  EYES: sclera clear NECK: without mass LYMPH:  no palpable cervical or supraclavicular lymphadenopathy  LUNGS: clear with normal breathing effort HEART: regular rate & rhythm, no lower extremity edema ABDOMEN: abdomen soft, non-tender and normal bowel sounds NEURO: alert & oriented x 3 with fluent speech, no focal motor/sensory deficits Breast exam: S/p right lumpectomy, incisions completely healed.  No palpable mass or nodularity in either breast or axilla that I could appreciate.   CBC    Latest Ref Rng &  Units 06/29/2024   11:34 AM 12/29/2023   10:43 AM 08/14/2023   10:47 AM  CBC  WBC 4.0 - 10.5 K/uL 3.7  3.6  3.6   Hemoglobin 12.0 - 15.0 g/dL 86.0  86.7  86.8   Hematocrit 36.0 - 46.0 % 40.0  39.1  37.9   Platelets 150 - 400 K/uL PLATELET CLUMPS NOTED ON SMEAR, COUNT APPEARS ADEQUATE  181  165       CMP     Latest Ref Rng & Units 06/29/2024   11:34 AM 12/29/2023   10:43 AM 10/31/2023   12:11 PM  CMP  Glucose 70 - 99 mg/dL 99  89  91   BUN 8 - 23 mg/dL 17  16  21    Creatinine 0.44 - 1.00 mg/dL 9.32  9.40  9.31   Sodium 135 - 145 mmol/L 141  142  142   Potassium 3.5 - 5.1 mmol/L 3.8  4.0  3.8   Chloride 98 - 111 mmol/L 104  106  101   CO2 22 - 32 mmol/L 30  30  25    Calcium  8.9 - 10.3 mg/dL 9.9  89.8  89.5   Total Protein 6.5 - 8.1 g/dL 7.0  6.8    Total Bilirubin 0.0 - 1.2 mg/dL 0.9  0.9    Alkaline Phos 38 - 126 U/L 89  78    AST 15 - 41 U/L 20  22    ALT 0 - 44 U/L 13  14        ASSESSMENT & PLAN:Yamaris Bodin is a 78 y.o. female with    Malignant neoplasm of lower-outer quadrant of right breast of female, estrogen receptor positive, stage I, p(T1aN0M0), ER+/PR-/HER2+, Grade I  -diagnosed with ALH in right breast on 08/16/20 and high grade DCIS and ALH in 2022.  -s/p right lumpectomy on 04/16/21 with Dr Ebbie, pathology showed a 0.15cm grade I invasive ductal carcinoma and components of high grade DCIS. -s/p radiation therapy 05/28/21 - 06/28/21 -she started anastrozole  in 07/2021, held in 07/2022 due to her left knee pain. She restarted anastrozole  in 01/2023 since her knee pain did not improved with holding anastrozole , she previously declined tamoxifen  -Tolerating anastrozole  with weight gain and hair thinning/loss.  - Ms. Carvell is clinically doing well.  Exam is benign, labs are unremarkable, recent mammogram was benign.  Overall no clinical concern for recurrence. - Continue breast cancer surveillance and anastrozole , reviewed healthy active lifestyle practices and  symptom management. - Follow-up in 6 months, or sooner if needed  Osteopenia after menopause -DEXA on 05/08/21 showed osteopenia (-2.1 in L1-4) -Tried alendronate but experienced increasing bone pain.  -  Recent DEXA showed worsening osteoporosis lowest T-score -3.0, and 3% risk of hip fracture in the next 10 years.  We again reviewed bisphosphonates, she is fearful of potential side effects and prefers to avoid - Discussed changing to tamoxifen for bone strengthening quality, potential risk/benefit and side effects.  She declined - Patient prefers to continue anastrozole  with calcium  and vitamin D and weightbearing exercise     PLAN: - Recent mammogram, DEXA, and today's labs reviewed -Continue breast cancer surveillance and anastrozole , calcium /vitamin D and weightbearing exercise -Next surveillance visit in 6 months, or sooner if needed   All questions were answered. The patient knows to call the clinic with any problems, questions or concerns. No barriers to learning were detected. I spent 20 minutes counseling the patient face to face. The total time spent in the appointment was 30 minutes and more than 50% was on counseling, review of test results, and coordination of care.   Davionna Blacksher K Zaire Levesque, NP 06/29/2024

## 2024-06-29 NOTE — Progress Notes (Signed)
 No significant arrhythmias.  Her symptomatic events correlated with PACs.  PAC = Premature atrial complexes: These arise from the upper chamber of the heart.  These are very common and are not dangerous.  Extra skipped beat coming from the upper chamber (atrium) and mostly are life altering (nuisance) than life threatening and mostly treated by reassurance.  There may not be any specific reasons for this, however patients with excessive caffeine, anxiety, lack of sleep, alcohol or thyroid  problems can have these episodes.

## 2024-07-06 ENCOUNTER — Ambulatory Visit: Admitting: General Practice

## 2024-07-13 ENCOUNTER — Ambulatory Visit: Admitting: Cardiology

## 2024-07-15 DIAGNOSIS — E785 Hyperlipidemia, unspecified: Secondary | ICD-10-CM | POA: Diagnosis not present

## 2024-07-16 LAB — LAB REPORT - SCANNED: EGFR: 81

## 2024-07-22 DIAGNOSIS — I10 Essential (primary) hypertension: Secondary | ICD-10-CM | POA: Diagnosis not present

## 2024-07-22 DIAGNOSIS — E785 Hyperlipidemia, unspecified: Secondary | ICD-10-CM | POA: Diagnosis not present

## 2024-07-22 DIAGNOSIS — E559 Vitamin D deficiency, unspecified: Secondary | ICD-10-CM | POA: Diagnosis not present

## 2024-07-23 ENCOUNTER — Ambulatory Visit: Admitting: Cardiology

## 2024-08-19 ENCOUNTER — Ambulatory Visit
Admission: RE | Admit: 2024-08-19 | Discharge: 2024-08-19 | Disposition: A | Discharge status: Home / Self Care | Payer: No Typology Code available for payment source | Source: Ambulatory Visit | Attending: Radiation Oncology | Admitting: Radiation Oncology

## 2024-08-19 ENCOUNTER — Encounter: Payer: Self-pay | Admitting: Radiation Oncology

## 2024-08-19 VITALS — BP 135/85 | HR 78 | Temp 97.4°F | Resp 16 | Wt 153.0 lb

## 2024-08-19 DIAGNOSIS — C50511 Malignant neoplasm of lower-outer quadrant of right female breast: Secondary | ICD-10-CM | POA: Diagnosis not present

## 2024-08-19 DIAGNOSIS — Z79811 Long term (current) use of aromatase inhibitors: Secondary | ICD-10-CM | POA: Diagnosis not present

## 2024-08-19 DIAGNOSIS — Z170 Estrogen receptor positive status [ER+]: Secondary | ICD-10-CM | POA: Diagnosis not present

## 2024-08-19 DIAGNOSIS — Z923 Personal history of irradiation: Secondary | ICD-10-CM | POA: Insufficient documentation

## 2024-08-19 DIAGNOSIS — M8580 Other specified disorders of bone density and structure, unspecified site: Secondary | ICD-10-CM | POA: Insufficient documentation

## 2024-08-19 NOTE — Progress Notes (Signed)
 Radiation Oncology Follow up Note  Name: Tracy Horne   Date:   08/19/2024 MRN:  [id number] DOB: 1946-07-10    This 78 y.o. female presents to the clinic today for 3-year follow-up.  Status post whole breast radiation to her right breast for stage Ia ER positive invasive mammary carcinoma  REFERRING PROVIDER: Royden Ronal Czar, FNP  HPI: Patient is a 78 year old female now out 3 years having completed whole breast radiation to her right breast for stage Ia ER positive invasive mammary carcinoma.  She is seen today in routine follow-up is doing well.  Specifically denies breast tenderness cough or bone pain.SABRA  He does have some issues with osteopenia that is being addressed by her PMD.  Patient is currently on Arimidex  which she is tolerating well.  Patient had mammograms back in July which I have reviewed were BI-RADS 1 benign  COMPLICATIONS OF TREATMENT: none  FOLLOW UP COMPLIANCE: keeps appointments   PHYSICAL EXAM:  BP 135/85   Pulse 78   Temp (!) 97.4 F (36.3 C)   Resp 16   Wt 153 lb (69.4 kg)   BMI 28.91 kg/m  Lungs are clear to A&P cardiac examination essentially unremarkable with regular rate and rhythm. No dominant mass or nodularity is noted in either breast in 2 positions examined. Incision is well-healed. No axillary or supraclavicular adenopathy is appreciated. Cosmetic result is excellent.  Well-developed well-nourished patient in NAD. HEENT reveals PERLA, EOMI, discs not visualized.  Oral cavity is clear. No oral mucosal lesions are identified. Neck is clear without evidence of cervical or supraclavicular adenopathy. Lungs are clear to A&P. Cardiac examination is essentially unremarkable with regular rate and rhythm without murmur rub or thrill. Abdomen is benign with no organomegaly or masses noted. Motor sensory and DTR levels are equal and symmetric in the upper and lower extremities. Cranial nerves II through XII are grossly intact. Proprioception is intact. No  peripheral adenopathy or edema is identified. No motor or sensory levels are noted. Crude visual fields are within normal range.  RADIOLOGY RESULTS: Mammograms reviewed compatible with above-stated findings  PLAN: Present time patient is now at over 3 years with no evidence of disease.  And pleased with her overall progress.  She continues on Arimidex  without side effect.  I am going to turn follow-up care over to medical oncology.  Be happy to reevaluate the patient anytime should that be indicated.  Patient knows to call with any concerns.  I would like to take this opportunity to thank you for allowing me to participate in the care of your patient.SABRA Marcey Penton, MD

## 2024-08-25 ENCOUNTER — Encounter: Payer: Self-pay | Admitting: Cardiology

## 2024-08-25 ENCOUNTER — Ambulatory Visit: Attending: Cardiology | Admitting: Cardiology

## 2024-08-25 VITALS — BP 143/88 | HR 73 | Resp 16 | Ht 61.0 in | Wt 153.0 lb

## 2024-08-25 DIAGNOSIS — E7800 Pure hypercholesterolemia, unspecified: Secondary | ICD-10-CM | POA: Diagnosis not present

## 2024-08-25 DIAGNOSIS — I10 Essential (primary) hypertension: Secondary | ICD-10-CM | POA: Diagnosis not present

## 2024-08-25 DIAGNOSIS — I491 Atrial premature depolarization: Secondary | ICD-10-CM | POA: Insufficient documentation

## 2024-08-25 DIAGNOSIS — R931 Abnormal findings on diagnostic imaging of heart and coronary circulation: Secondary | ICD-10-CM | POA: Insufficient documentation

## 2024-08-25 DIAGNOSIS — R002 Palpitations: Secondary | ICD-10-CM | POA: Diagnosis not present

## 2024-08-25 MED ORDER — ATORVASTATIN CALCIUM 20 MG PO TABS: 20.0000 mg | ORAL_TABLET | Freq: Every day | ORAL | 0 refills | Status: DC

## 2024-08-25 MED ORDER — TELMISARTAN-HCTZ 80-25 MG PO TABS: 1.0000 | ORAL_TABLET | Freq: Every day | ORAL | 0 refills | Status: DC

## 2024-08-25 MED ORDER — METOPROLOL SUCCINATE ER 50 MG PO TB24: 50.0000 mg | ORAL_TABLET | Freq: Every evening | ORAL | 0 refills | Status: AC

## 2024-08-25 MED ORDER — VALACYCLOVIR HCL 500 MG PO TABS: 500.0000 mg | ORAL_TABLET | ORAL | Status: AC | PRN

## 2024-08-25 NOTE — Progress Notes (Signed)
 Cardiology Office Note:  .   Date:  08/25/2024  ID:  Tracy Horne, DOB September 28, 1946, MRN [id number] PCP: Royden Ronal Czar, FNP  [facility] Providers Cardiologist:  Gordy Bergamo, MD   History of Present Illness: .   Tracy Horne is a 78 y.o. fairly active retired Engineer, civil (consulting) with elevated coronary calcium  score of 338 in the MESA database percentile 85, palpitations and hypercholesterolemia evaluated clinic by Josefa Beauvais, NP for hypertension and leg edema, was started on telmisartan HCT, she now presents for follow-up of palpitations.  She wore an event monitor.  Her beta-blocker was discontinued.  I had seen her in 2013 and initiated beta-blocker therapy after she had complained of palpitations.  She made appointment to see me for both palpitations and wore an event monitor and for follow-up of hypertension.  Otherwise remains asymptomatic.  Cardiac Studies relevent.    Outpatient extended EKG monitoring 14 days starting 06/11/2024 for palpitations: Predominant rhythm was sinus rhythm.  There were 31 SVT episodes, longest 20 seconds at the rate of 142 bpm.  EKG reveals atrial tachycardia, brief.  No atrial fibrillation. There were frequent PACs (6.4%) and occasional atrial couplets and triplets. Occasional PVCs and rare ventricular couplets and triplets were present. There is no atrial fibrillation.  No high degree AV block. Patient activated event correlated with PACs     Echocardiogram 07/16/2022: Normal LV systolic function with visual EF 60-65%. Left ventricle cavity is normal in size. Normal global wall motion. Doppler evidence of grade I (impaired) diastolic dysfunction, normal LAP. Calculated EF 67%. Structurally normal mitral valve.  Mild (Grade I) mitral regurgitation. Structurally normal tricuspid valve.  Mild tricuspid regurgitation. No evidence of pulmonary hypertension.    Discussed the use of AI scribe software for clinical note transcription with the patient, who  gave verbal consent to proceed.  History of Present Illness Tracy Horne is a 78 year old female with hypertension and coronary artery disease who presents with heart racing symptoms.  She experiences episodes of rapid and pounding heartbeats lasting three to four minutes, occurring twice in the early morning after resting. Drinking water and eating cheese alleviate the symptoms. No persistent palpitations were noted during the monitor period, but she experiences heart racing and weakness.  Her current medications include telmisartan with hydrochlorothiazide  and atorvastatin . Amlodipine was discontinued due to leg swelling. Blood pressure readings have been elevated, ranging from 133/92 to 142/92, attributed to late medication intake and being busy. She acknowledges missing doses of atorvastatin , contributing to increased cholesterol levels.  Intermittent leg swelling, particularly in the left ankle, is associated with dietary intake and managed with support stockings. Weight has increased from 133 to 153 pounds over two to three years, attributed to dietary changes and anastrozole  for breast cancer treatment.  Labs   Lipoprotein (a) 06/24/2022 11:11 AM 44.3 <75.0 nmol/L Final    Recent Labs    10/31/23 1211 12/29/23 1043 06/29/24 1134  NA 142 142 141  K 3.8 4.0 3.8  CL 101 106 104  CO2 25 30 30   GLUCOSE 91 89 99  BUN 21 16 17   CREATININE 0.68 0.59 0.67  CALCIUM  10.4* 10.1 9.9  GFRNONAA  --  >60 >60    Lab Results  Component Value Date   ALT 13 06/29/2024   AST 20 06/29/2024   ALKPHOS 89 06/29/2024   BILITOT 0.9 06/29/2024      Latest Ref Rng & Units 06/29/2024   11:34 AM 12/29/2023   10:43 AM 08/14/2023  10:47 AM  CBC  WBC 4.0 - 10.5 K/uL 3.7  3.6  3.6   Hemoglobin 12.0 - 15.0 g/dL 86.0  86.7  86.8   Hematocrit 36.0 - 46.0 % 40.0  39.1  37.9   Platelets 150 - 400 K/uL PLATELET CLUMPS NOTED ON SMEAR, COUNT APPEARS ADEQUATE  181  165   Care everywhere/Faxed External  Labs:  Labs 07/22/2024:  Total cholesterol 208, triglycerides 69, HDL 82, LDL 114.  Non-HDL cholesterol 116.  TSH normal at 0.651.  ROS  Review of Systems  Cardiovascular:  Positive for palpitations. Negative for chest pain, dyspnea on exertion and leg swelling.   Physical Exam:   VS:  BP (!) 143/88 (BP Location: Left Arm, Patient Position: Sitting, Cuff Size: Normal)   Pulse 73   Resp 16   Ht 5' 1 (1.549 m)   Wt 153 lb (69.4 kg)   SpO2 98%   BMI 28.91 kg/m    Wt Readings from Last 3 Encounters:  08/25/24 153 lb (69.4 kg)  08/19/24 153 lb (69.4 kg)  06/29/24 152 lb 8 oz (69.2 kg)    BP Readings from Last 3 Encounters:  08/25/24 (!) 143/88  08/19/24 135/85  06/29/24 110/68   Physical Exam Constitutional:      Appearance: She is obese.  Neck:     Vascular: No carotid bruit or JVD.  Cardiovascular:     Rate and Rhythm: Normal rate and regular rhythm.     Pulses: Intact distal pulses.     Heart sounds: Normal heart sounds. No murmur heard.    No gallop.  Pulmonary:     Effort: Pulmonary effort is normal.     Breath sounds: Normal breath sounds.  Abdominal:     General: Bowel sounds are normal.     Palpations: Abdomen is soft.  Musculoskeletal:     Right lower leg: No edema.     Left lower leg: No edema.    EKG:    EKG Interpretation Date/Time:  Wednesday August 25 2024 10:46:54 EDT Ventricular Rate:  77 PR Interval:  158 QRS Duration:  78 QT Interval:  384 QTC Calculation: 434 R Axis:   -35  Text Interpretation: EKG 08/25/2024: Normal sinus rhythm at rate of 77 bpm, left anterior fascicular block.  No evidence of ischemia.  No change from 09/08/2023. Confirmed by Kaulana Brindle, Jagadeesh ([id number]) on 08/25/2024 6:37:01 PM    ASSESSMENT AND PLAN: .      ICD-10-CM   1. PAC (premature atrial contraction)  I49.1 EKG 12-Lead    2. Palpitations  R00.2 metoprolol  succinate (TOPROL -XL) 50 MG 24 hr tablet    3. Primary hypertension  I10 metoprolol  succinate  (TOPROL -XL) 50 MG 24 hr tablet    telmisartan-hydrochlorothiazide  (MICARDIS HCT) 80-25 MG tablet    4. Hypercholesteremia  E78.00 atorvastatin  (LIPITOR) 20 MG tablet    5. Elevated coronary artery calcium  score 01/11/2022: Total Agatston score 338.  MESA database percentile 85.  R93.1 atorvastatin  (LIPITOR) 20 MG tablet     Assessment & Plan Palpitations due to premature atrial complexes and brief atrial tachycardia Intermittent palpitations characterized by rapid and pounding heartbeats, primarily in the morning after weekends. Episodes last 3-4 minutes, associated with dietary changes, particularly increased sugar intake. Monitor showed brief atrial tachycardia and PACs, not atrial fibrillation. Possible contributing factors include stress, lack of sleep, and dietary habits. Symptoms are not considered dangerous. - Initiate metoprolol  succinate 50 mg in the evening to manage palpitations and elevated blood pressure. - Advise  monitoring for persistent symptoms and to contact if symptoms worsen.  Essential hypertension with intermittent peripheral edema Blood pressure readings elevated, with recent measurements at home showing 142/92 mmHg. Peripheral edema noted, particularly in the left ankle, associated with dietary sodium intake. Current regimen includes telmisartan HCT, which has improved edema. Blood pressure control is suboptimal, possibly due to dietary habits and medication adherence issues. - Continue telmisartan HCT 80/25 mg once daily in the morning. - Reinforce dietary modifications to reduce sodium intake and monitor for edema. - Monitor blood pressure regularly and adjust lifestyle as needed. - Addition of metoprolol  will hopefully bring blood pressure to goal at <130/80 mmHg.  Hypercholesterolemia with elevated coronary calcium  score in the 85th percentile on 01/11/2022 Cholesterol levels have increased, with LDL at 114 mg/dL and non-HDL cholesterol at 116 mg/dL, up from previous  levels. Atorvastatin  was prescribed for elevated coronary calcium  score and prevention of cardiovascular events. Non-adherence to medication regimen noted, contributing to elevated cholesterol levels. - Continue atorvastatin  as prescribed and emphasize adherence to medication regimen. - Recheck cholesterol levels with primary care provider in January. - Consider adding ezetimibe if cholesterol remains elevated.  Goal LDL <70.  Weight gain contributing to cardiovascular risk Weight gain noted, with current weight at 153 lbs, up from 133 lbs. Contributing factors include dietary habits and anastrozole  therapy. Weight gain is impacting blood pressure and cholesterol levels, increasing cardiovascular risk. - Encourage weight loss through dietary modifications and increased physical activity. - Discuss potential impact of anastrozole  on weight and explore strategies to mitigate weight gain.   Follow up: As needed.  Patient will follow-up with PCP for prescription refills as well. Complex patient, involved review of her charts, review of her external labs, including BMP, CBC, lipids and TSH, medication reconciliation and counseling.  Signed,  Gordy Bergamo, MD, [facility] 08/25/2024, 6:37 PM [facility] [address] [place], [place] [address] Phone: [phone]. Fax:  [phone]

## 2024-08-25 NOTE — Patient Instructions (Signed)
 Medication Instructions:  START Telmisartan-hydrochlorothiazide  (Micardis HCT) 80-25 mg - Take 1 tablet by mouth daily  START Metoprolol  succinate 50 mg - Take 1 tablet (50 mg total) by mouth every evening. Take with or immediately following a meal   Refilled Lipitor 20 mg - Take 1 tablet (20 mg total) by mouth daily   STOP hydrochlorothiazide   STOP telmisartan -hydrochlorothiazide  80-12.5 mg *If you need a refill on your cardiac medications before your next appointment, please call your pharmacy*  Lab Work: NONE If you have labs (blood work) drawn today and your tests are completely normal, you will receive your results only by: MyChart Message (if you have MyChart) OR A paper copy in the mail If you have any lab test that is abnormal or we need to change your treatment, we will call you to review the results.  Testing/Procedures: NONE  Follow-Up: At [facility], you and your health needs are our priority.  As part of our continuing mission to provide you with exceptional heart care, our providers are all part of one team.  This team includes your primary Cardiologist (physician) and Advanced Practice Providers or APPs (Physician Assistants and Nurse Practitioners) who all work together to provide you with the care you need, when you need it.  Your next appointment:   AS NEEDED   Provider:   Gordy Bergamo, MD    We recommend signing up for the patient portal called MyChart.  Sign up information is provided on this After Visit Summary.  MyChart is used to connect with patients for Virtual Visits (Telemedicine).  Patients are able to view lab/test results, encounter notes, upcoming appointments, etc.  Non-urgent messages can be sent to your provider as well.   To learn more about what you can do with MyChart, go to [email or web address].

## 2024-08-27 ENCOUNTER — Ambulatory Visit: Admitting: Cardiology

## 2024-09-06 DIAGNOSIS — Z23 Encounter for immunization: Secondary | ICD-10-CM | POA: Diagnosis not present

## 2024-12-02 ENCOUNTER — Other Ambulatory Visit: Payer: Self-pay | Admitting: Cardiology

## 2024-12-02 DIAGNOSIS — I10 Essential (primary) hypertension: Secondary | ICD-10-CM

## 2024-12-02 DIAGNOSIS — E7800 Pure hypercholesterolemia, unspecified: Secondary | ICD-10-CM

## 2024-12-02 DIAGNOSIS — R931 Abnormal findings on diagnostic imaging of heart and coronary circulation: Secondary | ICD-10-CM

## 2024-12-28 ENCOUNTER — Inpatient Hospital Stay

## 2024-12-28 ENCOUNTER — Inpatient Hospital Stay: Admitting: Hematology
